# Patient Record
Sex: Male | Born: 1965 | Race: White | Hispanic: No | Marital: Married | Smoking: Former smoker
Health system: Southern US, Community
[De-identification: ages and names within clinical notes are randomized; demographics above are authoritative.]

## PROBLEM LIST (undated history)

## (undated) DIAGNOSIS — D638 Anemia in other chronic diseases classified elsewhere: Secondary | ICD-10-CM

## (undated) DIAGNOSIS — L8990 Pressure ulcer of unspecified site, unspecified stage: Secondary | ICD-10-CM

## (undated) DIAGNOSIS — F329 Major depressive disorder, single episode, unspecified: Secondary | ICD-10-CM

## (undated) DIAGNOSIS — J869 Pyothorax without fistula: Secondary | ICD-10-CM

## (undated) DIAGNOSIS — J852 Abscess of lung without pneumonia: Secondary | ICD-10-CM

## (undated) DIAGNOSIS — F32A Depression, unspecified: Secondary | ICD-10-CM

## (undated) DIAGNOSIS — I10 Essential (primary) hypertension: Secondary | ICD-10-CM

## (undated) DIAGNOSIS — S069X9A Unspecified intracranial injury with loss of consciousness of unspecified duration, initial encounter: Secondary | ICD-10-CM

## (undated) DIAGNOSIS — G8220 Paraplegia, unspecified: Secondary | ICD-10-CM

## (undated) HISTORY — PX: CHOLECYSTECTOMY: SHX55

## (undated) HISTORY — PX: COLOSTOMY: SHX63

---

## 2015-07-22 DIAGNOSIS — G8220 Paraplegia, unspecified: Secondary | ICD-10-CM

## 2015-07-22 DIAGNOSIS — S069XAA Unspecified intracranial injury with loss of consciousness status unknown, initial encounter: Secondary | ICD-10-CM

## 2015-07-22 DIAGNOSIS — S069X9A Unspecified intracranial injury with loss of consciousness of unspecified duration, initial encounter: Secondary | ICD-10-CM

## 2015-07-22 HISTORY — DX: Unspecified intracranial injury with loss of consciousness of unspecified duration, initial encounter: S06.9X9A

## 2015-07-22 HISTORY — DX: Unspecified intracranial injury with loss of consciousness status unknown, initial encounter: S06.9XAA

## 2015-07-22 HISTORY — DX: Paraplegia, unspecified: G82.20

## 2015-07-30 ENCOUNTER — Emergency Department (HOSPITAL_COMMUNITY): Payer: BLUE CROSS/BLUE SHIELD

## 2015-07-30 ENCOUNTER — Encounter (HOSPITAL_COMMUNITY): Payer: Self-pay | Admitting: Radiology

## 2015-07-30 ENCOUNTER — Inpatient Hospital Stay (HOSPITAL_COMMUNITY)
Admission: EM | Admit: 2015-07-30 | Discharge: 2015-09-20 | DRG: 003 | Disposition: A | Discharge status: Skilled Nursing Facility | Payer: BLUE CROSS/BLUE SHIELD | Attending: General Surgery | Admitting: General Surgery

## 2015-07-30 ENCOUNTER — Inpatient Hospital Stay (HOSPITAL_COMMUNITY): Payer: BLUE CROSS/BLUE SHIELD

## 2015-07-30 DIAGNOSIS — J9621 Acute and chronic respiratory failure with hypoxia: Secondary | ICD-10-CM | POA: Diagnosis present

## 2015-07-30 DIAGNOSIS — G9611 Dural tear: Secondary | ICD-10-CM | POA: Diagnosis present

## 2015-07-30 DIAGNOSIS — S32048A Other fracture of fourth lumbar vertebra, initial encounter for closed fracture: Secondary | ICD-10-CM | POA: Diagnosis present

## 2015-07-30 DIAGNOSIS — R339 Retention of urine, unspecified: Secondary | ICD-10-CM | POA: Diagnosis not present

## 2015-07-30 DIAGNOSIS — S0282XA Fracture of other specified skull and facial bones, left side, initial encounter for closed fracture: Secondary | ICD-10-CM | POA: Diagnosis present

## 2015-07-30 DIAGNOSIS — S32029A Unspecified fracture of second lumbar vertebra, initial encounter for closed fracture: Secondary | ICD-10-CM | POA: Diagnosis present

## 2015-07-30 DIAGNOSIS — S2243XA Multiple fractures of ribs, bilateral, initial encounter for closed fracture: Secondary | ICD-10-CM | POA: Diagnosis present

## 2015-07-30 DIAGNOSIS — F1010 Alcohol abuse, uncomplicated: Secondary | ICD-10-CM | POA: Diagnosis present

## 2015-07-30 DIAGNOSIS — E876 Hypokalemia: Secondary | ICD-10-CM | POA: Diagnosis not present

## 2015-07-30 DIAGNOSIS — I4891 Unspecified atrial fibrillation: Secondary | ICD-10-CM | POA: Diagnosis not present

## 2015-07-30 DIAGNOSIS — S37012A Minor contusion of left kidney, initial encounter: Secondary | ICD-10-CM | POA: Diagnosis present

## 2015-07-30 DIAGNOSIS — I10 Essential (primary) hypertension: Secondary | ICD-10-CM | POA: Diagnosis not present

## 2015-07-30 DIAGNOSIS — B958 Unspecified staphylococcus as the cause of diseases classified elsewhere: Secondary | ICD-10-CM | POA: Diagnosis not present

## 2015-07-30 DIAGNOSIS — G8221 Paraplegia, complete: Secondary | ICD-10-CM

## 2015-07-30 DIAGNOSIS — S27322A Contusion of lung, bilateral, initial encounter: Secondary | ICD-10-CM | POA: Diagnosis present

## 2015-07-30 DIAGNOSIS — R569 Unspecified convulsions: Secondary | ICD-10-CM | POA: Diagnosis not present

## 2015-07-30 DIAGNOSIS — S22068A Other fracture of T7-T8 thoracic vertebra, initial encounter for closed fracture: Secondary | ICD-10-CM | POA: Diagnosis present

## 2015-07-30 DIAGNOSIS — S22089A Unspecified fracture of T11-T12 vertebra, initial encounter for closed fracture: Secondary | ICD-10-CM | POA: Diagnosis present

## 2015-07-30 DIAGNOSIS — R509 Fever, unspecified: Secondary | ICD-10-CM

## 2015-07-30 DIAGNOSIS — G8220 Paraplegia, unspecified: Secondary | ICD-10-CM | POA: Diagnosis not present

## 2015-07-30 DIAGNOSIS — S82145A Nondisplaced bicondylar fracture of left tibia, initial encounter for closed fracture: Secondary | ICD-10-CM | POA: Diagnosis present

## 2015-07-30 DIAGNOSIS — Z23 Encounter for immunization: Secondary | ICD-10-CM

## 2015-07-30 DIAGNOSIS — L89153 Pressure ulcer of sacral region, stage 3: Secondary | ICD-10-CM | POA: Diagnosis not present

## 2015-07-30 DIAGNOSIS — R402112 Coma scale, eyes open, never, at arrival to emergency department: Secondary | ICD-10-CM | POA: Diagnosis present

## 2015-07-30 DIAGNOSIS — S065X9A Traumatic subdural hemorrhage with loss of consciousness of unspecified duration, initial encounter: Secondary | ICD-10-CM | POA: Diagnosis present

## 2015-07-30 DIAGNOSIS — I609 Nontraumatic subarachnoid hemorrhage, unspecified: Secondary | ICD-10-CM | POA: Diagnosis present

## 2015-07-30 DIAGNOSIS — S32058A Other fracture of fifth lumbar vertebra, initial encounter for closed fracture: Secondary | ICD-10-CM | POA: Diagnosis present

## 2015-07-30 DIAGNOSIS — R739 Hyperglycemia, unspecified: Secondary | ICD-10-CM | POA: Diagnosis not present

## 2015-07-30 DIAGNOSIS — S8990XA Unspecified injury of unspecified lower leg, initial encounter: Secondary | ICD-10-CM

## 2015-07-30 DIAGNOSIS — J810 Acute pulmonary edema: Secondary | ICD-10-CM | POA: Diagnosis not present

## 2015-07-30 DIAGNOSIS — S01311A Laceration without foreign body of right ear, initial encounter: Secondary | ICD-10-CM | POA: Diagnosis present

## 2015-07-30 DIAGNOSIS — N280 Ischemia and infarction of kidney: Secondary | ICD-10-CM | POA: Diagnosis present

## 2015-07-30 DIAGNOSIS — S32038A Other fracture of third lumbar vertebra, initial encounter for closed fracture: Secondary | ICD-10-CM | POA: Diagnosis present

## 2015-07-30 DIAGNOSIS — S066X9A Traumatic subarachnoid hemorrhage with loss of consciousness of unspecified duration, initial encounter: Secondary | ICD-10-CM | POA: Diagnosis present

## 2015-07-30 DIAGNOSIS — S299XXA Unspecified injury of thorax, initial encounter: Secondary | ICD-10-CM

## 2015-07-30 DIAGNOSIS — R4020 Unspecified coma: Secondary | ICD-10-CM | POA: Diagnosis present

## 2015-07-30 DIAGNOSIS — N179 Acute kidney failure, unspecified: Secondary | ICD-10-CM | POA: Diagnosis present

## 2015-07-30 DIAGNOSIS — G9340 Encephalopathy, unspecified: Secondary | ICD-10-CM | POA: Diagnosis not present

## 2015-07-30 DIAGNOSIS — J9601 Acute respiratory failure with hypoxia: Secondary | ICD-10-CM | POA: Diagnosis not present

## 2015-07-30 DIAGNOSIS — S32012A Unstable burst fracture of first lumbar vertebra, initial encounter for closed fracture: Secondary | ICD-10-CM | POA: Diagnosis present

## 2015-07-30 DIAGNOSIS — T1490XA Injury, unspecified, initial encounter: Secondary | ICD-10-CM

## 2015-07-30 DIAGNOSIS — R251 Tremor, unspecified: Secondary | ICD-10-CM | POA: Diagnosis not present

## 2015-07-30 DIAGNOSIS — G839 Paralytic syndrome, unspecified: Secondary | ICD-10-CM | POA: Diagnosis present

## 2015-07-30 DIAGNOSIS — S0240FA Zygomatic fracture, left side, initial encounter for closed fracture: Secondary | ICD-10-CM | POA: Diagnosis present

## 2015-07-30 DIAGNOSIS — Z9289 Personal history of other medical treatment: Secondary | ICD-10-CM

## 2015-07-30 DIAGNOSIS — S36039A Unspecified laceration of spleen, initial encounter: Secondary | ICD-10-CM | POA: Diagnosis present

## 2015-07-30 DIAGNOSIS — F10239 Alcohol dependence with withdrawal, unspecified: Secondary | ICD-10-CM | POA: Diagnosis present

## 2015-07-30 DIAGNOSIS — Z4659 Encounter for fitting and adjustment of other gastrointestinal appliance and device: Secondary | ICD-10-CM

## 2015-07-30 DIAGNOSIS — F17210 Nicotine dependence, cigarettes, uncomplicated: Secondary | ICD-10-CM | POA: Diagnosis present

## 2015-07-30 DIAGNOSIS — S34101A Unspecified injury to L1 level of lumbar spinal cord, initial encounter: Secondary | ICD-10-CM | POA: Diagnosis present

## 2015-07-30 DIAGNOSIS — S82143A Displaced bicondylar fracture of unspecified tibia, initial encounter for closed fracture: Secondary | ICD-10-CM

## 2015-07-30 DIAGNOSIS — S12500A Unspecified displaced fracture of sixth cervical vertebra, initial encounter for closed fracture: Secondary | ICD-10-CM | POA: Diagnosis present

## 2015-07-30 DIAGNOSIS — R402212 Coma scale, best verbal response, none, at arrival to emergency department: Secondary | ICD-10-CM | POA: Diagnosis present

## 2015-07-30 DIAGNOSIS — S22048A Other fracture of fourth thoracic vertebra, initial encounter for closed fracture: Secondary | ICD-10-CM | POA: Diagnosis present

## 2015-07-30 DIAGNOSIS — S0292XA Unspecified fracture of facial bones, initial encounter for closed fracture: Secondary | ICD-10-CM | POA: Diagnosis present

## 2015-07-30 DIAGNOSIS — E875 Hyperkalemia: Secondary | ICD-10-CM | POA: Diagnosis not present

## 2015-07-30 DIAGNOSIS — S34109A Unspecified injury to unspecified level of lumbar spinal cord, initial encounter: Secondary | ICD-10-CM

## 2015-07-30 DIAGNOSIS — S069XAA Unspecified intracranial injury with loss of consciousness status unknown, initial encounter: Secondary | ICD-10-CM | POA: Diagnosis present

## 2015-07-30 DIAGNOSIS — L8990 Pressure ulcer of unspecified site, unspecified stage: Secondary | ICD-10-CM | POA: Diagnosis not present

## 2015-07-30 DIAGNOSIS — N390 Urinary tract infection, site not specified: Secondary | ICD-10-CM | POA: Diagnosis not present

## 2015-07-30 DIAGNOSIS — Z930 Tracheostomy status: Secondary | ICD-10-CM

## 2015-07-30 DIAGNOSIS — S12501A Unspecified nondisplaced fracture of sixth cervical vertebra, initial encounter for closed fracture: Secondary | ICD-10-CM | POA: Diagnosis present

## 2015-07-30 DIAGNOSIS — R402312 Coma scale, best motor response, none, at arrival to emergency department: Secondary | ICD-10-CM | POA: Diagnosis present

## 2015-07-30 DIAGNOSIS — T794XXA Traumatic shock, initial encounter: Secondary | ICD-10-CM | POA: Diagnosis present

## 2015-07-30 DIAGNOSIS — R0989 Other specified symptoms and signs involving the circulatory and respiratory systems: Secondary | ICD-10-CM

## 2015-07-30 DIAGNOSIS — S069X6A Unspecified intracranial injury with loss of consciousness greater than 24 hours without return to pre-existing conscious level with patient surviving, initial encounter: Secondary | ICD-10-CM | POA: Diagnosis not present

## 2015-07-30 DIAGNOSIS — Z419 Encounter for procedure for purposes other than remedying health state, unspecified: Secondary | ICD-10-CM

## 2015-07-30 DIAGNOSIS — S32009A Unspecified fracture of unspecified lumbar vertebra, initial encounter for closed fracture: Secondary | ICD-10-CM | POA: Diagnosis present

## 2015-07-30 DIAGNOSIS — J9600 Acute respiratory failure, unspecified whether with hypoxia or hypercapnia: Secondary | ICD-10-CM | POA: Diagnosis present

## 2015-07-30 DIAGNOSIS — S069X9A Unspecified intracranial injury with loss of consciousness of unspecified duration, initial encounter: Secondary | ICD-10-CM

## 2015-07-30 DIAGNOSIS — S22008A Other fracture of unspecified thoracic vertebra, initial encounter for closed fracture: Secondary | ICD-10-CM | POA: Diagnosis present

## 2015-07-30 DIAGNOSIS — L89154 Pressure ulcer of sacral region, stage 4: Secondary | ICD-10-CM | POA: Diagnosis not present

## 2015-07-30 DIAGNOSIS — S32028A Other fracture of second lumbar vertebra, initial encounter for closed fracture: Secondary | ICD-10-CM | POA: Diagnosis present

## 2015-07-30 DIAGNOSIS — S0101XA Laceration without foreign body of scalp, initial encounter: Secondary | ICD-10-CM | POA: Diagnosis present

## 2015-07-30 DIAGNOSIS — S82142A Displaced bicondylar fracture of left tibia, initial encounter for closed fracture: Secondary | ICD-10-CM | POA: Diagnosis present

## 2015-07-30 DIAGNOSIS — Z09 Encounter for follow-up examination after completed treatment for conditions other than malignant neoplasm: Secondary | ICD-10-CM

## 2015-07-30 DIAGNOSIS — E870 Hyperosmolality and hypernatremia: Secondary | ICD-10-CM | POA: Diagnosis not present

## 2015-07-30 DIAGNOSIS — S36030A Superficial (capsular) laceration of spleen, initial encounter: Secondary | ICD-10-CM | POA: Diagnosis present

## 2015-07-30 DIAGNOSIS — J9690 Respiratory failure, unspecified, unspecified whether with hypoxia or hypercapnia: Secondary | ICD-10-CM

## 2015-07-30 DIAGNOSIS — D62 Acute posthemorrhagic anemia: Secondary | ICD-10-CM | POA: Diagnosis not present

## 2015-07-30 DIAGNOSIS — J690 Pneumonitis due to inhalation of food and vomit: Secondary | ICD-10-CM | POA: Diagnosis not present

## 2015-07-30 DIAGNOSIS — Z01818 Encounter for other preprocedural examination: Secondary | ICD-10-CM

## 2015-07-30 DIAGNOSIS — S270XXA Traumatic pneumothorax, initial encounter: Secondary | ICD-10-CM | POA: Diagnosis present

## 2015-07-30 DIAGNOSIS — S22078A Other fracture of T9-T10 vertebra, initial encounter for closed fracture: Secondary | ICD-10-CM | POA: Diagnosis present

## 2015-07-30 DIAGNOSIS — J189 Pneumonia, unspecified organism: Secondary | ICD-10-CM | POA: Diagnosis not present

## 2015-07-30 DIAGNOSIS — A498 Other bacterial infections of unspecified site: Secondary | ICD-10-CM | POA: Diagnosis not present

## 2015-07-30 LAB — I-STAT CHEM 8, ED
BUN: 10 mg/dL (ref 6–20)
CALCIUM ION: 0.95 mmol/L — AB (ref 1.12–1.23)
Chloride: 102 mmol/L (ref 101–111)
Creatinine, Ser: 1.2 mg/dL (ref 0.61–1.24)
GLUCOSE: 141 mg/dL — AB (ref 65–99)
HCT: 49 % (ref 39.0–52.0)
HEMOGLOBIN: 16.7 g/dL (ref 13.0–17.0)
POTASSIUM: 3.3 mmol/L — AB (ref 3.5–5.1)
Sodium: 136 mmol/L (ref 135–145)
TCO2: 18 mmol/L (ref 0–100)

## 2015-07-30 LAB — ABO/RH: ABO/RH(D): A POS

## 2015-07-30 LAB — I-STAT ARTERIAL BLOOD GAS, ED
ACID-BASE DEFICIT: 11 mmol/L — AB (ref 0.0–2.0)
Acid-base deficit: 13 mmol/L — ABNORMAL HIGH (ref 0.0–2.0)
BICARBONATE: 14.3 meq/L — AB (ref 20.0–24.0)
Bicarbonate: 17.6 meq/L — ABNORMAL LOW (ref 20.0–24.0)
O2 SAT: 100 %
O2 Saturation: 99 %
PCO2 ART: 46.6 mmHg — AB (ref 35.0–45.0)
PH ART: 7.175 — AB (ref 7.350–7.450)
PH ART: 7.221 — AB (ref 7.350–7.450)
Patient temperature: 95.2
TCO2: 19 mmol/L (ref 0–100)
TCO2: 15 mmol/L (ref 0–100)
pCO2 arterial: 34 mmHg — ABNORMAL LOW (ref 35.0–45.0)
pO2, Arterial: 257 mmHg — ABNORMAL HIGH (ref 80.0–100.0)
pO2, Arterial: 173 mmHg — ABNORMAL HIGH (ref 80.0–100.0)

## 2015-07-30 LAB — BLOOD PRODUCT ORDER (VERBAL) VERIFICATION

## 2015-07-30 LAB — I-STAT CG4 LACTIC ACID, ED: LACTIC ACID, VENOUS: 3.61 mmol/L — AB (ref 0.5–2.0)

## 2015-07-30 LAB — PREPARE RBC (CROSSMATCH)

## 2015-07-30 LAB — I-STAT TROPONIN, ED: Troponin i, poc: 0.01 ng/mL (ref 0.00–0.08)

## 2015-07-30 LAB — MRSA PCR SCREENING: MRSA BY PCR: NEGATIVE

## 2015-07-30 LAB — CDS SEROLOGY

## 2015-07-30 MED ORDER — FENTANYL CITRATE (PF) 100 MCG/2ML IJ SOLN
100.0000 ug | INTRAMUSCULAR | Status: DC | PRN
  Administered 2015-07-30: 100 ug via INTRAVENOUS
  Filled 2015-07-30: qty 2

## 2015-07-30 MED ORDER — FENTANYL CITRATE (PF) 100 MCG/2ML IJ SOLN
INTRAMUSCULAR | Status: AC
  Administered 2015-07-30: 25 ug
  Filled 2015-07-30: qty 2

## 2015-07-30 MED ORDER — SODIUM CHLORIDE 0.9 % IV SOLN: 10.0000 mL/h | Freq: Once | INTRAVENOUS | Status: DC

## 2015-07-30 MED ORDER — TETANUS-DIPHTH-ACELL PERTUSSIS 5-2.5-18.5 LF-MCG/0.5 IM SUSP
INTRAMUSCULAR | Status: AC
  Filled 2015-07-30: qty 0.5

## 2015-07-30 MED ORDER — ANTISEPTIC ORAL RINSE SOLUTION (CORINZ)
7.0000 mL | OROMUCOSAL | Status: DC
  Administered 2015-07-30 – 2015-09-20 (×478): 7 mL via OROMUCOSAL

## 2015-07-30 MED ORDER — SODIUM CHLORIDE 0.9 % IV SOLN
0.0000 ug/min | INTRAVENOUS | Status: DC
  Administered 2015-07-30: 125 ug/min via INTRAVENOUS
  Administered 2015-07-30: 100 ug/min via INTRAVENOUS
  Administered 2015-07-31 – 2015-08-01 (×2): 40 ug/min via INTRAVENOUS
  Administered 2015-08-02: 15 ug/min via INTRAVENOUS
  Filled 2015-07-30 (×5): qty 4

## 2015-07-30 MED ORDER — ONDANSETRON HCL 4 MG PO TABS: 4.0000 mg | ORAL_TABLET | Freq: Four times a day (QID) | ORAL | Status: DC | PRN

## 2015-07-30 MED ORDER — FENTANYL CITRATE (PF) 100 MCG/2ML IJ SOLN
INTRAMUSCULAR | Status: AC
  Filled 2015-07-30: qty 2

## 2015-07-30 MED ORDER — MIDAZOLAM HCL 2 MG/2ML IJ SOLN
INTRAMUSCULAR | Status: AC
  Filled 2015-07-30: qty 4

## 2015-07-30 MED ORDER — PROPOFOL 1000 MG/100ML IV EMUL
5.0000 ug/kg/min | Freq: Once | INTRAVENOUS | Status: AC
Start: 2015-07-30 — End: 2015-07-30
  Administered 2015-07-30: 5 ug/kg/min via INTRAVENOUS

## 2015-07-30 MED ORDER — TETANUS-DIPHTH-ACELL PERTUSSIS 5-2.5-18.5 LF-MCG/0.5 IM SUSP
0.5000 mL | Freq: Once | INTRAMUSCULAR | Status: AC
  Administered 2015-07-30: 0.5 mL via INTRAMUSCULAR

## 2015-07-30 MED ORDER — SODIUM CHLORIDE 0.9% FLUSH
10.0000 mL | Freq: Two times a day (BID) | INTRAVENOUS | Status: DC
  Administered 2015-07-30 (×2): 10 mL
  Administered 2015-07-31: 20 mL
  Administered 2015-08-01 – 2015-08-03 (×5): 10 mL
  Administered 2015-08-04: 20 mL
  Administered 2015-08-04: 10 mL
  Administered 2015-08-05: 40 mL
  Administered 2015-08-06: 10 mL
  Administered 2015-08-06: 40 mL
  Administered 2015-08-07 – 2015-08-12 (×12): 10 mL
  Administered 2015-08-13: 30 mL
  Administered 2015-08-14: 10 mL
  Administered 2015-08-14: 20 mL
  Administered 2015-08-15 – 2015-08-18 (×7): 10 mL
  Administered 2015-08-18: 30 mL
  Administered 2015-08-19: 40 mL
  Administered 2015-08-19 – 2015-08-20 (×2): 10 mL
  Administered 2015-08-20 – 2015-08-21 (×2): 30 mL
  Administered 2015-08-21 – 2015-08-25 (×8): 10 mL
  Administered 2015-08-26: 20 mL
  Administered 2015-08-26: 10 mL
  Administered 2015-08-27: 20 mL
  Administered 2015-08-27 – 2015-08-29 (×5): 10 mL
  Administered 2015-08-30: 30 mL
  Administered 2015-08-30: 10 mL
  Administered 2015-08-31: 30 mL
  Administered 2015-08-31 – 2015-09-18 (×19): 10 mL

## 2015-07-30 MED ORDER — CHLORHEXIDINE GLUCONATE 0.12% ORAL RINSE (MEDLINE KIT)
15.0000 mL | Freq: Two times a day (BID) | OROMUCOSAL | Status: DC
  Administered 2015-07-30 – 2015-09-01 (×66): 15 mL via OROMUCOSAL

## 2015-07-30 MED ORDER — PHENYLEPHRINE HCL 10 MG/ML IJ SOLN
INTRAMUSCULAR | Status: AC | PRN
  Administered 2015-07-30: 80 ug
  Administered 2015-07-30: 40 ug

## 2015-07-30 MED ORDER — SODIUM CHLORIDE 0.9 % IV BOLUS (SEPSIS)
1000.0000 mL | Freq: Once | INTRAVENOUS | Status: AC
  Administered 2015-07-30: 1000 mL via INTRAVENOUS

## 2015-07-30 MED ORDER — MIDAZOLAM HCL 2 MG/2ML IJ SOLN
2.0000 mg | INTRAMUSCULAR | Status: DC | PRN
  Administered 2015-08-01 – 2015-08-17 (×18): 2 mg via INTRAVENOUS
  Filled 2015-07-30 (×21): qty 2

## 2015-07-30 MED ORDER — PROPOFOL 1000 MG/100ML IV EMUL
INTRAVENOUS | Status: AC | PRN
  Administered 2015-07-30: 5 ug/kg/min via INTRAVENOUS

## 2015-07-30 MED ORDER — LIDOCAINE-EPINEPHRINE 2 %-1:200000 IJ SOLN
INTRAMUSCULAR | Status: AC
Start: 2015-07-30 — End: 2015-07-30
  Filled 2015-07-30: qty 20

## 2015-07-30 MED ORDER — MIDAZOLAM HCL 2 MG/2ML IJ SOLN
2.0000 mg | INTRAMUSCULAR | Status: AC | PRN
  Administered 2015-07-30 – 2015-08-02 (×3): 2 mg via INTRAVENOUS
  Filled 2015-07-30 (×3): qty 2

## 2015-07-30 MED ORDER — SODIUM CHLORIDE 0.9 % IV SOLN
INTRAVENOUS | Status: AC | PRN
  Administered 2015-07-30 (×3): 1000 mL via INTRAVENOUS

## 2015-07-30 MED ORDER — FENTANYL CITRATE (PF) 100 MCG/2ML IJ SOLN
100.0000 ug | INTRAMUSCULAR | Status: AC | PRN
  Administered 2015-07-30 – 2015-07-31 (×3): 100 ug via INTRAVENOUS
  Filled 2015-07-30 (×3): qty 2

## 2015-07-30 MED ORDER — ONDANSETRON HCL 4 MG/2ML IJ SOLN
4.0000 mg | Freq: Four times a day (QID) | INTRAMUSCULAR | Status: DC | PRN
  Administered 2015-08-23 – 2015-09-12 (×2): 4 mg via INTRAVENOUS
  Filled 2015-07-30 (×2): qty 2

## 2015-07-30 MED ORDER — FENTANYL CITRATE (PF) 100 MCG/2ML IJ SOLN: 100.0000 ug | INTRAMUSCULAR | Status: DC | PRN

## 2015-07-30 MED ORDER — SODIUM CHLORIDE 0.9 % IV SOLN
INTRAVENOUS | Status: DC
  Administered 2015-07-30 – 2015-08-02 (×6): via INTRAVENOUS
  Administered 2015-08-02: 1000 mL via INTRAVENOUS
  Administered 2015-08-03 (×2): via INTRAVENOUS
  Administered 2015-08-04: 1000 mL via INTRAVENOUS
  Administered 2015-08-04: 10:00:00 via INTRAVENOUS

## 2015-07-30 MED ORDER — DEXMEDETOMIDINE HCL IN NACL 200 MCG/50ML IV SOLN
0.2000 ug/kg/h | INTRAVENOUS | Status: AC
  Administered 2015-07-30 (×2): 0.7 ug/kg/h via INTRAVENOUS
  Administered 2015-07-30: 0.2 ug/kg/h via INTRAVENOUS
  Administered 2015-07-30 – 2015-07-31 (×6): 0.7 ug/kg/h via INTRAVENOUS
  Filled 2015-07-30 (×10): qty 50

## 2015-07-30 MED ORDER — ACETAMINOPHEN 650 MG RE SUPP
650.0000 mg | Freq: Four times a day (QID) | RECTAL | Status: DC | PRN
  Administered 2015-07-30: 650 mg via RECTAL
  Filled 2015-07-30: qty 1

## 2015-07-30 MED ORDER — SODIUM CHLORIDE 0.9% FLUSH
10.0000 mL | INTRAVENOUS | Status: DC | PRN
  Administered 2015-09-02 – 2015-09-06 (×4): 20 mL
  Administered 2015-09-11: 10 mL
  Administered 2015-09-14 – 2015-09-18 (×4): 20 mL
  Administered 2015-09-19: 30 mL
  Filled 2015-07-30 (×10): qty 40

## 2015-07-30 MED ORDER — FENTANYL CITRATE (PF) 100 MCG/2ML IJ SOLN
50.0000 ug | Freq: Once | INTRAMUSCULAR | Status: AC
  Administered 2015-07-30: 50 ug via INTRAVENOUS

## 2015-07-30 MED ORDER — PROPOFOL 1000 MG/100ML IV EMUL
INTRAVENOUS | Status: AC
  Administered 2015-07-30: 5 ug/kg/min via INTRAVENOUS
  Filled 2015-07-30: qty 100

## 2015-07-30 MED ORDER — SODIUM CHLORIDE 0.9 % IV SOLN
1.0000 g | Freq: Once | INTRAVENOUS | Status: AC
  Administered 2015-07-30: 1 g via INTRAVENOUS
  Filled 2015-07-30: qty 10

## 2015-07-30 MED ORDER — PANTOPRAZOLE SODIUM 40 MG PO TBEC
40.0000 mg | DELAYED_RELEASE_TABLET | Freq: Every day | ORAL | Status: DC
  Administered 2015-08-12 – 2015-08-20 (×4): 40 mg via ORAL
  Filled 2015-07-30 (×5): qty 1

## 2015-07-30 MED ORDER — ROCURONIUM BROMIDE 50 MG/5ML IV SOLN
INTRAVENOUS | Status: AC | PRN
  Administered 2015-07-30: 100 mg via INTRAVENOUS

## 2015-07-30 MED ORDER — NOREPINEPHRINE BITARTRATE 1 MG/ML IV SOLN
2.0000 ug/min | INTRAVENOUS | Status: DC
  Filled 2015-07-30: qty 4

## 2015-07-30 MED ORDER — IOPAMIDOL (ISOVUE-300) INJECTION 61%
INTRAVENOUS | Status: AC
  Filled 2015-07-30: qty 100

## 2015-07-30 MED ORDER — MIDAZOLAM HCL 5 MG/5ML IJ SOLN
INTRAMUSCULAR | Status: AC | PRN
  Administered 2015-07-30: 4 mg via INTRAVENOUS

## 2015-07-30 MED ORDER — MIDAZOLAM HCL 2 MG/2ML IJ SOLN
INTRAMUSCULAR | Status: AC
Start: 2015-07-30 — End: 2015-07-30
  Administered 2015-07-30: 2 mg
  Filled 2015-07-30: qty 2

## 2015-07-30 MED ORDER — PANTOPRAZOLE SODIUM 40 MG IV SOLR
40.0000 mg | Freq: Every day | INTRAVENOUS | Status: DC
  Administered 2015-07-30 – 2015-08-18 (×18): 40 mg via INTRAVENOUS
  Filled 2015-07-30 (×18): qty 40

## 2015-07-30 MED ORDER — SODIUM CHLORIDE 0.9 % IV SOLN
0.0000 ug/min | INTRAVENOUS | Status: DC
  Administered 2015-07-30: 180 ug/min via INTRAVENOUS
  Administered 2015-07-30: 20 ug/min via INTRAVENOUS
  Filled 2015-07-30 (×3): qty 1

## 2015-07-30 MED ORDER — FENTANYL CITRATE (PF) 100 MCG/2ML IJ SOLN
100.0000 ug | INTRAMUSCULAR | Status: DC | PRN
  Administered 2015-07-31 – 2015-08-14 (×62): 100 ug via INTRAVENOUS
  Administered 2015-08-15: 150 ug via INTRAVENOUS
  Administered 2015-08-15 (×3): 50 ug via INTRAVENOUS
  Administered 2015-08-15 – 2015-08-21 (×28): 100 ug via INTRAVENOUS
  Administered 2015-08-21: 50 ug via INTRAVENOUS
  Administered 2015-08-22 – 2015-08-31 (×36): 100 ug via INTRAVENOUS
  Filled 2015-07-30 (×131): qty 2

## 2015-07-30 NOTE — ED Notes (Signed)
See trauma narrator 
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
## 2015-07-30 NOTE — Progress Notes (Signed)
Patient ID: [name], unknown   DOB: October 23, 1965, 50 y.o.   MRN: [id number] I spoke with his wife at the bedside. I updated her on the plan of care and answered her questions. Violeta GelinasBurke Naveed Humphres, MD, MPH, FACS Trauma: [phone] General Surgery: [phone]
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
## 2015-07-30 NOTE — Progress Notes (Signed)
Arterial Catheter Insertion Procedure Note William NordmannDonald Wayne Logan [id number] April 18, 1966  Procedure: Insertion of Arterial Catheter  Indications: Blood pressure monitoring RT unable to thread a line in radial artery so femoral a line placed.  Procedure Details Consent: Unable to obtain consent because of emergent medical necessity. Time Out: Verified patient identification, verified procedure, site/side was marked, verified correct patient position, special equipment/implants available, medications/allergies/relevent history reviewed, required imaging and test results available.  Performed  Maximum sterile technique was used including antiseptics, cap, gloves, hand hygiene, mask and sheet. Skin prep: Chlorhexidine; 20 gauge catheter was inserted into left femoral artery using the Seldinger technique.  Evaluation Blood flow good; BP tracing good. Complications: No apparent complications.  William SellaEric M. William CampanileWilson, MD, FACS General, Bariatric, & Minimally Invasive Surgery [facility], [place]   [facility] HospitalWILSON,[name] 07/30/2015
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
## 2015-07-30 NOTE — Progress Notes (Signed)
Dr. Magnus IvanBlackman notified of spike and subsequent drop of patient's BP. Orders received for CBC and liter bolus. Will monitor.
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
## 2015-07-30 NOTE — Progress Notes (Signed)
Orthopedic Tech Progress Note Patient Details:  Julien NordmannDonald Wayne Dominy [birth year] [id number]  Ortho Devices Type of Ortho Device: Knee Immobilizer Ortho Device/Splint Interventions: Application   Saul FordyceJennifer C Marcelina Mclaurin 07/30/2015, 1:42 PM

## 2015-07-30 NOTE — Consult Note (Signed)
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 FLAGS AND/OR RANGES MODIFIED BY DEMOGRAPHIC UPDATE ON 04/09 AT 0311 QA FLAGS AND/OR RANGES MODIFIED BY DEMOGRAPHIC UPDATE ON 04/09 AT 0437 QA FLAGS AND/OR RANGES MODIFIED BY DEMOGRAPHIC UPDATE ON 04/09 AT 0510 QA FLAGS AND/OR RANGES MODIFIED BY DEMOGRAPHIC UPDATE ON 04/09 AT 0526    BUN 10 6 - 20 mg/dL    Comment: QA FLAGS AND/OR RANGES MODIFIED BY DEMOGRAPHIC UPDATE ON 04/09 AT 0245 QA FLAGS AND/OR RANGES MODIFIED BY DEMOGRAPHIC UPDATE ON 04/09 AT 0247 QA FLAGS AND/OR RANGES MODIFIED BY DEMOGRAPHIC UPDATE ON 04/09 AT 0311 QA FLAGS AND/OR RANGES MODIFIED BY DEMOGRAPHIC UPDATE ON 04/09 AT 0437 QA FLAGS AND/OR RANGES MODIFIED BY DEMOGRAPHIC UPDATE ON 04/09 AT 0510 QA FLAGS AND/OR RANGES MODIFIED BY DEMOGRAPHIC UPDATE ON 04/09 AT 0526    Creatinine, Ser 1.20 0.61 - 1.24 mg/dL    Comment: QA FLAGS AND/OR RANGES MODIFIED BY DEMOGRAPHIC UPDATE ON 04/09 AT 0245 QA FLAGS AND/OR RANGES MODIFIED BY  DEMOGRAPHIC UPDATE ON 04/09 AT 0247 QA FLAGS AND/OR RANGES MODIFIED BY DEMOGRAPHIC UPDATE ON 04/09 AT 0311 QA FLAGS AND/OR RANGES MODIFIED BY DEMOGRAPHIC UPDATE ON 04/09 AT 0437 QA FLAGS AND/OR RANGES MODIFIED BY DEMOGRAPHIC UPDATE ON 04/09 AT 0510 QA FLAGS AND/OR RANGES MODIFIED BY DEMOGRAPHIC UPDATE ON 04/09 AT 0526    Glucose, Bld 141 (H) 65 - 99 mg/dL    Comment: QA FLAGS AND/OR RANGES MODIFIED BY DEMOGRAPHIC UPDATE ON 04/09 AT 0245 QA FLAGS AND/OR RANGES MODIFIED BY DEMOGRAPHIC UPDATE ON 04/09 AT 0247 QA FLAGS AND/OR RANGES MODIFIED BY DEMOGRAPHIC UPDATE ON 04/09 AT 0311 QA FLAGS AND/OR RANGES MODIFIED BY DEMOGRAPHIC UPDATE ON 04/09 AT 0437 QA FLAGS AND/OR RANGES MODIFIED BY DEMOGRAPHIC UPDATE ON 04/09 AT 0510 QA FLAGS AND/OR RANGES MODIFIED BY DEMOGRAPHIC UPDATE ON 04/09 AT 0526    Calcium, Ion 0.95 (L) 1.12 - 1.23 mmol/L    Comment: QA FLAGS AND/OR RANGES MODIFIED BY DEMOGRAPHIC UPDATE ON 04/09 AT 4621 QA FLAGS AND/OR RANGES MODIFIED  BY DEMOGRAPHIC UPDATE ON 04/09 AT 0245 QA FLAGS AND/OR RANGES MODIFIED BY DEMOGRAPHIC UPDATE ON 04/09 AT 0247 QA FLAGS AND/OR RANGES MODIFIED BY DEMOGRAPHIC UPDATE ON 04/09 AT 0311 QA FLAGS AND/OR RANGES MODIFIED BY DEMOGRAPHIC UPDATE ON 04/09 AT 0437 QA FLAGS AND/OR RANGES MODIFIED BY DEMOGRAPHIC UPDATE ON 04/09 AT 0510 QA FLAGS AND/OR RANGES MODIFIED BY DEMOGRAPHIC UPDATE ON 04/09 AT 0526    TCO2 18 0 - 100 mmol/L    Comment: QA FLAGS AND/OR RANGES MODIFIED BY DEMOGRAPHIC UPDATE ON 04/09 AT 0245 QA FLAGS AND/OR RANGES MODIFIED BY DEMOGRAPHIC UPDATE ON 04/09 AT 0247 QA FLAGS AND/OR RANGES MODIFIED BY DEMOGRAPHIC UPDATE ON 04/09 AT 0311 QA FLAGS AND/OR RANGES MODIFIED BY DEMOGRAPHIC UPDATE ON 04/09 AT 0437 QA FLAGS AND/OR RANGES MODIFIED BY DEMOGRAPHIC UPDATE ON 04/09 AT 0510 QA FLAGS AND/OR RANGES MODIFIED BY DEMOGRAPHIC UPDATE ON 04/09 AT 0526    Hemoglobin 16.7 13.0 - 17.0 g/dL    Comment: QA FLAGS AND/OR RANGES MODIFIED BY DEMOGRAPHIC UPDATE ON  04/09 AT 0245 QA FLAGS AND/OR RANGES MODIFIED BY DEMOGRAPHIC UPDATE ON 04/09 AT 0247 QA FLAGS AND/OR RANGES MODIFIED BY DEMOGRAPHIC UPDATE ON 04/09 AT 0311 QA FLAGS AND/OR RANGES MODIFIED BY DEMOGRAPHIC UPDATE ON 04/09 AT 0437 QA FLAGS AND/OR RANGES MODIFIED BY DEMOGRAPHIC UPDATE ON 04/09 AT 0510 QA FLAGS AND/OR RANGES MODIFIED BY DEMOGRAPHIC UPDATE ON 04/09 AT 0526    HCT 49.0 39.0 - 52.0 %    Comment: QA FLAGS AND/OR RANGES MODIFIED BY DEMOGRAPHIC UPDATE ON 04/09 AT 0245 QA FLAGS AND/OR RANGES MODIFIED BY DEMOGRAPHIC UPDATE ON 04/09 AT 0247 QA FLAGS AND/OR RANGES MODIFIED BY DEMOGRAPHIC UPDATE ON 04/09 AT 0311 QA FLAGS AND/OR RANGES MODIFIED BY DEMOGRAPHIC UPDATE ON 04/09 AT 0437 QA FLAGS AND/OR RANGES MODIFIED BY DEMOGRAPHIC UPDATE ON 04/09 AT 0510 QA FLAGS AND/OR RANGES MODIFIED BY DEMOGRAPHIC UPDATE ON 04/09 AT 0526   I-Stat CG4 Lactic Acid, ED     Status: Abnormal   Collection Time: 07/30/15  1:57 AM  Result Value Ref Range   Lactic Acid, Venous 3.61 (HH) 0.5 - 2.0 mmol/L    Comment: QA FLAGS AND/OR RANGES MODIFIED BY DEMOGRAPHIC UPDATE ON 04/09 AT 0245 QA FLAGS AND/OR RANGES MODIFIED BY DEMOGRAPHIC UPDATE ON 04/09 AT 0247 QA FLAGS AND/OR RANGES MODIFIED BY DEMOGRAPHIC UPDATE ON 04/09 AT 0311 QA FLAGS AND/OR RANGES MODIFIED BY DEMOGRAPHIC UPDATE ON 04/09 AT 0437 QA FLAGS AND/OR RANGES MODIFIED BY DEMOGRAPHIC UPDATE ON 04/09 AT 0510 QA FLAGS AND/OR RANGES MODIFIED BY DEMOGRAPHIC UPDATE ON 04/09 AT 0526    Comment NOTIFIED PHYSICIAN   I-STAT, chem 8     Status: Abnormal   Collection Time: 07/30/15  1:57 AM  Result Value Ref Range   Sodium 136 135 - 145 mmol/L    Comment: QA FLAGS AND/OR RANGES MODIFIED BY DEMOGRAPHIC UPDATE ON 04/09 AT 0245 QA FLAGS AND/OR RANGES MODIFIED BY DEMOGRAPHIC UPDATE ON 04/09 AT 0247 QA FLAGS AND/OR RANGES MODIFIED BY DEMOGRAPHIC UPDATE ON 04/09 AT 0311 QA FLAGS AND/OR RANGES MODIFIED BY DEMOGRAPHIC UPDATE ON 04/09 AT 0437 QA FLAGS AND/OR RANGES  MODIFIED BY DEMOGRAPHIC UPDATE ON 04/09 AT 0510 QA FLAGS AND/OR RANGES MODIFIED BY DEMOGRAPHIC UPDATE ON 04/09 AT 0526 QA FLAGS AND/OR RANGES MODIFIED BY DEMOGRAPHIC UPDATE ON 04/09 AT 0559 QA FLAGS AND/OR RANGES MODIFIED BY DEMOGRAPHIC UPDATE ON 04/09 AT 6483    Potassium 3.3 (L) 3.5 - 5.1 mmol/L    Comment: QA FLAGS AND/OR RANGES MODIFIED BY DEMOGRAPHIC UPDATE ON 04/09 AT  0245 QA FLAGS AND/OR RANGES MODIFIED BY DEMOGRAPHIC UPDATE ON 04/09 AT 0247 QA FLAGS AND/OR RANGES MODIFIED BY DEMOGRAPHIC UPDATE ON 04/09 AT 0311 QA FLAGS AND/OR RANGES MODIFIED BY DEMOGRAPHIC UPDATE ON 04/09 AT 0437 QA FLAGS AND/OR RANGES MODIFIED BY DEMOGRAPHIC UPDATE ON 04/09 AT 0510 QA FLAGS AND/OR RANGES MODIFIED BY DEMOGRAPHIC UPDATE ON 04/09 AT 0526 QA FLAGS AND/OR RANGES MODIFIED BY DEMOGRAPHIC UPDATE ON 04/09 AT 0559 QA FLAGS AND/OR RANGES MODIFIED BY DEMOGRAPHIC UPDATE ON 04/09 AT 0613    Chloride 102 101 - 111 mmol/L    Comment: QA FLAGS AND/OR RANGES MODIFIED BY DEMOGRAPHIC UPDATE ON 04/09 AT 0245 QA FLAGS AND/OR RANGES MODIFIED BY DEMOGRAPHIC UPDATE ON 04/09 AT 0247 QA FLAGS AND/OR RANGES MODIFIED BY DEMOGRAPHIC UPDATE ON 04/09 AT 0311 QA FLAGS AND/OR RANGES MODIFIED BY DEMOGRAPHIC UPDATE ON 04/09 AT 0437 QA FLAGS AND/OR RANGES MODIFIED BY DEMOGRAPHIC UPDATE ON 04/09 AT 0510 QA FLAGS AND/OR RANGES MODIFIED BY DEMOGRAPHIC UPDATE ON 04/09 AT 0526 QA FLAGS AND/OR RANGES MODIFIED BY DEMOGRAPHIC UPDATE ON 04/09 AT 0559 QA FLAGS AND/OR RANGES MODIFIED BY DEMOGRAPHIC UPDATE ON 04/09 AT 0613    BUN 10 6 - 20 mg/dL    Comment: QA FLAGS AND/OR RANGES MODIFIED BY DEMOGRAPHIC UPDATE ON 04/09 AT 0245 QA FLAGS AND/OR RANGES MODIFIED BY DEMOGRAPHIC UPDATE ON 04/09 AT 0247 QA FLAGS AND/OR RANGES MODIFIED BY DEMOGRAPHIC UPDATE ON 04/09 AT 0311 QA FLAGS AND/OR RANGES MODIFIED BY DEMOGRAPHIC UPDATE ON 04/09 AT 0437 QA FLAGS AND/OR RANGES MODIFIED BY DEMOGRAPHIC UPDATE ON 04/09 AT 0510 QA FLAGS AND/OR RANGES MODIFIED BY  DEMOGRAPHIC UPDATE ON 04/09 AT 0526 QA FLAGS AND/OR RANGES MODIFIED BY DEMOGRAPHIC UPDATE ON 04/09 AT 0559 QA FLAGS AND/OR RANGES MODIFIED BY DEMOGRAPHIC UPDATE ON 04/09 AT 0613    Creatinine, Ser 1.20 0.61 - 1.24 mg/dL    Comment: QA FLAGS AND/OR RANGES MODIFIED BY DEMOGRAPHIC UPDATE ON 04/09 AT 0245 QA FLAGS AND/OR RANGES MODIFIED BY DEMOGRAPHIC UPDATE ON 04/09 AT 0247 QA FLAGS AND/OR RANGES MODIFIED BY DEMOGRAPHIC UPDATE ON 04/09 AT 0311 QA FLAGS AND/OR RANGES MODIFIED BY DEMOGRAPHIC UPDATE ON 04/09 AT 0437 QA FLAGS AND/OR RANGES MODIFIED BY DEMOGRAPHIC UPDATE ON 04/09 AT 0510 QA FLAGS AND/OR RANGES MODIFIED BY DEMOGRAPHIC UPDATE ON 04/09 AT 0526 QA FLAGS AND/OR RANGES MODIFIED BY DEMOGRAPHIC UPDATE ON 04/09 AT 0559 QA FLAGS AND/OR RANGES MODIFIED BY DEMOGRAPHIC UPDATE ON 04/09 AT 0613    Glucose, Bld 141 (H) 65 - 99 mg/dL    Comment: QA FLAGS AND/OR RANGES MODIFIED BY DEMOGRAPHIC UPDATE ON 04/09 AT 0245 QA FLAGS AND/OR RANGES MODIFIED BY DEMOGRAPHIC UPDATE ON 04/09 AT 0247 QA FLAGS AND/OR RANGES MODIFIED BY DEMOGRAPHIC UPDATE ON 04/09 AT 0311 QA FLAGS AND/OR RANGES MODIFIED BY DEMOGRAPHIC UPDATE ON 04/09 AT 0437 QA FLAGS AND/OR RANGES MODIFIED BY DEMOGRAPHIC UPDATE ON 04/09 AT 0510 QA FLAGS AND/OR RANGES MODIFIED BY DEMOGRAPHIC UPDATE ON 04/09 AT 0526 QA FLAGS AND/OR RANGES MODIFIED BY DEMOGRAPHIC UPDATE ON 04/09 AT 0559 QA FLAGS AND/OR RANGES MODIFIED BY DEMOGRAPHIC UPDATE ON 04/09 AT 0613    Calcium, Ion 0.95 (L) 1.12 - 1.23 mmol/L    Comment: QA FLAGS AND/OR RANGES MODIFIED BY DEMOGRAPHIC UPDATE ON 04/09 AT 0237 QA FLAGS AND/OR RANGES MODIFIED BY DEMOGRAPHIC UPDATE ON 04/09 AT 0245 QA FLAGS AND/OR RANGES MODIFIED BY DEMOGRAPHIC UPDATE ON 04/09 AT 0247 QA FLAGS AND/OR RANGES MODIFIED BY DEMOGRAPHIC UPDATE ON 04/09 AT 0311 QA FLAGS AND/OR RANGES MODIFIED BY DEMOGRAPHIC UPDATE ON 04/09 AT 7308  QA FLAGS AND/OR RANGES MODIFIED BY DEMOGRAPHIC UPDATE ON 04/09 AT 0510 QA FLAGS AND/OR  RANGES MODIFIED BY DEMOGRAPHIC UPDATE ON 04/09 AT 0526 QA FLAGS AND/OR RANGES MODIFIED BY DEMOGRAPHIC UPDATE ON 04/09 AT 0559 QA FLAGS AND/OR RANGES MODIFIED BY DEMOGRAPHIC UPDATE ON 04/09 AT 0613    TCO2 18 0 - 100 mmol/L    Comment: QA FLAGS AND/OR RANGES MODIFIED BY DEMOGRAPHIC UPDATE ON 04/09 AT 0245 QA FLAGS AND/OR RANGES MODIFIED BY DEMOGRAPHIC UPDATE ON 04/09 AT 0247 QA FLAGS AND/OR RANGES MODIFIED BY DEMOGRAPHIC UPDATE ON 04/09 AT 0311 QA FLAGS AND/OR RANGES MODIFIED BY DEMOGRAPHIC UPDATE ON 04/09 AT 0437 QA FLAGS AND/OR RANGES MODIFIED BY DEMOGRAPHIC UPDATE ON 04/09 AT 0510 QA FLAGS AND/OR RANGES MODIFIED BY DEMOGRAPHIC UPDATE ON 04/09 AT 0526 QA FLAGS AND/OR RANGES MODIFIED BY DEMOGRAPHIC UPDATE ON 04/09 AT 0559 QA FLAGS AND/OR RANGES MODIFIED BY DEMOGRAPHIC UPDATE ON 04/09 AT 0613    Hemoglobin 16.7 13.0 - 17.0 g/dL    Comment: QA FLAGS AND/OR RANGES MODIFIED BY DEMOGRAPHIC UPDATE ON 04/09 AT 0245 QA FLAGS AND/OR RANGES MODIFIED BY DEMOGRAPHIC UPDATE ON 04/09 AT 0247 QA FLAGS AND/OR RANGES MODIFIED BY DEMOGRAPHIC UPDATE ON 04/09 AT 0311 QA FLAGS AND/OR RANGES MODIFIED BY DEMOGRAPHIC UPDATE ON 04/09 AT 0437 QA FLAGS AND/OR RANGES MODIFIED BY DEMOGRAPHIC UPDATE ON 04/09 AT 0510 QA FLAGS AND/OR RANGES MODIFIED BY DEMOGRAPHIC UPDATE ON 04/09 AT 0526 QA FLAGS AND/OR RANGES MODIFIED BY DEMOGRAPHIC UPDATE ON 04/09 AT 0559 QA FLAGS AND/OR RANGES MODIFIED BY DEMOGRAPHIC UPDATE ON 04/09 AT 0613    HCT 49.0 39.0 - 52.0 %    Comment: QA FLAGS AND/OR RANGES MODIFIED BY DEMOGRAPHIC UPDATE ON 04/09 AT 0245 QA FLAGS AND/OR RANGES MODIFIED BY DEMOGRAPHIC UPDATE ON 04/09 AT 0247 QA FLAGS AND/OR RANGES MODIFIED BY DEMOGRAPHIC UPDATE ON 04/09 AT 0311 QA FLAGS AND/OR RANGES MODIFIED BY DEMOGRAPHIC UPDATE ON 04/09 AT 0437 QA FLAGS AND/OR RANGES MODIFIED BY DEMOGRAPHIC UPDATE ON 04/09 AT 0510 QA FLAGS AND/OR RANGES MODIFIED BY DEMOGRAPHIC UPDATE ON 04/09 AT 0526 QA FLAGS AND/OR RANGES MODIFIED BY  DEMOGRAPHIC UPDATE ON 04/09 AT 0559 QA FLAGS AND/OR RANGES MODIFIED BY DEMOGRAPHIC UPDATE ON 04/09 AT 2774   CG4 I-STAT (Lactic acid)     Status: Abnormal   Collection Time: 07/30/15  1:57 AM  Result Value Ref Range   Lactic Acid, Venous 3.61 (HH) 0.5 - 2.0 mmol/L    Comment: QA FLAGS AND/OR RANGES MODIFIED BY DEMOGRAPHIC UPDATE ON 04/09 AT 0245 QA FLAGS AND/OR RANGES MODIFIED BY DEMOGRAPHIC UPDATE ON 04/09 AT 0247 QA FLAGS AND/OR RANGES MODIFIED BY DEMOGRAPHIC UPDATE ON 04/09 AT 0311 QA FLAGS AND/OR RANGES MODIFIED BY DEMOGRAPHIC UPDATE ON 04/09 AT 0437 QA FLAGS AND/OR RANGES MODIFIED BY DEMOGRAPHIC UPDATE ON 04/09 AT 0510 QA FLAGS AND/OR RANGES MODIFIED BY DEMOGRAPHIC UPDATE ON 04/09 AT 0526 QA FLAGS AND/OR RANGES MODIFIED BY DEMOGRAPHIC UPDATE ON 04/09 AT 0559 QA FLAGS AND/OR RANGES MODIFIED BY DEMOGRAPHIC UPDATE ON 04/09 AT 1287    Comment NOTIFIED PHYSICIAN   I-Stat arterial blood gas, ED     Status: Abnormal   Collection Time: 07/30/15  3:09 AM  Result Value Ref Range   pH, Arterial 7.175 (LL) 7.350 - 7.450    Comment: QA FLAGS AND/OR RANGES MODIFIED BY DEMOGRAPHIC UPDATE ON 04/09 AT 0311 QA FLAGS AND/OR RANGES MODIFIED BY DEMOGRAPHIC UPDATE ON 04/09 AT 0437 QA FLAGS AND/OR RANGES MODIFIED BY DEMOGRAPHIC UPDATE ON 04/09 AT 0510 QA FLAGS AND/OR RANGES MODIFIED BY  DEMOGRAPHIC UPDATE ON 04/09 AT 0526    pCO2 arterial 46.6 (H) 35.0 - 45.0 mmHg    Comment: QA FLAGS AND/OR RANGES MODIFIED BY DEMOGRAPHIC UPDATE ON 04/09 AT 0311 QA FLAGS AND/OR RANGES MODIFIED BY DEMOGRAPHIC UPDATE ON 04/09 AT 0437 QA FLAGS AND/OR RANGES MODIFIED BY DEMOGRAPHIC UPDATE ON 04/09 AT 0510 QA FLAGS AND/OR RANGES MODIFIED BY DEMOGRAPHIC UPDATE ON 04/09 AT 0526    pO2, Arterial 257.0 (H) 80.0 - 100.0 mmHg    Comment: QA FLAGS AND/OR RANGES MODIFIED BY DEMOGRAPHIC UPDATE ON 04/09 AT 0311 QA FLAGS AND/OR RANGES MODIFIED BY DEMOGRAPHIC UPDATE ON 04/09 AT 0437 QA FLAGS AND/OR RANGES MODIFIED BY DEMOGRAPHIC  UPDATE ON 04/09 AT 0510 QA FLAGS AND/OR RANGES MODIFIED BY DEMOGRAPHIC UPDATE ON 04/09 AT 0526    Bicarbonate 17.6 (L) 20.0 - 24.0 mEq/L    Comment: QA FLAGS AND/OR RANGES MODIFIED BY DEMOGRAPHIC UPDATE ON 04/09 AT 0311 QA FLAGS AND/OR RANGES MODIFIED BY DEMOGRAPHIC UPDATE ON 04/09 AT 0437 QA FLAGS AND/OR RANGES MODIFIED BY DEMOGRAPHIC UPDATE ON 04/09 AT 0510 QA FLAGS AND/OR RANGES MODIFIED BY DEMOGRAPHIC UPDATE ON 04/09 AT 0526    TCO2 19 0 - 100 mmol/L    Comment: QA FLAGS AND/OR RANGES MODIFIED BY DEMOGRAPHIC UPDATE ON 04/09 AT 0311 QA FLAGS AND/OR RANGES MODIFIED BY DEMOGRAPHIC UPDATE ON 04/09 AT 0437 QA FLAGS AND/OR RANGES MODIFIED BY DEMOGRAPHIC UPDATE ON 04/09 AT 0510 QA FLAGS AND/OR RANGES MODIFIED BY DEMOGRAPHIC UPDATE ON 04/09 AT 0526    O2 Saturation 100.0 %   Acid-base deficit 11.0 (H) 0.0 - 2.0 mmol/L    Comment: QA FLAGS AND/OR RANGES MODIFIED BY DEMOGRAPHIC UPDATE ON 04/09 AT 0311 QA FLAGS AND/OR RANGES MODIFIED BY DEMOGRAPHIC UPDATE ON 04/09 AT 0437 QA FLAGS AND/OR RANGES MODIFIED BY DEMOGRAPHIC UPDATE ON 04/09 AT 0510 QA FLAGS AND/OR RANGES MODIFIED BY DEMOGRAPHIC UPDATE ON 04/09 AT 0526    Patient temperature 95.2 F    Collection site RADIAL, ALLEN'S TEST ACCEPTABLE    Drawn by RT    Sample type ARTERIAL    Comment NOTIFIED PHYSICIAN   I-STAT 3, arterial blood gas (G3+)     Status: Abnormal   Collection Time: 07/30/15  3:09 AM  Result Value Ref Range   pH, Arterial 7.175 (LL) 7.350 - 7.450    Comment: QA FLAGS AND/OR RANGES MODIFIED BY DEMOGRAPHIC UPDATE ON 04/09 AT 0311 QA FLAGS AND/OR RANGES MODIFIED BY DEMOGRAPHIC UPDATE ON 04/09 AT 0437 QA FLAGS AND/OR RANGES MODIFIED BY DEMOGRAPHIC UPDATE ON 04/09 AT 0510 QA FLAGS AND/OR RANGES MODIFIED BY DEMOGRAPHIC UPDATE ON 04/09 AT 0526 QA FLAGS AND/OR RANGES MODIFIED BY DEMOGRAPHIC UPDATE ON 04/09 AT 0559 QA FLAGS AND/OR RANGES MODIFIED BY DEMOGRAPHIC UPDATE ON 04/09 AT 0613    pCO2 arterial 46.6 (H) 35.0 - 45.0 mmHg     Comment: QA FLAGS AND/OR RANGES MODIFIED BY DEMOGRAPHIC UPDATE ON 04/09 AT 0311 QA FLAGS AND/OR RANGES MODIFIED BY DEMOGRAPHIC UPDATE ON 04/09 AT 0437 QA FLAGS AND/OR RANGES MODIFIED BY DEMOGRAPHIC UPDATE ON 04/09 AT 0510 QA FLAGS AND/OR RANGES MODIFIED BY DEMOGRAPHIC UPDATE ON 04/09 AT 0526 QA FLAGS AND/OR RANGES MODIFIED BY DEMOGRAPHIC UPDATE ON 04/09 AT 0559 QA FLAGS AND/OR RANGES MODIFIED BY DEMOGRAPHIC UPDATE ON 04/09 AT 0613    pO2, Arterial 257.0 (H) 80.0 - 100.0 mmHg    Comment: QA FLAGS AND/OR RANGES MODIFIED BY DEMOGRAPHIC UPDATE ON 04/09 AT 0311 QA FLAGS AND/OR RANGES MODIFIED BY DEMOGRAPHIC UPDATE ON 04/09 AT 0437 QA FLAGS  AND/OR RANGES MODIFIED BY DEMOGRAPHIC UPDATE ON 04/09 AT 0510 QA FLAGS AND/OR RANGES MODIFIED BY DEMOGRAPHIC UPDATE ON 04/09 AT 0526 QA FLAGS AND/OR RANGES MODIFIED BY DEMOGRAPHIC UPDATE ON 04/09 AT 0559 QA FLAGS AND/OR RANGES MODIFIED BY DEMOGRAPHIC UPDATE ON 04/09 AT 1610    Bicarbonate 17.6 (L) 20.0 - 24.0 mEq/L    Comment: QA FLAGS AND/OR RANGES MODIFIED BY DEMOGRAPHIC UPDATE ON 04/09 AT 0311 QA FLAGS AND/OR RANGES MODIFIED BY DEMOGRAPHIC UPDATE ON 04/09 AT 0437 QA FLAGS AND/OR RANGES MODIFIED BY DEMOGRAPHIC UPDATE ON 04/09 AT 0510 QA FLAGS AND/OR RANGES MODIFIED BY DEMOGRAPHIC UPDATE ON 04/09 AT 0526 QA FLAGS AND/OR RANGES MODIFIED BY DEMOGRAPHIC UPDATE ON 04/09 AT 0559 QA FLAGS AND/OR RANGES MODIFIED BY DEMOGRAPHIC UPDATE ON 04/09 AT 0613    TCO2 19 0 - 100 mmol/L    Comment: QA FLAGS AND/OR RANGES MODIFIED BY DEMOGRAPHIC UPDATE ON 04/09 AT 0311 QA FLAGS AND/OR RANGES MODIFIED BY DEMOGRAPHIC UPDATE ON 04/09 AT 0437 QA FLAGS AND/OR RANGES MODIFIED BY DEMOGRAPHIC UPDATE ON 04/09 AT 0510 QA FLAGS AND/OR RANGES MODIFIED BY DEMOGRAPHIC UPDATE ON 04/09 AT 0526 QA FLAGS AND/OR RANGES MODIFIED BY DEMOGRAPHIC UPDATE ON 04/09 AT 0559 QA FLAGS AND/OR RANGES MODIFIED BY DEMOGRAPHIC UPDATE ON 04/09 AT 0613    O2 Saturation 100.0 %   Acid-base deficit 11.0  (H) 0.0 - 2.0 mmol/L    Comment: QA FLAGS AND/OR RANGES MODIFIED BY DEMOGRAPHIC UPDATE ON 04/09 AT 0311 QA FLAGS AND/OR RANGES MODIFIED BY DEMOGRAPHIC UPDATE ON 04/09 AT 0437 QA FLAGS AND/OR RANGES MODIFIED BY DEMOGRAPHIC UPDATE ON 04/09 AT 0510 QA FLAGS AND/OR RANGES MODIFIED BY DEMOGRAPHIC UPDATE ON 04/09 AT 0526 QA FLAGS AND/OR RANGES MODIFIED BY DEMOGRAPHIC UPDATE ON 04/09 AT 0559 QA FLAGS AND/OR RANGES MODIFIED BY DEMOGRAPHIC UPDATE ON 04/09 AT 9604    Patient temperature 95.2 F    Collection site RADIAL, ALLEN'S TEST ACCEPTABLE    Drawn by RT    Sample type ARTERIAL    Comment NOTIFIED PHYSICIAN   Urinalysis, Routine w reflex microscopic (not at [facility])     Status: Abnormal   Collection Time: 07/30/15  3:12 AM  Result Value Ref Range   Color, Urine YELLOW YELLOW    Comment: QA FLAGS AND/OR RANGES MODIFIED BY DEMOGRAPHIC UPDATE ON 04/09 AT 0437 QA FLAGS AND/OR RANGES MODIFIED BY DEMOGRAPHIC UPDATE ON 04/09 AT 0510 QA FLAGS AND/OR RANGES MODIFIED BY DEMOGRAPHIC UPDATE ON 04/09 AT 0526 QA FLAGS AND/OR RANGES MODIFIED BY DEMOGRAPHIC UPDATE ON 04/09 AT 0559 QA FLAGS AND/OR RANGES MODIFIED BY DEMOGRAPHIC UPDATE ON 04/09 AT 0613    APPearance CLOUDY (A) CLEAR    Comment: QA FLAGS AND/OR RANGES MODIFIED BY DEMOGRAPHIC UPDATE ON 04/09 AT 0437 QA FLAGS AND/OR RANGES MODIFIED BY DEMOGRAPHIC UPDATE ON 04/09 AT 0510 QA FLAGS AND/OR RANGES MODIFIED BY DEMOGRAPHIC UPDATE ON 04/09 AT 0526 QA FLAGS AND/OR RANGES MODIFIED BY DEMOGRAPHIC UPDATE ON 04/09 AT 0559 QA FLAGS AND/OR RANGES MODIFIED BY DEMOGRAPHIC UPDATE ON 04/09 AT 5409    Specific Gravity, Urine 1.025 1.005 - 1.030    Comment: QA FLAGS AND/OR RANGES MODIFIED BY DEMOGRAPHIC UPDATE ON 04/09 AT 0437 QA FLAGS AND/OR RANGES MODIFIED BY DEMOGRAPHIC UPDATE ON 04/09 AT 0510 QA FLAGS AND/OR RANGES MODIFIED BY DEMOGRAPHIC UPDATE ON 04/09 AT 0526 QA FLAGS AND/OR RANGES MODIFIED BY DEMOGRAPHIC UPDATE ON 04/09 AT 0559 QA FLAGS AND/OR RANGES  MODIFIED BY DEMOGRAPHIC UPDATE ON 04/09 AT 0613    pH 7.0 5.0 - 8.0  Comment: QA FLAGS AND/OR RANGES MODIFIED BY DEMOGRAPHIC UPDATE ON 04/09 AT 0437 QA FLAGS AND/OR RANGES MODIFIED BY DEMOGRAPHIC UPDATE ON 04/09 AT 0510 QA FLAGS AND/OR RANGES MODIFIED BY DEMOGRAPHIC UPDATE ON 04/09 AT 0526 QA FLAGS AND/OR RANGES MODIFIED BY DEMOGRAPHIC UPDATE ON 04/09 AT 0559 QA FLAGS AND/OR RANGES MODIFIED BY DEMOGRAPHIC UPDATE ON 04/09 AT 0613    Glucose, UA 100 (A) NEGATIVE mg/dL    Comment: QA FLAGS AND/OR RANGES MODIFIED BY DEMOGRAPHIC UPDATE ON 04/09 AT 0437 QA FLAGS AND/OR RANGES MODIFIED BY DEMOGRAPHIC UPDATE ON 04/09 AT 0510 QA FLAGS AND/OR RANGES MODIFIED BY DEMOGRAPHIC UPDATE ON 04/09 AT 0526 QA FLAGS AND/OR RANGES MODIFIED BY DEMOGRAPHIC UPDATE ON 04/09 AT 0559 QA FLAGS AND/OR RANGES MODIFIED BY DEMOGRAPHIC UPDATE ON 04/09 AT 0613    Hgb urine dipstick LARGE (A) NEGATIVE    Comment: QA FLAGS AND/OR RANGES MODIFIED BY DEMOGRAPHIC UPDATE ON 04/09 AT 0437 QA FLAGS AND/OR RANGES MODIFIED BY DEMOGRAPHIC UPDATE ON 04/09 AT 0510 QA FLAGS AND/OR RANGES MODIFIED BY DEMOGRAPHIC UPDATE ON 04/09 AT 0526 QA FLAGS AND/OR RANGES MODIFIED BY DEMOGRAPHIC UPDATE ON 04/09 AT 0559 QA FLAGS AND/OR RANGES MODIFIED BY DEMOGRAPHIC UPDATE ON 04/09 AT 0613    Bilirubin Urine NEGATIVE NEGATIVE    Comment: QA FLAGS AND/OR RANGES MODIFIED BY DEMOGRAPHIC UPDATE ON 04/09 AT 0437 QA FLAGS AND/OR RANGES MODIFIED BY DEMOGRAPHIC UPDATE ON 04/09 AT 0510 QA FLAGS AND/OR RANGES MODIFIED BY DEMOGRAPHIC UPDATE ON 04/09 AT 0526 QA FLAGS AND/OR RANGES MODIFIED BY DEMOGRAPHIC UPDATE ON 04/09 AT 0559 QA FLAGS AND/OR RANGES MODIFIED BY DEMOGRAPHIC UPDATE ON 04/09 AT 0613    Ketones, ur NEGATIVE NEGATIVE mg/dL    Comment: QA FLAGS AND/OR RANGES MODIFIED BY DEMOGRAPHIC UPDATE ON 04/09 AT 0437 QA FLAGS AND/OR RANGES MODIFIED BY DEMOGRAPHIC UPDATE ON 04/09 AT 0510 QA FLAGS AND/OR RANGES MODIFIED BY DEMOGRAPHIC UPDATE ON 04/09 AT  0526 QA FLAGS AND/OR RANGES MODIFIED BY DEMOGRAPHIC UPDATE ON 04/09 AT 0559 QA FLAGS AND/OR RANGES MODIFIED BY DEMOGRAPHIC UPDATE ON 04/09 AT 0613    Protein, ur >300 (A) NEGATIVE mg/dL    Comment: QA FLAGS AND/OR RANGES MODIFIED BY DEMOGRAPHIC UPDATE ON 04/09 AT 0437 QA FLAGS AND/OR RANGES MODIFIED BY DEMOGRAPHIC UPDATE ON 04/09 AT 0510 QA FLAGS AND/OR RANGES MODIFIED BY DEMOGRAPHIC UPDATE ON 04/09 AT 0526 QA FLAGS AND/OR RANGES MODIFIED BY DEMOGRAPHIC UPDATE ON 04/09 AT 0559 QA FLAGS AND/OR RANGES MODIFIED BY DEMOGRAPHIC UPDATE ON 04/09 AT 0613    Nitrite NEGATIVE NEGATIVE    Comment: QA FLAGS AND/OR RANGES MODIFIED BY DEMOGRAPHIC UPDATE ON 04/09 AT 0437 QA FLAGS AND/OR RANGES MODIFIED BY DEMOGRAPHIC UPDATE ON 04/09 AT 0510 QA FLAGS AND/OR RANGES MODIFIED BY DEMOGRAPHIC UPDATE ON 04/09 AT 0526 QA FLAGS AND/OR RANGES MODIFIED BY DEMOGRAPHIC UPDATE ON 04/09 AT 0559 QA FLAGS AND/OR RANGES MODIFIED BY DEMOGRAPHIC UPDATE ON 04/09 AT 0613    Leukocytes, UA NEGATIVE NEGATIVE    Comment: QA FLAGS AND/OR RANGES MODIFIED BY DEMOGRAPHIC UPDATE ON 04/09 AT 0437 QA FLAGS AND/OR RANGES MODIFIED BY DEMOGRAPHIC UPDATE ON 04/09 AT 0510 QA FLAGS AND/OR RANGES MODIFIED BY DEMOGRAPHIC UPDATE ON 04/09 AT 0526 QA FLAGS AND/OR RANGES MODIFIED BY DEMOGRAPHIC UPDATE ON 04/09 AT 0559 QA FLAGS AND/OR RANGES MODIFIED BY DEMOGRAPHIC UPDATE ON 04/09 AT 4268   Urine rapid drug screen (hosp performed)     Status: None   Collection Time: 07/30/15  3:12 AM  Result Value Ref Range   Opiates NONE DETECTED NONE DETECTED  Comment: QA FLAGS AND/OR RANGES MODIFIED BY DEMOGRAPHIC UPDATE ON 04/09 AT 0437 QA FLAGS AND/OR RANGES MODIFIED BY DEMOGRAPHIC UPDATE ON 04/09 AT 0510 QA FLAGS AND/OR RANGES MODIFIED BY DEMOGRAPHIC UPDATE ON 04/09 AT 0526 QA FLAGS AND/OR RANGES MODIFIED BY DEMOGRAPHIC UPDATE ON 04/09 AT 0559 QA FLAGS AND/OR RANGES MODIFIED BY DEMOGRAPHIC UPDATE ON 04/09 AT 9424    Cocaine NONE DETECTED NONE  DETECTED    Comment: QA FLAGS AND/OR RANGES MODIFIED BY DEMOGRAPHIC UPDATE ON 04/09 AT 0437 QA FLAGS AND/OR RANGES MODIFIED BY DEMOGRAPHIC UPDATE ON 04/09 AT 0510 QA FLAGS AND/OR RANGES MODIFIED BY DEMOGRAPHIC UPDATE ON 04/09 AT 0526 QA FLAGS AND/OR RANGES MODIFIED BY DEMOGRAPHIC UPDATE ON 04/09 AT 0559 QA FLAGS AND/OR RANGES MODIFIED BY DEMOGRAPHIC UPDATE ON 04/09 AT 9984    Benzodiazepines NONE DETECTED NONE DETECTED    Comment: QA FLAGS AND/OR RANGES MODIFIED BY DEMOGRAPHIC UPDATE ON 04/09 AT 0437 QA FLAGS AND/OR RANGES MODIFIED BY DEMOGRAPHIC UPDATE ON 04/09 AT 0510 QA FLAGS AND/OR RANGES MODIFIED BY DEMOGRAPHIC UPDATE ON 04/09 AT 0526 QA FLAGS AND/OR RANGES MODIFIED BY DEMOGRAPHIC UPDATE ON 04/09 AT 0559 QA FLAGS AND/OR RANGES MODIFIED BY DEMOGRAPHIC UPDATE ON 04/09 AT 8924    Amphetamines NONE DETECTED NONE DETECTED    Comment: QA FLAGS AND/OR RANGES MODIFIED BY DEMOGRAPHIC UPDATE ON 04/09 AT 0437 QA FLAGS AND/OR RANGES MODIFIED BY DEMOGRAPHIC UPDATE ON 04/09 AT 0510 QA FLAGS AND/OR RANGES MODIFIED BY DEMOGRAPHIC UPDATE ON 04/09 AT 0526 QA FLAGS AND/OR RANGES MODIFIED BY DEMOGRAPHIC UPDATE ON 04/09 AT 0559 QA FLAGS AND/OR RANGES MODIFIED BY DEMOGRAPHIC UPDATE ON 04/09 AT 0613    Tetrahydrocannabinol NONE DETECTED NONE DETECTED    Comment: QA FLAGS AND/OR RANGES MODIFIED BY DEMOGRAPHIC UPDATE ON 04/09 AT 0437 QA FLAGS AND/OR RANGES MODIFIED BY DEMOGRAPHIC UPDATE ON 04/09 AT 0510 QA FLAGS AND/OR RANGES MODIFIED BY DEMOGRAPHIC UPDATE ON 04/09 AT 0526 QA FLAGS AND/OR RANGES MODIFIED BY DEMOGRAPHIC UPDATE ON 04/09 AT 0559 QA FLAGS AND/OR RANGES MODIFIED BY DEMOGRAPHIC UPDATE ON 04/09 AT 4974    Barbiturates NONE DETECTED NONE DETECTED    Comment:        DRUG SCREEN FOR MEDICAL PURPOSES ONLY.  IF CONFIRMATION IS NEEDED FOR ANY PURPOSE, NOTIFY LAB WITHIN 5 DAYS.        LOWEST DETECTABLE LIMITS FOR URINE DRUG SCREEN Drug Class       Cutoff (ng/mL) Amphetamine       1000 Barbiturate      200 Benzodiazepine   200 Tricyclics       300 Opiates          300 Cocaine          300 [facility]              50 QA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

## 2015-07-30 NOTE — Progress Notes (Signed)
RT's made 3 attempts to place arterial line but was unsuccessful. MD and RN aware.
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
## 2015-07-30 NOTE — ED Notes (Signed)
ffp initiated, due to drop in blood pressure, central line being placed by dr Andrey Campanilewilson.

## 2015-07-30 NOTE — ED Notes (Signed)
Blood given emergent release.
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
## 2015-07-31 ENCOUNTER — Inpatient Hospital Stay (HOSPITAL_COMMUNITY): Payer: BLUE CROSS/BLUE SHIELD | Admitting: Anesthesiology

## 2015-07-31 ENCOUNTER — Inpatient Hospital Stay (HOSPITAL_COMMUNITY): Payer: BLUE CROSS/BLUE SHIELD

## 2015-07-31 ENCOUNTER — Encounter (HOSPITAL_COMMUNITY): Admission: EM | Disposition: A | Discharge status: Skilled Nursing Facility | Payer: Self-pay | Source: Home / Self Care

## 2015-07-31 ENCOUNTER — Encounter (HOSPITAL_COMMUNITY): Payer: Self-pay | Admitting: Radiology

## 2015-07-31 DIAGNOSIS — S34109A Unspecified injury to unspecified level of lumbar spinal cord, initial encounter: Secondary | ICD-10-CM

## 2015-07-31 DIAGNOSIS — S32009A Unspecified fracture of unspecified lumbar vertebra, initial encounter for closed fracture: Secondary | ICD-10-CM | POA: Diagnosis present

## 2015-07-31 HISTORY — PX: POSTERIOR LUMBAR FUSION 4 LEVEL: SHX6037

## 2015-07-31 LAB — POCT I-STAT 7, (LYTES, BLD GAS, ICA,H+H)
ACID-BASE DEFICIT: 5 mmol/L — AB (ref 0.0–2.0)
BICARBONATE: 21.4 meq/L (ref 20.0–24.0)
CALCIUM ION: 1.19 mmol/L (ref 1.12–1.23)
HEMATOCRIT: 26 % — AB (ref 39.0–52.0)
HEMOGLOBIN: 8.8 g/dL — AB (ref 13.0–17.0)
O2 Saturation: 96 %
PH ART: 7.297 — AB (ref 7.350–7.450)
POTASSIUM: 4.1 mmol/L (ref 3.5–5.1)
Patient temperature: 36.6
SODIUM: 141 mmol/L (ref 135–145)
TCO2: 23 mmol/L (ref 0–100)
pCO2 arterial: 43.6 mmHg (ref 35.0–45.0)
pO2, Arterial: 89 mmHg (ref 80.0–100.0)

## 2015-07-31 LAB — PROTIME-INR
INR: 1.09 (ref 0.00–1.49)
INR: 1.31 (ref 0.00–1.49)
INR: 1.28 (ref 0.00–1.49)
PROTHROMBIN TIME: 14.3 s (ref 11.6–15.2)
PROTHROMBIN TIME: 16.4 s — AB (ref 11.6–15.2)
PROTHROMBIN TIME: 16.1 s — AB (ref 11.6–15.2)

## 2015-07-31 LAB — COMPREHENSIVE METABOLIC PANEL
ALK PHOS: 40 U/L (ref 38–126)
ALT: 168 U/L — ABNORMAL HIGH (ref 17–63)
ALT: 111 U/L — AB (ref 17–63)
ANION GAP: 15 (ref 5–15)
AST: 235 U/L — AB (ref 15–41)
AST: 213 U/L — ABNORMAL HIGH (ref 15–41)
Albumin: 3.5 g/dL (ref 3.5–5.0)
Albumin: 2.7 g/dL — ABNORMAL LOW (ref 3.5–5.0)
Alkaline Phosphatase: 51 U/L (ref 38–126)
Anion gap: 15 (ref 5–15)
BILIRUBIN TOTAL: 1.1 mg/dL (ref 0.3–1.2)
BUN: 10 mg/dL (ref 6–20)
BUN: 10 mg/dL (ref 6–20)
CALCIUM: 8.2 mg/dL — AB (ref 8.9–10.3)
CALCIUM: 6.9 mg/dL — AB (ref 8.9–10.3)
CO2: 16 mmol/L — ABNORMAL LOW (ref 22–32)
CO2: 14 mmol/L — ABNORMAL LOW (ref 22–32)
CREATININE: 1.21 mg/dL (ref 0.61–1.24)
Chloride: 103 mmol/L (ref 101–111)
Chloride: 107 mmol/L (ref 101–111)
Creatinine, Ser: 0.98 mg/dL (ref 0.61–1.24)
Glucose, Bld: 144 mg/dL — ABNORMAL HIGH (ref 65–99)
Glucose, Bld: 173 mg/dL — ABNORMAL HIGH (ref 65–99)
POTASSIUM: 3.4 mmol/L — AB (ref 3.5–5.1)
Potassium: 3.9 mmol/L (ref 3.5–5.1)
SODIUM: 134 mmol/L — AB (ref 135–145)
SODIUM: 136 mmol/L (ref 135–145)
TOTAL PROTEIN: 6.7 g/dL (ref 6.5–8.1)
Total Bilirubin: 1.6 mg/dL — ABNORMAL HIGH (ref 0.3–1.2)
Total Protein: 4.9 g/dL — ABNORMAL LOW (ref 6.5–8.1)

## 2015-07-31 LAB — POCT I-STAT 3, ART BLOOD GAS (G3+)
Acid-base deficit: 11 mmol/L — ABNORMAL HIGH (ref 0.0–2.0)
Acid-base deficit: 13 mmol/L — ABNORMAL HIGH (ref 0.0–2.0)
BICARBONATE: 14.3 meq/L — AB (ref 20.0–24.0)
Bicarbonate: 17.6 meq/L — ABNORMAL LOW (ref 20.0–24.0)
O2 Saturation: 100 %
O2 Saturation: 99 %
PCO2 ART: 46.6 mmHg — AB (ref 35.0–45.0)
PCO2 ART: 34 mmHg — AB (ref 35.0–45.0)
PH ART: 7.221 — AB (ref 7.350–7.450)
PO2 ART: 257 mmHg — AB (ref 80.0–100.0)
Patient temperature: 95.2
Patient temperature: 95.7
TCO2: 19 mmol/L (ref 0–100)
TCO2: 15 mmol/L (ref 0–100)
pH, Arterial: 7.175 — CL (ref 7.350–7.450)
pO2, Arterial: 173 mmHg — ABNORMAL HIGH (ref 80.0–100.0)

## 2015-07-31 LAB — CG4 I-STAT (LACTIC ACID): Lactic Acid, Venous: 3.61 mmol/L (ref 0.5–2.0)

## 2015-07-31 LAB — PREPARE FRESH FROZEN PLASMA
UNIT DIVISION: 0
UNIT DIVISION: 0
UNIT DIVISION: 0
Unit division: 0
Unit division: 0
Unit division: 0

## 2015-07-31 LAB — CBC
HCT: 32.1 % — ABNORMAL LOW (ref 39.0–52.0)
HCT: 31.2 % — ABNORMAL LOW (ref 39.0–52.0)
HCT: 27.7 % — ABNORMAL LOW (ref 39.0–52.0)
HCT: 23.8 % — ABNORMAL LOW (ref 39.0–52.0)
HEMATOCRIT: 44.3 % (ref 39.0–52.0)
HEMATOCRIT: 25.8 % — AB (ref 39.0–52.0)
HEMOGLOBIN: 9.6 g/dL — AB (ref 13.0–17.0)
Hemoglobin: 15.4 g/dL (ref 13.0–17.0)
Hemoglobin: 10.8 g/dL — ABNORMAL LOW (ref 13.0–17.0)
Hemoglobin: 10.6 g/dL — ABNORMAL LOW (ref 13.0–17.0)
Hemoglobin: 8.8 g/dL — ABNORMAL LOW (ref 13.0–17.0)
Hemoglobin: 8.5 g/dL — ABNORMAL LOW (ref 13.0–17.0)
MCH: 34.5 pg — ABNORMAL HIGH (ref 26.0–34.0)
MCH: 32.4 pg (ref 26.0–34.0)
MCH: 32.4 pg (ref 26.0–34.0)
MCH: 32.2 pg (ref 26.0–34.0)
MCH: 31.8 pg (ref 26.0–34.0)
MCH: 33.6 pg (ref 26.0–34.0)
MCHC: 34.8 g/dL (ref 30.0–36.0)
MCHC: 33.6 g/dL (ref 30.0–36.0)
MCHC: 34 g/dL (ref 30.0–36.0)
MCHC: 34.7 g/dL (ref 30.0–36.0)
MCHC: 34.1 g/dL (ref 30.0–36.0)
MCHC: 35.7 g/dL (ref 30.0–36.0)
MCV: 99.1 fL (ref 78.0–100.0)
MCV: 96.4 fL (ref 78.0–100.0)
MCV: 95.4 fL (ref 78.0–100.0)
MCV: 93 fL (ref 78.0–100.0)
MCV: 93.1 fL (ref 78.0–100.0)
MCV: 94.1 fL (ref 78.0–100.0)
PLATELETS: 131 10*3/uL — AB (ref 150–400)
PLATELETS: 149 10*3/uL — AB (ref 150–400)
PLATELETS: 102 10*3/uL — AB (ref 150–400)
PLATELETS: 96 10*3/uL — AB (ref 150–400)
PLATELETS: 91 10*3/uL — AB (ref 150–400)
Platelets: 186 10*3/uL (ref 150–400)
RBC: 4.47 MIL/uL (ref 4.22–5.81)
RBC: 3.33 MIL/uL — AB (ref 4.22–5.81)
RBC: 3.27 MIL/uL — AB (ref 4.22–5.81)
RBC: 2.98 MIL/uL — AB (ref 4.22–5.81)
RBC: 2.77 MIL/uL — ABNORMAL LOW (ref 4.22–5.81)
RBC: 2.53 MIL/uL — ABNORMAL LOW (ref 4.22–5.81)
RDW: 12.6 % (ref 11.5–15.5)
RDW: 14.2 % (ref 11.5–15.5)
RDW: 14.7 % (ref 11.5–15.5)
RDW: 14.6 % (ref 11.5–15.5)
RDW: 14.4 % (ref 11.5–15.5)
RDW: 14.6 % (ref 11.5–15.5)
WBC: 15.9 10*3/uL — AB (ref 4.0–10.5)
WBC: 12.3 10*3/uL — ABNORMAL HIGH (ref 4.0–10.5)
WBC: 14.8 10*3/uL — ABNORMAL HIGH (ref 4.0–10.5)
WBC: 9 10*3/uL (ref 4.0–10.5)
WBC: 7.9 10*3/uL (ref 4.0–10.5)
WBC: 9.7 10*3/uL (ref 4.0–10.5)

## 2015-07-31 LAB — BASIC METABOLIC PANEL
Anion gap: 9 (ref 5–15)
BUN: 16 mg/dL (ref 6–20)
CALCIUM: 7.7 mg/dL — AB (ref 8.9–10.3)
CO2: 20 mmol/L — AB (ref 22–32)
CREATININE: 0.96 mg/dL (ref 0.61–1.24)
Chloride: 110 mmol/L (ref 101–111)
GLUCOSE: 149 mg/dL — AB (ref 65–99)
Potassium: 4.1 mmol/L (ref 3.5–5.1)
SODIUM: 139 mmol/L (ref 135–145)

## 2015-07-31 LAB — URINALYSIS, ROUTINE W REFLEX MICROSCOPIC
BILIRUBIN URINE: NEGATIVE
Glucose, UA: 100 mg/dL — AB
Ketones, ur: NEGATIVE mg/dL
Leukocytes, UA: NEGATIVE
Nitrite: NEGATIVE
Specific Gravity, Urine: 1.025 (ref 1.005–1.030)
pH: 7 (ref 5.0–8.0)

## 2015-07-31 LAB — BLOOD GAS, ARTERIAL
Acid-base deficit: 3.4 mmol/L — ABNORMAL HIGH (ref 0.0–2.0)
Bicarbonate: 19.4 mEq/L — ABNORMAL LOW (ref 20.0–24.0)
Drawn by: 290171
FIO2: 0.4
LHR: 25 {breaths}/min
MECHVT: 620 mL
O2 SAT: 98.8 %
PATIENT TEMPERATURE: 100.9
PCO2 ART: 27.2 mmHg — AB (ref 35.0–45.0)
PEEP: 5 cmH2O
PH ART: 7.472 — AB (ref 7.350–7.450)
PO2 ART: 122 mmHg — AB (ref 80.0–100.0)
TCO2: 20.2 mmol/L (ref 0–100)

## 2015-07-31 LAB — POCT I-STAT, CHEM 8
BUN: 10 mg/dL (ref 6–20)
CALCIUM ION: 0.95 mmol/L — AB (ref 1.12–1.23)
CREATININE: 1.2 mg/dL (ref 0.61–1.24)
Chloride: 102 mmol/L (ref 101–111)
GLUCOSE: 141 mg/dL — AB (ref 65–99)
HCT: 49 % (ref 39.0–52.0)
Hemoglobin: 16.7 g/dL (ref 13.0–17.0)
Potassium: 3.3 mmol/L — ABNORMAL LOW (ref 3.5–5.1)
SODIUM: 136 mmol/L (ref 135–145)
TCO2: 18 mmol/L (ref 0–100)

## 2015-07-31 LAB — URINE MICROSCOPIC-ADD ON

## 2015-07-31 LAB — RAPID URINE DRUG SCREEN, HOSP PERFORMED
AMPHETAMINES: NOT DETECTED
Barbiturates: NOT DETECTED
Benzodiazepines: NOT DETECTED
Cocaine: NOT DETECTED
Opiates: NOT DETECTED
Tetrahydrocannabinol: NOT DETECTED

## 2015-07-31 LAB — POCT I-STAT TROPONIN I: Troponin i, poc: 0.01 ng/mL (ref 0.00–0.08)

## 2015-07-31 LAB — ETHANOL: ALCOHOL ETHYL (B): 244 mg/dL — AB (ref <5)

## 2015-07-31 LAB — APTT
aPTT: 27 seconds (ref 24–37)
aPTT: 26 seconds (ref 24–37)

## 2015-07-31 LAB — PREPARE RBC (CROSSMATCH)

## 2015-07-31 SURGERY — POSTERIOR LUMBAR FUSION 4 LEVEL
Anesthesia: General | Site: Back

## 2015-07-31 MED ORDER — VANCOMYCIN HCL 1000 MG IV SOLR
INTRAVENOUS | Status: AC
  Filled 2015-07-31: qty 1000

## 2015-07-31 MED ORDER — ONDANSETRON HCL 4 MG/2ML IJ SOLN
INTRAMUSCULAR | Status: AC
  Filled 2015-07-31: qty 2

## 2015-07-31 MED ORDER — 0.9 % SODIUM CHLORIDE (POUR BTL) OPTIME
TOPICAL | Status: DC | PRN
  Administered 2015-07-31: 1000 mL

## 2015-07-31 MED ORDER — SODIUM CHLORIDE 0.9 % IV SOLN
Freq: Once | INTRAVENOUS | Status: AC
  Administered 2015-07-31: 13:00:00 via INTRAVENOUS

## 2015-07-31 MED ORDER — PHENYLEPHRINE 40 MCG/ML (10ML) SYRINGE FOR IV PUSH (FOR BLOOD PRESSURE SUPPORT)
PREFILLED_SYRINGE | INTRAVENOUS | Status: AC
  Filled 2015-07-31: qty 10

## 2015-07-31 MED ORDER — PHENOL 1.4 % MT LIQD
1.0000 | OROMUCOSAL | Status: DC | PRN
Start: 2015-07-31 — End: 2015-09-01

## 2015-07-31 MED ORDER — ROCURONIUM BROMIDE 50 MG/5ML IV SOLN
INTRAVENOUS | Status: AC
  Filled 2015-07-31: qty 1

## 2015-07-31 MED ORDER — SODIUM CHLORIDE 0.9 % IV SOLN: Freq: Once | INTRAVENOUS | Status: DC

## 2015-07-31 MED ORDER — PHENYLEPHRINE HCL 10 MG/ML IJ SOLN
INTRAMUSCULAR | Status: DC | PRN
  Administered 2015-07-31: 40 ug via INTRAVENOUS

## 2015-07-31 MED ORDER — ACETAMINOPHEN 325 MG PO TABS
650.0000 mg | ORAL_TABLET | ORAL | Status: DC | PRN
  Administered 2015-08-01 – 2015-08-03 (×6): 650 mg via ORAL
  Filled 2015-07-31 (×7): qty 2

## 2015-07-31 MED ORDER — CEFAZOLIN SODIUM-DEXTROSE 2-4 GM/100ML-% IV SOLN
2.0000 g | Freq: Three times a day (TID) | INTRAVENOUS | Status: DC
  Administered 2015-07-31 – 2015-08-04 (×11): 2 g via INTRAVENOUS
  Filled 2015-07-31 (×13): qty 100

## 2015-07-31 MED ORDER — SODIUM CHLORIDE 0.9% FLUSH: 3.0000 mL | INTRAVENOUS | Status: DC | PRN

## 2015-07-31 MED ORDER — FENTANYL CITRATE (PF) 250 MCG/5ML IJ SOLN
INTRAMUSCULAR | Status: AC
  Filled 2015-07-31: qty 5

## 2015-07-31 MED ORDER — ONDANSETRON HCL 4 MG/2ML IJ SOLN
INTRAMUSCULAR | Status: DC | PRN
  Administered 2015-07-31: 4 mg via INTRAVENOUS

## 2015-07-31 MED ORDER — ALBUMIN HUMAN 5 % IV SOLN
INTRAVENOUS | Status: DC | PRN
  Administered 2015-07-31 (×2): via INTRAVENOUS

## 2015-07-31 MED ORDER — IPRATROPIUM-ALBUTEROL 0.5-2.5 (3) MG/3ML IN SOLN
3.0000 mL | Freq: Four times a day (QID) | RESPIRATORY_TRACT | Status: DC
  Administered 2015-07-31 – 2015-08-20 (×79): 3 mL via RESPIRATORY_TRACT
  Filled 2015-07-31 (×79): qty 3

## 2015-07-31 MED ORDER — ROCURONIUM BROMIDE 100 MG/10ML IV SOLN
INTRAVENOUS | Status: DC | PRN
  Administered 2015-07-31 (×5): 20 mg via INTRAVENOUS
  Administered 2015-07-31: 30 mg via INTRAVENOUS
  Administered 2015-07-31: 50 mg via INTRAVENOUS

## 2015-07-31 MED ORDER — CEFAZOLIN SODIUM-DEXTROSE 2-4 GM/100ML-% IV SOLN
INTRAVENOUS | Status: AC
  Filled 2015-07-31: qty 100

## 2015-07-31 MED ORDER — MIDAZOLAM HCL 2 MG/2ML IJ SOLN
INTRAMUSCULAR | Status: AC
  Filled 2015-07-31: qty 2

## 2015-07-31 MED ORDER — SODIUM CHLORIDE 0.9 % IR SOLN
Status: DC | PRN
  Administered 2015-07-31: 13:00:00

## 2015-07-31 MED ORDER — MIDAZOLAM HCL 5 MG/5ML IJ SOLN
INTRAMUSCULAR | Status: DC | PRN
  Administered 2015-07-31: 2 mg via INTRAVENOUS

## 2015-07-31 MED ORDER — DEXMEDETOMIDINE HCL IN NACL 200 MCG/50ML IV SOLN
0.4000 ug/kg/h | INTRAVENOUS | Status: DC
  Administered 2015-07-31: 0.7 ug/kg/h via INTRAVENOUS
  Administered 2015-07-31: 0.8 ug/kg/h via INTRAVENOUS
  Administered 2015-07-31: 0.7 ug/kg/h via INTRAVENOUS
  Filled 2015-07-31 (×4): qty 50

## 2015-07-31 MED ORDER — SODIUM CHLORIDE 0.9 % IV SOLN: 250.0000 mL | INTRAVENOUS | Status: DC

## 2015-07-31 MED ORDER — VANCOMYCIN HCL 500 MG IV SOLR
INTRAVENOUS | Status: DC | PRN
  Administered 2015-07-31: 1000 mg

## 2015-07-31 MED ORDER — CEFAZOLIN SODIUM 1 G IJ SOLR
INTRAMUSCULAR | Status: DC | PRN
  Administered 2015-07-31: 3 g via INTRAMUSCULAR
  Administered 2015-07-31: 2 g via INTRAMUSCULAR

## 2015-07-31 MED ORDER — ONDANSETRON HCL 4 MG/2ML IJ SOLN: 4.0000 mg | INTRAMUSCULAR | Status: DC | PRN

## 2015-07-31 MED ORDER — MENTHOL 3 MG MT LOZG: 1.0000 | LOZENGE | OROMUCOSAL | Status: DC | PRN

## 2015-07-31 MED ORDER — LACTATED RINGERS IV SOLN
INTRAVENOUS | Status: DC | PRN
  Administered 2015-07-31 (×2): via INTRAVENOUS

## 2015-07-31 MED ORDER — THROMBIN 20000 UNITS EX SOLR
CUTANEOUS | Status: DC | PRN
  Administered 2015-07-31 (×2): via TOPICAL

## 2015-07-31 MED ORDER — PROPOFOL 10 MG/ML IV BOLUS
INTRAVENOUS | Status: AC
  Filled 2015-07-31: qty 20

## 2015-07-31 MED ORDER — FENTANYL CITRATE (PF) 100 MCG/2ML IJ SOLN
INTRAMUSCULAR | Status: DC | PRN
  Administered 2015-07-31 (×6): 50 ug via INTRAVENOUS

## 2015-07-31 MED ORDER — ROCURONIUM BROMIDE 50 MG/5ML IV SOLN
INTRAVENOUS | Status: AC
  Filled 2015-07-31: qty 2

## 2015-07-31 MED ORDER — LIDOCAINE-EPINEPHRINE 1 %-1:100000 IJ SOLN
INTRAMUSCULAR | Status: DC | PRN
  Administered 2015-07-31 (×2): 10 mL

## 2015-07-31 MED ORDER — SODIUM CHLORIDE 0.9% FLUSH
3.0000 mL | Freq: Two times a day (BID) | INTRAVENOUS | Status: DC
Start: 2015-07-31 — End: 2015-08-02
  Administered 2015-07-31 – 2015-08-02 (×3): 3 mL via INTRAVENOUS

## 2015-07-31 MED ORDER — ACETAMINOPHEN 650 MG RE SUPP: 650.0000 mg | RECTAL | Status: DC | PRN

## 2015-07-31 SURGICAL SUPPLY — 81 items
BAG DECANTER FOR FLEXI CONT (MISCELLANEOUS) ×3 IMPLANT
BENZOIN TINCTURE PRP APPL 2/3 (GAUZE/BANDAGES/DRESSINGS) ×3 IMPLANT
BLADE CLIPPER SURG (BLADE) IMPLANT
BLADE SURG 11 STRL SS (BLADE) ×3 IMPLANT
BRUSH SCRUB EZ PLAIN DRY (MISCELLANEOUS) ×6 IMPLANT
BUR MATCHSTICK NEURO 3.0 LAGG (BURR) ×3 IMPLANT
BUR PRECISION FLUTE 6.0 (BURR) ×3 IMPLANT
CANISTER SUCT 3000ML PPV (MISCELLANEOUS) ×3 IMPLANT
CAP LOCKING THREADED (Cap) ×30 IMPLANT
CLOSURE WOUND 1/2 X4 (GAUZE/BANDAGES/DRESSINGS) ×1
CONT SPEC 4OZ CLIKSEAL STRL BL (MISCELLANEOUS) ×3 IMPLANT
COVER BACK TABLE 60X90IN (DRAPES) ×3 IMPLANT
CROSSLINK SPINAL FUSION (Cage) ×3 IMPLANT
DECANTER SPIKE VIAL GLASS SM (MISCELLANEOUS) ×3 IMPLANT
DRAPE C-ARM 42X72 X-RAY (DRAPES) ×3 IMPLANT
DRAPE C-ARMOR (DRAPES) ×3 IMPLANT
DRAPE LAPAROTOMY 100X72X124 (DRAPES) ×3 IMPLANT
DRAPE POUCH INSTRU U-SHP 10X18 (DRAPES) ×3 IMPLANT
DRAPE PROXIMA HALF (DRAPES) IMPLANT
DRAPE SURG 17X23 STRL (DRAPES) ×3 IMPLANT
DRSG OPSITE 4X5.5 SM (GAUZE/BANDAGES/DRESSINGS) ×3 IMPLANT
DRSG OPSITE POSTOP 4X10 (GAUZE/BANDAGES/DRESSINGS) ×3 IMPLANT
DURAPREP 26ML APPLICATOR (WOUND CARE) ×3 IMPLANT
ELECT BLADE 4.0 EZ CLEAN MEGAD (MISCELLANEOUS) ×3
ELECT REM PT RETURN 9FT ADLT (ELECTROSURGICAL) ×3
ELECTRODE BLDE 4.0 EZ CLN MEGD (MISCELLANEOUS) ×1 IMPLANT
ELECTRODE REM PT RTRN 9FT ADLT (ELECTROSURGICAL) ×1 IMPLANT
EVACUATOR 3/16  PVC DRAIN (DRAIN) ×2
EVACUATOR 3/16 PVC DRAIN (DRAIN) ×1 IMPLANT
GAUZE SPONGE 4X4 12PLY STRL (GAUZE/BANDAGES/DRESSINGS) ×3 IMPLANT
GAUZE SPONGE 4X4 16PLY XRAY LF (GAUZE/BANDAGES/DRESSINGS) ×6 IMPLANT
GLOVE BIO SURGEON STRL SZ 6.5 (GLOVE) ×6 IMPLANT
GLOVE BIO SURGEON STRL SZ8 (GLOVE) ×6 IMPLANT
GLOVE BIO SURGEONS STRL SZ 6.5 (GLOVE) ×3
GLOVE BIOGEL PI IND STRL 6.5 (GLOVE) ×1 IMPLANT
GLOVE BIOGEL PI INDICATOR 6.5 (GLOVE) ×2
GLOVE EXAM NITRILE LRG STRL (GLOVE) IMPLANT
GLOVE EXAM NITRILE MD LF STRL (GLOVE) IMPLANT
GLOVE EXAM NITRILE XL STR (GLOVE) IMPLANT
GLOVE EXAM NITRILE XS STR PU (GLOVE) IMPLANT
GLOVE INDICATOR 7.0 STRL GRN (GLOVE) ×3 IMPLANT
GLOVE INDICATOR 7.5 STRL GRN (GLOVE) ×3 IMPLANT
GLOVE INDICATOR 8.5 STRL (GLOVE) ×6 IMPLANT
GLOVE SS N UNI LF 6.5 STRL (GLOVE) ×3 IMPLANT
GOWN STRL REUS W/ TWL LRG LVL3 (GOWN DISPOSABLE) IMPLANT
GOWN STRL REUS W/ TWL XL LVL3 (GOWN DISPOSABLE) ×2 IMPLANT
GOWN STRL REUS W/TWL 2XL LVL3 (GOWN DISPOSABLE) ×3 IMPLANT
GOWN STRL REUS W/TWL LRG LVL3 (GOWN DISPOSABLE)
GOWN STRL REUS W/TWL XL LVL3 (GOWN DISPOSABLE) ×4
GRAFT DURAGEN MATRIX 2WX2L ×3 IMPLANT
KIT BASIN OR (CUSTOM PROCEDURE TRAY) ×3 IMPLANT
KIT INFUSE LRG II (Orthopedic Implant) ×3 IMPLANT
KIT ROOM TURNOVER OR (KITS) ×3 IMPLANT
LIQUID BAND (GAUZE/BANDAGES/DRESSINGS) ×3 IMPLANT
MILL MEDIUM DISP (BLADE) ×3 IMPLANT
NEEDLE HYPO 21X1.5 SAFETY (NEEDLE) IMPLANT
NEEDLE HYPO 25X1 1.5 SAFETY (NEEDLE) ×3 IMPLANT
NS IRRIG 1000ML POUR BTL (IV SOLUTION) ×3 IMPLANT
PACK LAMINECTOMY NEURO (CUSTOM PROCEDURE TRAY) ×3 IMPLANT
PAD ARMBOARD 7.5X6 YLW CONV (MISCELLANEOUS) ×9 IMPLANT
PATTIES SURGICAL 1X1 (DISPOSABLE) ×3 IMPLANT
ROD (Rod) ×6 IMPLANT
SCREW CREO 6.5X40 (Screw) ×24 IMPLANT
SCREW PA THRD CREO TULIP 5.5X4 (Head) ×24 IMPLANT
SCREW POLY THRD CREO 6.5X40 (Screw) ×6 IMPLANT
SPONGE LAP 4X18 X RAY DECT (DISPOSABLE) IMPLANT
SPONGE SURGIFOAM ABS GEL 100 (HEMOSTASIS) ×3 IMPLANT
STRIP BIOACTIVE 20CC 25X100X8 (Miscellaneous) ×6 IMPLANT
STRIP CLOSURE SKIN 1/2X4 (GAUZE/BANDAGES/DRESSINGS) ×2 IMPLANT
STRIP VITOSS 25X100X4MM (Neuro Prosthesis/Implant) ×3 IMPLANT
STRIP VITOSS 25X50X4MM (Neuro Prosthesis/Implant) ×6 IMPLANT
SUT PROLENE 6 0 BV (SUTURE) ×3 IMPLANT
SUT VIC AB 0 CT1 18XCR BRD8 (SUTURE) ×2 IMPLANT
SUT VIC AB 0 CT1 8-18 (SUTURE) ×4
SUT VIC AB 2-0 CT1 18 (SUTURE) ×9 IMPLANT
SUT VIC AB 4-0 PS2 27 (SUTURE) ×3 IMPLANT
SYR 20CC LL (SYRINGE) IMPLANT
TOWEL OR 17X24 6PK STRL BLUE (TOWEL DISPOSABLE) ×3 IMPLANT
TOWEL OR 17X26 10 PK STRL BLUE (TOWEL DISPOSABLE) ×3 IMPLANT
TRAY FOLEY W/METER SILVER 14FR (SET/KITS/TRAYS/PACK) IMPLANT
WATER STERILE IRR 1000ML POUR (IV SOLUTION) ×3 IMPLANT

## 2015-07-31 NOTE — Progress Notes (Signed)
Patient back from OR and placed on ventilator.
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
## 2015-07-31 NOTE — Clinical Documentation Improvement (Signed)
Trauma  Based on the clinical findings below, please document any associated diagnoses/conditions the patient has or may have.   Cerebral Edema  Other  Clinically Undetermined  Supporting Information: (As per Head CT) "Small amount of low-density vasogenic edema surrounding the hematomas "  Please update your documentation within the medical record to reflect your response to this query. Thank you.  Please exercise your independent, professional judgment when responding. A specific answer is not anticipated or expected.  Thank You, Nevin BloodgoodJoan B Aqib Lough, RN, BSN, CCDS,Clinical Documentation Specialist:  [phone]  [phone]=[facility] [place] [facility]- Health Information Management

## 2015-07-31 NOTE — Transfer of Care (Signed)
Immediate Anesthesia Transfer of Care Note  Patient: William NordmannDonald Wayne Shelton  Procedure(s) Performed: Procedure(s): SPINAL FUSION THORACIC TEN TO LUMBAR THREE WITH SCREWS ,RODS AND DECOMPRESSION OF LUMBAR ONE (N/A)  Patient Location: ICU  Anesthesia Type:General  Level of Consciousness: Patient remains intubated per anesthesia plan  Airway & Oxygen Therapy: Patient remains intubated per anesthesia plan and Patient placed on Ventilator (see vital sign flow sheet for setting)  Post-op Assessment: Report given to RN  Post vital signs: stable  Last Vitals:  Filed Vitals:   07/31/15 1145 07/31/15 1153  BP: 100/59   Pulse: 87 87  Temp: 37.3 C 37.3 C  Resp: 18 18    Complications: No apparent anesthesia complications

## 2015-07-31 NOTE — Progress Notes (Signed)
Notified Dr. Wynetta Emeryram on difference in neuro status. Order received for CT scan repeat this morning. Will monitor.
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
## 2015-07-31 NOTE — Progress Notes (Signed)
Patient ID: [name], unknown   DOB: November 02, 1965, 50 y.o.   MRN: [id number] I spoke with his wife at the bedside. Violeta GelinasBurke Lanore Renderos, MD, MPH, FACS Trauma: [phone] General Surgery: [phone]
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
## 2015-07-31 NOTE — Progress Notes (Signed)
ANTIBIOTIC CONSULT NOTE - INITIAL  Pharmacy Consult for cefazolin Indication: surgical prophylaxis  No Known Allergies  Patient Measurements: Height: 5\' 11"  (180.3 cm) Weight: 213 lb 13.5 oz (97 kg) IBW/kg (Calculated) : 70.8 Adjusted Body Weight: n/a  Vital Signs: Temp: 99.1 F (37.3 C) (04/10 1153) Temp Source: Core (Comment) (04/10 1153) BP: 115/85 mmHg (04/10 1855) Pulse Rate: 102 (04/10 1855) Intake/Output from previous day: 04/09 0701 - 04/10 0700 In: 5273.4 [I.V.:4273.4; IV Piggyback:1000] Out: 1600 [Urine:1600] Intake/Output from this shift:    Labs:  Recent Labs  07/30/15 0157  07/30/15 0735 07/30/15 1820 07/30/15 2101 07/31/15 0555 07/31/15 1414  WBC  --   < > 14.8* 9.0 7.9 9.7  --   HGB 16.7  16.7  < > 10.6* 9.6* 8.8* 8.5* 8.8*  PLT  --   < > 149* 102* 96* 91*  --   CREATININE 1.20  1.20  --  1.21  --   --  0.96  --   < > = values in this interval not displayed. CrCl cannot be calculated (Only Male [1] and Male [2] are supported.). No results for input(s): VANCOTROUGH, VANCOPEAK, VANCORANDOM, GENTTROUGH, GENTPEAK, GENTRANDOM, TOBRATROUGH, TOBRAPEAK, TOBRARND, AMIKACINPEAK, AMIKACINTROU, AMIKACIN in the last 72 hours.   Microbiology: Recent Results (from the past 720 hour(s))  MRSA PCR Screening     Status: None   Collection Time: 07/30/15  1:48 PM  Result Value Ref Range Status   MRSA by PCR NEGATIVE NEGATIVE Final    Comment:        The GeneXpert MRSA Assay (FDA approved for NASAL specimens only), is one component of a comprehensive MRSA colonization surveillance program. It is not intended to diagnose MRSA infection nor to guide or monitor treatment for MRSA infections.     Medical History: Past Medical History  Diagnosis Date  . Medical history non-contributory     Assessment: 50 yo male s/p spinal surgery, pharmacy asked to dose cefazolin post-op for surgical prophylaxis.  Received dose in OR ~ 1330 PM.  Goal of Therapy:   appropriate dosing  Plan:  1. Start cefazolin 2g IV q 8 hrs. 2. Will f/u for planned duration of therapy.  Tad MooreJessica Yanixan Mellinger, Pharm D, BCPS  Clinical Pharmacist Pager [phone]  07/31/2015 7:23 PM

## 2015-07-31 NOTE — Anesthesia Postprocedure Evaluation (Signed)
Anesthesia Post Note  Patient: Julien NordmannDonald Wayne Estala  Procedure(s) Performed: Procedure(s) (LRB): SPINAL FUSION THORACIC TEN TO LUMBAR THREE WITH SCREWS ,RODS AND DECOMPRESSION OF LUMBAR ONE (N/A)  Patient location during evaluation: SICU Anesthesia Type: General Level of consciousness: sedated and patient remains intubated per anesthesia plan Pain management: pain level controlled Vital Signs Assessment: post-procedure vital signs reviewed and stable Respiratory status: patient remains intubated per anesthesia plan and patient on ventilator - see flowsheet for VS Cardiovascular status: stable Anesthetic complications: no    Last Vitals:  Filed Vitals:   07/31/15 1915 07/31/15 2000  BP: 101/69   Pulse: 94   Temp:  36.6 C  Resp: 0     Last Pain:  Filed Vitals:   07/31/15 2010  PainSc: 0-No pain                 Cecile HearingStephen Edward Turk

## 2015-07-31 NOTE — Anesthesia Procedure Notes (Signed)
Date/Time: 07/31/2015 1:09 PM Performed by: Rogelia BogaMUELLER, Azyiah Bo P Oxygen Delivery Method: Circle system utilized Intubation Type: Inhalational induction Placement Confirmation: positive ETCO2 and breath sounds checked- equal and bilateral Comments: 7.5 ETT IN SITU, VSS

## 2015-07-31 NOTE — Progress Notes (Signed)
Subjective: Intubated, on vent  Objective: Vital signs in last 24 hours: Temp:  [99.1 F (37.3 C)-102.6 F (39.2 C)] 99.1 F (37.3 C) (04/10 1153) Pulse Rate:  [68-141] 87 (04/10 1153) Resp:  [0-33] 18 (04/10 1153) BP: (62-202)/(38-148) 100/59 mmHg (04/10 1145) SpO2:  [93 %-100 %] 96 % (04/10 1153) Arterial Line BP: (55-219)/(27-122) 96/48 mmHg (04/10 1153) FiO2 (%):  [40 %-50 %] 40 % (04/10 1120)  Physical Exam  Head: Normocephalic. Head is with laceration. Head is without raccoon's eyes, without Battle's sign Nose: No significant deformity. Mucosa normal. Ears: Right laceration repair is intact. The left ear is normal. Mouth/Throat: ET tube in place. Eyes: Conjunctivae are normal. Neck: No tracheal deviation present. No thyromegaly present.  C-collar in place  Skin: Skin is warm and dry.  Nursing note and vitals reviewed.   Recent Labs  07/30/15 2101 07/31/15 0555  WBC 7.9 9.7  HGB 8.8* 8.5*  HCT 25.8* 23.8*  PLT 96* 91*    Recent Labs  07/30/15 0735 07/31/15 0555  NA 136 139  K 3.9 4.1  CL 107 110  CO2 14* 20*  GLUCOSE 173* 149*  BUN 10 16  CREATININE 1.21 0.96  CALCIUM 6.9* 7.7*    Medications:  I have reviewed the patient's current medications. Scheduled: . sodium chloride   Intravenous Once  . antiseptic oral rinse  7 mL Mouth Rinse 10 times per day  . chlorhexidine gluconate (SAGE KIT)  15 mL Mouth Rinse BID  . ipratropium-albuterol  3 mL Nebulization Q6H  . pantoprazole  40 mg Oral Daily   Or  . pantoprazole (PROTONIX) IV  40 mg Intravenous Daily  . sodium chloride flush  10-40 mL Intracatheter Q12H  . vancomycin      . vancomycin       HUD:JSHFWYOVZCHYI, fentaNYL (SUBLIMAZE) injection, midazolam, midazolam, ondansetron **OR** ondansetron (ZOFRAN) IV, sodium chloride flush  Assessment/Plan: Complex right auricular and ear canal lacerations, s/p repair in ER. Minimally displaced fractures of the left medial orbit, zygomatic arch,  zygomaticomaxillary complex and the nasal bones.  The minimally displaced facial fractures likely will not need surgical intervention. Will remove sutures in approximately 1 week.    LOS: 1 day   [name],SUI W 07/31/2015, 12:42 PM
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
## 2015-08-01 ENCOUNTER — Inpatient Hospital Stay (HOSPITAL_COMMUNITY): Payer: BLUE CROSS/BLUE SHIELD

## 2015-08-01 ENCOUNTER — Encounter (HOSPITAL_COMMUNITY): Payer: Self-pay | Admitting: Neurosurgery

## 2015-08-01 LAB — BASIC METABOLIC PANEL
ANION GAP: 9 (ref 5–15)
BUN: 15 mg/dL (ref 6–20)
CALCIUM: 7.5 mg/dL — AB (ref 8.9–10.3)
CO2: 20 mmol/L — ABNORMAL LOW (ref 22–32)
Chloride: 112 mmol/L — ABNORMAL HIGH (ref 101–111)
Creatinine, Ser: 0.89 mg/dL (ref 0.61–1.24)
GLUCOSE: 140 mg/dL — AB (ref 65–99)
POTASSIUM: 3.9 mmol/L (ref 3.5–5.1)
SODIUM: 141 mmol/L (ref 135–145)

## 2015-08-01 LAB — TYPE AND SCREEN
ABO/RH(D): A POS
ANTIBODY SCREEN: NEGATIVE
UNIT DIVISION: 0
UNIT DIVISION: 0
UNIT DIVISION: 0
Unit division: 0
Unit division: 0
Unit division: 0
Unit division: 0

## 2015-08-01 LAB — POCT I-STAT 7, (LYTES, BLD GAS, ICA,H+H)
ACID-BASE DEFICIT: 4 mmol/L — AB (ref 0.0–2.0)
Acid-base deficit: 5 mmol/L — ABNORMAL HIGH (ref 0.0–2.0)
Bicarbonate: 20.1 meq/L (ref 20.0–24.0)
Bicarbonate: 21.8 meq/L (ref 20.0–24.0)
CALCIUM ION: 1.13 mmol/L (ref 1.12–1.23)
CALCIUM ION: 1.09 mmol/L — AB (ref 1.12–1.23)
HEMATOCRIT: 22 % — AB (ref 39.0–52.0)
HEMATOCRIT: 29 % — AB (ref 39.0–52.0)
HEMOGLOBIN: 7.5 g/dL — AB (ref 13.0–17.0)
HEMOGLOBIN: 9.9 g/dL — AB (ref 13.0–17.0)
O2 SAT: 99 %
O2 Saturation: 98 %
PCO2 ART: 37.6 mmHg (ref 35.0–45.0)
PH ART: 7.293 — AB (ref 7.350–7.450)
POTASSIUM: 4.4 mmol/L (ref 3.5–5.1)
POTASSIUM: 4.8 mmol/L (ref 3.5–5.1)
Patient temperature: 37.5
SODIUM: 140 mmol/L (ref 135–145)
Sodium: 141 mmol/L (ref 135–145)
TCO2: 21 mmol/L (ref 0–100)
TCO2: 23 mmol/L (ref 0–100)
pCO2 arterial: 45.4 mmHg — ABNORMAL HIGH (ref 35.0–45.0)
pH, Arterial: 7.339 — ABNORMAL LOW (ref 7.350–7.450)
pO2, Arterial: 116 mmHg — ABNORMAL HIGH (ref 80.0–100.0)
pO2, Arterial: 163 mmHg — ABNORMAL HIGH (ref 80.0–100.0)

## 2015-08-01 LAB — CBC
HEMATOCRIT: 26.5 % — AB (ref 39.0–52.0)
Hemoglobin: 9 g/dL — ABNORMAL LOW (ref 13.0–17.0)
MCH: 30.9 pg (ref 26.0–34.0)
MCHC: 34 g/dL (ref 30.0–36.0)
MCV: 91.1 fL (ref 78.0–100.0)
PLATELETS: 73 10*3/uL — AB (ref 150–400)
RBC: 2.91 MIL/uL — AB (ref 4.22–5.81)
RDW: 16.6 % — AB (ref 11.5–15.5)
WBC: 9.3 10*3/uL (ref 4.0–10.5)

## 2015-08-01 LAB — GLUCOSE, CAPILLARY
GLUCOSE-CAPILLARY: 129 mg/dL — AB (ref 65–99)
Glucose-Capillary: 120 mg/dL — ABNORMAL HIGH (ref 65–99)
Glucose-Capillary: 124 mg/dL — ABNORMAL HIGH (ref 65–99)

## 2015-08-01 MED ORDER — DEXMEDETOMIDINE HCL IN NACL 400 MCG/100ML IV SOLN
0.4000 ug/kg/h | INTRAVENOUS | Status: DC
  Administered 2015-08-01: 1.2 ug/kg/h via INTRAVENOUS
  Administered 2015-08-01: 0.802 ug/kg/h via INTRAVENOUS
  Administered 2015-08-01: 0.8 ug/kg/h via INTRAVENOUS
  Administered 2015-08-01: 1.2 ug/kg/h via INTRAVENOUS
  Administered 2015-08-01: 0.802 ug/kg/h via INTRAVENOUS
  Administered 2015-08-02: 1.2 ug/kg/h via INTRAVENOUS
  Filled 2015-08-01 (×7): qty 100

## 2015-08-01 MED ORDER — PNEUMOCOCCAL VAC POLYVALENT 25 MCG/0.5ML IJ INJ
0.5000 mL | INJECTION | INTRAMUSCULAR | Status: AC
  Administered 2015-08-02: 0.5 mL via INTRAMUSCULAR
  Filled 2015-08-01: qty 0.5

## 2015-08-01 MED ORDER — PIVOT 1.5 CAL PO LIQD: 1000.0000 mL | ORAL | Status: DC

## 2015-08-01 MED ORDER — PIVOT 1.5 CAL PO LIQD
1000.0000 mL | ORAL | Status: DC
  Administered 2015-08-01 – 2015-08-06 (×7): 1000 mL
  Filled 2015-08-01 (×2): qty 1000
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
## 2015-08-02 ENCOUNTER — Inpatient Hospital Stay (HOSPITAL_COMMUNITY): Payer: BLUE CROSS/BLUE SHIELD

## 2015-08-02 DIAGNOSIS — D62 Acute posthemorrhagic anemia: Secondary | ICD-10-CM | POA: Diagnosis not present

## 2015-08-02 DIAGNOSIS — S069X9A Unspecified intracranial injury with loss of consciousness of unspecified duration, initial encounter: Secondary | ICD-10-CM | POA: Diagnosis present

## 2015-08-02 DIAGNOSIS — S12500A Unspecified displaced fracture of sixth cervical vertebra, initial encounter for closed fracture: Secondary | ICD-10-CM | POA: Diagnosis present

## 2015-08-02 DIAGNOSIS — S22008A Other fracture of unspecified thoracic vertebra, initial encounter for closed fracture: Secondary | ICD-10-CM | POA: Diagnosis present

## 2015-08-02 DIAGNOSIS — S0292XA Unspecified fracture of facial bones, initial encounter for closed fracture: Secondary | ICD-10-CM | POA: Diagnosis present

## 2015-08-02 DIAGNOSIS — S32009A Unspecified fracture of unspecified lumbar vertebra, initial encounter for closed fracture: Secondary | ICD-10-CM | POA: Diagnosis present

## 2015-08-02 DIAGNOSIS — F1010 Alcohol abuse, uncomplicated: Secondary | ICD-10-CM | POA: Diagnosis present

## 2015-08-02 DIAGNOSIS — J9600 Acute respiratory failure, unspecified whether with hypoxia or hypercapnia: Secondary | ICD-10-CM | POA: Diagnosis present

## 2015-08-02 DIAGNOSIS — S2243XA Multiple fractures of ribs, bilateral, initial encounter for closed fracture: Secondary | ICD-10-CM | POA: Diagnosis present

## 2015-08-02 DIAGNOSIS — S270XXA Traumatic pneumothorax, initial encounter: Secondary | ICD-10-CM | POA: Diagnosis present

## 2015-08-02 DIAGNOSIS — S37012A Minor contusion of left kidney, initial encounter: Secondary | ICD-10-CM | POA: Diagnosis present

## 2015-08-02 DIAGNOSIS — S069XAA Unspecified intracranial injury with loss of consciousness status unknown, initial encounter: Secondary | ICD-10-CM | POA: Diagnosis present

## 2015-08-02 DIAGNOSIS — S01311A Laceration without foreign body of right ear, initial encounter: Secondary | ICD-10-CM | POA: Diagnosis present

## 2015-08-02 DIAGNOSIS — S36039A Unspecified laceration of spleen, initial encounter: Secondary | ICD-10-CM | POA: Diagnosis present

## 2015-08-02 LAB — URINALYSIS, ROUTINE W REFLEX MICROSCOPIC
Bilirubin Urine: NEGATIVE
Glucose, UA: NEGATIVE mg/dL
Ketones, ur: NEGATIVE mg/dL
LEUKOCYTES UA: NEGATIVE
NITRITE: NEGATIVE
PH: 5.5 (ref 5.0–8.0)
Protein, ur: 100 mg/dL — AB
SPECIFIC GRAVITY, URINE: 1.023 (ref 1.005–1.030)

## 2015-08-02 LAB — CBC
HCT: 22.8 % — ABNORMAL LOW (ref 39.0–52.0)
Hemoglobin: 7.6 g/dL — ABNORMAL LOW (ref 13.0–17.0)
MCH: 30.9 pg (ref 26.0–34.0)
MCHC: 33.3 g/dL (ref 30.0–36.0)
MCV: 92.7 fL (ref 78.0–100.0)
PLATELETS: 72 10*3/uL — AB (ref 150–400)
RBC: 2.46 MIL/uL — ABNORMAL LOW (ref 4.22–5.81)
RDW: 15.9 % — AB (ref 11.5–15.5)
WBC: 7.6 10*3/uL (ref 4.0–10.5)

## 2015-08-02 LAB — BASIC METABOLIC PANEL
Anion gap: 6 (ref 5–15)
BUN: 12 mg/dL (ref 6–20)
CALCIUM: 7.5 mg/dL — AB (ref 8.9–10.3)
CHLORIDE: 115 mmol/L — AB (ref 101–111)
CO2: 22 mmol/L (ref 22–32)
CREATININE: 0.8 mg/dL (ref 0.61–1.24)
GFR calc Af Amer: >60 mL/min (ref >60)
GFR calc non Af Amer: >60 mL/min (ref >60)
Glucose, Bld: 145 mg/dL — ABNORMAL HIGH (ref 65–99)
Potassium: 3.4 mmol/L — ABNORMAL LOW (ref 3.5–5.1)
SODIUM: 143 mmol/L (ref 135–145)

## 2015-08-02 LAB — GLUCOSE, CAPILLARY
GLUCOSE-CAPILLARY: 124 mg/dL — AB (ref 65–99)
GLUCOSE-CAPILLARY: 126 mg/dL — AB (ref 65–99)
GLUCOSE-CAPILLARY: 141 mg/dL — AB (ref 65–99)
Glucose-Capillary: 139 mg/dL — ABNORMAL HIGH (ref 65–99)
Glucose-Capillary: 125 mg/dL — ABNORMAL HIGH (ref 65–99)

## 2015-08-02 LAB — URINE MICROSCOPIC-ADD ON

## 2015-08-02 MED ORDER — METOPROLOL TARTRATE 25 MG/10 ML ORAL SUSPENSION
50.0000 mg | Freq: Two times a day (BID) | ORAL | Status: DC
  Administered 2015-08-02 – 2015-08-19 (×34): 50 mg
  Filled 2015-08-02 (×36): qty 20

## 2015-08-02 MED ORDER — FUROSEMIDE 10 MG/ML PO SOLN
20.0000 mg | Freq: Two times a day (BID) | ORAL | Status: DC
  Administered 2015-08-02 – 2015-08-04 (×3): 20 mg
  Filled 2015-08-02 (×6): qty 2

## 2015-08-02 MED ORDER — DEXMEDETOMIDINE HCL IN NACL 400 MCG/100ML IV SOLN
0.4000 ug/kg/h | INTRAVENOUS | Status: DC
  Administered 2015-08-02: 1.2 ug/kg/h via INTRAVENOUS
  Administered 2015-08-02: 0.6 ug/kg/h via INTRAVENOUS
  Administered 2015-08-02: 0.4 ug/kg/h via INTRAVENOUS
  Administered 2015-08-02: 1.1 ug/kg/h via INTRAVENOUS
  Administered 2015-08-02: 1.2 ug/kg/h via INTRAVENOUS
  Administered 2015-08-02: 0.8 ug/kg/h via INTRAVENOUS
  Administered 2015-08-03: 1.1 ug/kg/h via INTRAVENOUS
  Administered 2015-08-03 (×3): 1.2 ug/kg/h via INTRAVENOUS
  Administered 2015-08-03: 1.199 ug/kg/h via INTRAVENOUS
  Administered 2015-08-04: 1.2 ug/kg/h via INTRAVENOUS
  Administered 2015-08-04: 1 ug/kg/h via INTRAVENOUS
  Administered 2015-08-04 (×3): 1.2 ug/kg/h via INTRAVENOUS
  Administered 2015-08-05 (×2): 1 ug/kg/h via INTRAVENOUS
  Administered 2015-08-05: 0.9 ug/kg/h via INTRAVENOUS
  Administered 2015-08-05: 1 ug/kg/h via INTRAVENOUS
  Administered 2015-08-05: 0.9 ug/kg/h via INTRAVENOUS
  Administered 2015-08-06 (×2): 0.903 ug/kg/h via INTRAVENOUS
  Administered 2015-08-06: 0.9 ug/kg/h via INTRAVENOUS
  Administered 2015-08-06: 0.6 ug/kg/h via INTRAVENOUS
  Administered 2015-08-06 (×2): 0.9 ug/kg/h via INTRAVENOUS
  Administered 2015-08-07: 0.5 ug/kg/h via INTRAVENOUS
  Administered 2015-08-07: 0.7 ug/kg/h via INTRAVENOUS
  Administered 2015-08-07: 0.5 ug/kg/h via INTRAVENOUS
  Administered 2015-08-08: 0.9 ug/kg/h via INTRAVENOUS
  Administered 2015-08-08: 0.7 ug/kg/h via INTRAVENOUS
  Administered 2015-08-08 (×2): 1 ug/kg/h via INTRAVENOUS
  Administered 2015-08-08: 0.7 ug/kg/h via INTRAVENOUS
  Administered 2015-08-08 – 2015-08-09 (×2): 0.9 ug/kg/h via INTRAVENOUS
  Administered 2015-08-09: 1.2 ug/kg/h via INTRAVENOUS
  Administered 2015-08-09 (×2): 0.9 ug/kg/h via INTRAVENOUS
  Administered 2015-08-09: 1.2 ug/kg/h via INTRAVENOUS
  Administered 2015-08-10 – 2015-08-11 (×8): 1 ug/kg/h via INTRAVENOUS
  Administered 2015-08-12: 1.1 ug/kg/h via INTRAVENOUS
  Administered 2015-08-12 (×2): 1 ug/kg/h via INTRAVENOUS
  Administered 2015-08-13: 0.7 ug/kg/h via INTRAVENOUS
  Administered 2015-08-13 (×2): 1 ug/kg/h via INTRAVENOUS
  Administered 2015-08-13 – 2015-08-14 (×2): 0.7 ug/kg/h via INTRAVENOUS
  Administered 2015-08-15: 0.5 ug/kg/h via INTRAVENOUS
  Administered 2015-08-15: 0.8 ug/kg/h via INTRAVENOUS
  Administered 2015-08-15 – 2015-08-16 (×4): 0.9 ug/kg/h via INTRAVENOUS
  Administered 2015-08-16: 1 ug/kg/h via INTRAVENOUS
  Administered 2015-08-16: 0.9 ug/kg/h via INTRAVENOUS
  Administered 2015-08-16: 1 ug/kg/h via INTRAVENOUS
  Administered 2015-08-16: 0.9 ug/kg/h via INTRAVENOUS
  Administered 2015-08-17: 1 ug/kg/h via INTRAVENOUS
  Administered 2015-08-17: 1.1 ug/kg/h via INTRAVENOUS
  Administered 2015-08-17 (×4): 1 ug/kg/h via INTRAVENOUS
  Administered 2015-08-18 – 2015-08-19 (×7): 1.1 ug/kg/h via INTRAVENOUS
  Administered 2015-08-19: 1 ug/kg/h via INTRAVENOUS
  Administered 2015-08-19 (×2): 1.1 ug/kg/h via INTRAVENOUS
  Administered 2015-08-20 (×4): 1 ug/kg/h via INTRAVENOUS
  Administered 2015-08-21: 0.9 ug/kg/h via INTRAVENOUS
  Administered 2015-08-21: 1.1 ug/kg/h via INTRAVENOUS
  Administered 2015-08-21 (×2): 1 ug/kg/h via INTRAVENOUS
  Administered 2015-08-21 – 2015-08-22 (×2): 0.7 ug/kg/h via INTRAVENOUS
  Administered 2015-08-22: 0.6 ug/kg/h via INTRAVENOUS
  Administered 2015-08-22 (×3): 0.7 ug/kg/h via INTRAVENOUS
  Administered 2015-08-23: 0.6 ug/kg/h via INTRAVENOUS
  Administered 2015-08-23: 0.4 ug/kg/h via INTRAVENOUS
  Filled 2015-08-02 (×6): qty 100
  Filled 2015-08-02: qty 50
  Filled 2015-08-02 (×26): qty 100
  Filled 2015-08-02: qty 50
  Filled 2015-08-02 (×18): qty 100
  Filled 2015-08-02: qty 50
  Filled 2015-08-02 (×21): qty 100
  Filled 2015-08-02: qty 50
  Filled 2015-08-02 (×21): qty 100
  Filled 2015-08-02: qty 50
  Filled 2015-08-02 (×17): qty 100

## 2015-08-02 MED ORDER — AMANTADINE HCL 50 MG/5ML PO SYRP
100.0000 mg | ORAL_SOLUTION | Freq: Two times a day (BID) | ORAL | Status: DC
  Administered 2015-08-02 – 2015-08-15 (×27): 100 mg
  Filled 2015-08-02 (×34): qty 10

## 2015-08-02 MED ORDER — FUROSEMIDE 8 MG/ML PO SOLN
20.0000 mg | Freq: Two times a day (BID) | ORAL | Status: DC
  Filled 2015-08-02: qty 5

## 2015-08-02 MED ORDER — HALOPERIDOL LACTATE 5 MG/ML IJ SOLN: 5.0000 mg | Freq: Once | INTRAMUSCULAR | Status: DC

## 2015-08-02 MED FILL — Sodium Chloride IV Soln 0.9%: INTRAVENOUS | Qty: 1000 | Status: AC

## 2015-08-02 MED FILL — Heparin Sodium (Porcine) Inj 1000 Unit/ML: INTRAMUSCULAR | Qty: 30 | Status: AC
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
## 2015-08-02 NOTE — Progress Notes (Signed)
Pt vigorously chewing on ET tube.  Hard plastic oral airway inserted into right side of mouth to prevent further damage to ET tube.  RN aware.  RT will continue to monitor.

## 2015-08-03 ENCOUNTER — Inpatient Hospital Stay (HOSPITAL_COMMUNITY): Payer: BLUE CROSS/BLUE SHIELD

## 2015-08-03 ENCOUNTER — Encounter (HOSPITAL_COMMUNITY): Admission: EM | Disposition: A | Discharge status: Skilled Nursing Facility | Payer: Self-pay | Source: Home / Self Care

## 2015-08-03 LAB — CBC
HCT: 21 % — ABNORMAL LOW (ref 39.0–52.0)
Hemoglobin: 7.1 g/dL — ABNORMAL LOW (ref 13.0–17.0)
MCH: 31.8 pg (ref 26.0–34.0)
MCHC: 33.8 g/dL (ref 30.0–36.0)
MCV: 94.2 fL (ref 78.0–100.0)
PLATELETS: 100 10*3/uL — AB (ref 150–400)
RBC: 2.23 MIL/uL — AB (ref 4.22–5.81)
RDW: 15.9 % — ABNORMAL HIGH (ref 11.5–15.5)
WBC: 8.6 10*3/uL (ref 4.0–10.5)

## 2015-08-03 LAB — BASIC METABOLIC PANEL
Anion gap: 10 (ref 5–15)
BUN: 17 mg/dL (ref 6–20)
CO2: 22 mmol/L (ref 22–32)
CREATININE: 0.6 mg/dL — AB (ref 0.61–1.24)
Calcium: 7.6 mg/dL — ABNORMAL LOW (ref 8.9–10.3)
Chloride: 114 mmol/L — ABNORMAL HIGH (ref 101–111)
Glucose, Bld: 145 mg/dL — ABNORMAL HIGH (ref 65–99)
POTASSIUM: 3.6 mmol/L (ref 3.5–5.1)
SODIUM: 146 mmol/L — AB (ref 135–145)

## 2015-08-03 LAB — GLUCOSE, CAPILLARY
GLUCOSE-CAPILLARY: 126 mg/dL — AB (ref 65–99)
Glucose-Capillary: 120 mg/dL — ABNORMAL HIGH (ref 65–99)
Glucose-Capillary: 141 mg/dL — ABNORMAL HIGH (ref 65–99)
Glucose-Capillary: 145 mg/dL — ABNORMAL HIGH (ref 65–99)
Glucose-Capillary: 98 mg/dL (ref 65–99)

## 2015-08-03 LAB — URINE CULTURE: Culture: NO GROWTH

## 2015-08-03 SURGERY — CREATION, TRACHEOSTOMY
Anesthesia: General

## 2015-08-03 MED ORDER — ACETAMINOPHEN 160 MG/5ML PO SOLN: 650.0000 mg | ORAL | Status: DC | PRN

## 2015-08-03 MED ORDER — ACETAMINOPHEN 325 MG PO TABS: 650.0000 mg | ORAL_TABLET | ORAL | Status: DC | PRN

## 2015-08-03 MED ORDER — ACETAMINOPHEN 650 MG RE SUPP: 650.0000 mg | RECTAL | Status: DC | PRN

## 2015-08-03 MED ORDER — FREE WATER
200.0000 mL | Freq: Three times a day (TID) | Status: DC
  Administered 2015-08-03 – 2015-08-05 (×7): 200 mL

## 2015-08-03 MED ORDER — PRO-STAT SUGAR FREE PO LIQD
30.0000 mL | Freq: Every day | ORAL | Status: DC
  Administered 2015-08-03 – 2015-09-12 (×39): 30 mL via ORAL
  Filled 2015-08-03 (×39): qty 30

## 2015-08-03 MED ORDER — LORAZEPAM 2 MG/ML IJ SOLN
2.0000 mg | INTRAMUSCULAR | Status: DC | PRN
  Administered 2015-08-03 – 2015-08-18 (×19): 2 mg via INTRAVENOUS
  Filled 2015-08-03 (×18): qty 1

## 2015-08-03 MED ORDER — LORAZEPAM 2 MG/ML IJ SOLN
INTRAMUSCULAR | Status: AC
  Filled 2015-08-03: qty 1

## 2015-08-03 MED ORDER — FENTANYL CITRATE (PF) 2500 MCG/50ML IJ SOLN
25.0000 ug/h | INTRAMUSCULAR | Status: DC
  Administered 2015-08-03: 25 ug/h via INTRAVENOUS
  Administered 2015-08-04 – 2015-08-05 (×2): 200 ug/h via INTRAVENOUS
  Administered 2015-08-06: 25 ug/h via INTRAVENOUS
  Filled 2015-08-03 (×4): qty 50
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
## 2015-08-03 NOTE — Progress Notes (Signed)
Patient ID: Julien NordmannDonald Wayne Burger, male   DOB: Aug 27, 1965, 50 y.o.   MRN: [id number] Patient remains sedated and intubated  Minimal withdrawal to stimulation on sedation  Ventilator per trauma mobilize when able.

## 2015-08-04 ENCOUNTER — Inpatient Hospital Stay (HOSPITAL_COMMUNITY): Payer: BLUE CROSS/BLUE SHIELD

## 2015-08-04 DIAGNOSIS — J9601 Acute respiratory failure with hypoxia: Secondary | ICD-10-CM

## 2015-08-04 LAB — CBC
HCT: 20.3 % — ABNORMAL LOW (ref 39.0–52.0)
Hemoglobin: 6.7 g/dL — CL (ref 13.0–17.0)
MCH: 31.8 pg (ref 26.0–34.0)
MCHC: 33 g/dL (ref 30.0–36.0)
MCV: 96.2 fL (ref 78.0–100.0)
PLATELETS: 139 10*3/uL — AB (ref 150–400)
RBC: 2.11 MIL/uL — AB (ref 4.22–5.81)
RDW: 15.9 % — ABNORMAL HIGH (ref 11.5–15.5)
WBC: 8.1 10*3/uL (ref 4.0–10.5)

## 2015-08-04 LAB — BASIC METABOLIC PANEL
ANION GAP: 8 (ref 5–15)
BUN: 17 mg/dL (ref 6–20)
CO2: 24 mmol/L (ref 22–32)
Calcium: 7.5 mg/dL — ABNORMAL LOW (ref 8.9–10.3)
Chloride: 116 mmol/L — ABNORMAL HIGH (ref 101–111)
Creatinine, Ser: 0.66 mg/dL (ref 0.61–1.24)
Glucose, Bld: 141 mg/dL — ABNORMAL HIGH (ref 65–99)
POTASSIUM: 3.4 mmol/L — AB (ref 3.5–5.1)
SODIUM: 148 mmol/L — AB (ref 135–145)

## 2015-08-04 LAB — GLUCOSE, CAPILLARY
GLUCOSE-CAPILLARY: 113 mg/dL — AB (ref 65–99)
GLUCOSE-CAPILLARY: 136 mg/dL — AB (ref 65–99)
GLUCOSE-CAPILLARY: 139 mg/dL — AB (ref 65–99)
GLUCOSE-CAPILLARY: 142 mg/dL — AB (ref 65–99)
GLUCOSE-CAPILLARY: 151 mg/dL — AB (ref 65–99)
Glucose-Capillary: 148 mg/dL — ABNORMAL HIGH (ref 65–99)
Glucose-Capillary: 135 mg/dL — ABNORMAL HIGH (ref 65–99)

## 2015-08-04 LAB — CULTURE, RESPIRATORY W GRAM STAIN

## 2015-08-04 LAB — PREPARE RBC (CROSSMATCH)

## 2015-08-04 LAB — LACTIC ACID, PLASMA: LACTIC ACID, VENOUS: 1.8 mmol/L (ref 0.5–2.0)

## 2015-08-04 LAB — CULTURE, RESPIRATORY

## 2015-08-04 MED ORDER — DEXTROSE 5 % IV SOLN
2.0000 g | Freq: Three times a day (TID) | INTRAVENOUS | Status: AC
  Administered 2015-08-04 – 2015-08-13 (×27): 2 g via INTRAVENOUS
  Filled 2015-08-04 (×27): qty 2

## 2015-08-04 MED ORDER — FUROSEMIDE 10 MG/ML PO SOLN
40.0000 mg | Freq: Two times a day (BID) | ORAL | Status: DC
  Administered 2015-08-04 – 2015-08-13 (×18): 40 mg
  Filled 2015-08-04 (×25): qty 4

## 2015-08-04 MED ORDER — ACETAMINOPHEN 160 MG/5ML PO SOLN
650.0000 mg | Freq: Four times a day (QID) | ORAL | Status: DC | PRN
  Administered 2015-08-04 – 2015-09-20 (×37): 650 mg
  Filled 2015-08-04 (×37): qty 20.3

## 2015-08-04 MED ORDER — VANCOMYCIN HCL IN DEXTROSE 1-5 GM/200ML-% IV SOLN
1000.0000 mg | Freq: Three times a day (TID) | INTRAVENOUS | Status: DC
  Administered 2015-08-04 – 2015-08-06 (×5): 1000 mg via INTRAVENOUS
  Filled 2015-08-04 (×7): qty 200

## 2015-08-04 MED ORDER — SODIUM CHLORIDE 0.9 % IV SOLN
2000.0000 mg | Freq: Once | INTRAVENOUS | Status: AC
  Administered 2015-08-04: 2000 mg via INTRAVENOUS
  Filled 2015-08-04: qty 2000

## 2015-08-04 MED ORDER — POTASSIUM CHLORIDE 10 MEQ/50ML IV SOLN
10.0000 meq | INTRAVENOUS | Status: AC
  Administered 2015-08-04 (×3): 10 meq via INTRAVENOUS
  Filled 2015-08-04 (×3): qty 50

## 2015-08-04 NOTE — Progress Notes (Signed)
Pharmacy Antibiotic Note  William NordmannDonald Wayne Logan is a 50 y.o. male admitted on 07/30/2015 with pneumonia.  Pharmacy has been consulted for vancomycin and cefepime dosing.  Plan: Vancomycin 2g IV x1 as loading dose, then 1g IV every 8 hours.  Goal trough 15-20 mcg/mL.  Cefepime 2g IV q8h Follow progression, c/s, renal function, trough at SS  Height: 5\' 11"  (180.3 cm) Weight: 243 lb 13.3 oz (110.6 kg) IBW/kg (Calculated) : 75.3  Temp (24hrs), Avg:99.5 F (37.5 C), Min:98.4 F (36.9 C), Max:101.7 F (38.7 C)   Recent Labs Lab 07/30/15 0157  07/31/15 0555 08/01/15 0423 08/02/15 08/03/15 0353 08/04/15 0510  WBC  --   < > 9.7 9.3 7.6 8.6 8.1  CREATININE 1.20  1.20  < > 0.96 0.89 0.80 0.60* 0.66  LATICACIDVEN 3.61*  3.61*  --   --   --   --   --   --   < > = values in this interval not displayed.  Estimated Creatinine Clearance: 141.2 mL/min (by C-G formula based on Cr of 0.66).    No Known Allergies  Antimicrobials this admission: Ancef 4/10>>4/14 Vanc 4/14>> Cefepime 4/14>>  Dose adjustments this admission: N/a  Microbiology results: 4/12 BCx: ngtd 4/12 urine: neg 4/12 TA: pending  Thank you for allowing pharmacy to be a part of this patient's care.  Shakya Sebring D. Shiana Rappleye, PharmD, BCPS Clinical Pharmacist Pager: [phone] 08/04/2015 9:08 AM

## 2015-08-04 NOTE — Progress Notes (Signed)
Pt w/ incr resp rate to 30-31 (vent rate 18), incr peak pressure to 45, incr WOB with use of abdominal muscles, good breath sounds throughout but tight w/ some wheezes.   CPOT incr to 5 w/  Grimacing.  PRN Ativan given & fentanyl bolused per order w/o improvement/.  Dr. Derrell Lollingamirez notified, order for additional analgesic received.
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
## 2015-08-04 NOTE — Progress Notes (Signed)
SLP Cancellation Note  Patient Details Name: William NordmannDonald Wayne Schlosser MRN: [id number] DOB: 06-Oct-1965   Cancelled treatment:       Reason Eval/Treat Not Completed: Other (comment). Will plan to address with TBI team on 4/17.    Abel Hageman, Riley NearingBonnie Caroline 08/04/2015, 1:35 PM

## 2015-08-04 NOTE — Progress Notes (Signed)
Patient ID: William NordmannDonald Wayne Logan, male   DOB: 1965/07/26, 50 y.o.   MRN: [id number] Sedated and intubated. No change. Following.

## 2015-08-04 NOTE — Progress Notes (Signed)
CRITICAL VALUE ALERT  Critical value received:  6.7 Hgb  Date of notification:  08/04/2015  Time of notification: 0526  Critical value read back:Yes.    Nurse who received alert:  Alphia Mohevon Imanii Gosdin  MD notified (1st page):  Violeta GelinasBurke Thompson  Time of first page:  0527  MD notified (2nd page):  Time of second page:  Responding MD:  Violeta GelinasBurke Thompson   Time MD responded:  [id number]
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
## 2015-08-05 ENCOUNTER — Inpatient Hospital Stay (HOSPITAL_COMMUNITY): Payer: BLUE CROSS/BLUE SHIELD

## 2015-08-05 LAB — GLUCOSE, CAPILLARY
GLUCOSE-CAPILLARY: 108 mg/dL — AB (ref 65–99)
GLUCOSE-CAPILLARY: 114 mg/dL — AB (ref 65–99)
GLUCOSE-CAPILLARY: 118 mg/dL — AB (ref 65–99)
GLUCOSE-CAPILLARY: 124 mg/dL — AB (ref 65–99)
GLUCOSE-CAPILLARY: 126 mg/dL — AB (ref 65–99)
GLUCOSE-CAPILLARY: 134 mg/dL — AB (ref 65–99)

## 2015-08-05 LAB — BASIC METABOLIC PANEL
ANION GAP: 9 (ref 5–15)
BUN: 21 mg/dL — ABNORMAL HIGH (ref 6–20)
CALCIUM: 7.9 mg/dL — AB (ref 8.9–10.3)
CO2: 27 mmol/L (ref 22–32)
CREATININE: 0.7 mg/dL (ref 0.61–1.24)
Chloride: 114 mmol/L — ABNORMAL HIGH (ref 101–111)
GLUCOSE: 132 mg/dL — AB (ref 65–99)
Potassium: 3.7 mmol/L (ref 3.5–5.1)
Sodium: 150 mmol/L — ABNORMAL HIGH (ref 135–145)

## 2015-08-05 LAB — CBC
HCT: 24.3 % — ABNORMAL LOW (ref 39.0–52.0)
Hemoglobin: 7.8 g/dL — ABNORMAL LOW (ref 13.0–17.0)
MCH: 30.7 pg (ref 26.0–34.0)
MCHC: 32.1 g/dL (ref 30.0–36.0)
MCV: 95.7 fL (ref 78.0–100.0)
PLATELETS: 169 10*3/uL (ref 150–400)
RBC: 2.54 MIL/uL — ABNORMAL LOW (ref 4.22–5.81)
RDW: 18.2 % — AB (ref 11.5–15.5)
WBC: 8.8 10*3/uL (ref 4.0–10.5)

## 2015-08-05 LAB — TYPE AND SCREEN
ABO/RH(D): A POS
ANTIBODY SCREEN: NEGATIVE
UNIT DIVISION: 0

## 2015-08-05 MED ORDER — FREE WATER
200.0000 mL | Freq: Four times a day (QID) | Status: DC
Start: 2015-08-05 — End: 2015-08-10
  Administered 2015-08-05 – 2015-08-10 (×20): 200 mL

## 2015-08-05 MED ORDER — DOCUSATE SODIUM 50 MG/5ML PO LIQD
100.0000 mg | Freq: Two times a day (BID) | ORAL | Status: DC
  Administered 2015-08-05 – 2015-08-22 (×12): 100 mg via ORAL
  Filled 2015-08-05 (×14): qty 10

## 2015-08-05 MED ORDER — BISACODYL 10 MG RE SUPP
10.0000 mg | Freq: Every day | RECTAL | Status: DC | PRN
  Administered 2015-08-05: 10 mg via RECTAL
  Filled 2015-08-05 (×2): qty 1

## 2015-08-05 MED ORDER — SODIUM CHLORIDE 0.45 % IV SOLN
INTRAVENOUS | Status: DC
  Administered 2015-08-05 – 2015-08-17 (×5): via INTRAVENOUS
  Filled 2015-08-05 (×10): qty 1000
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
## 2015-08-06 LAB — BASIC METABOLIC PANEL
Anion gap: 7 (ref 5–15)
BUN: 20 mg/dL (ref 6–20)
CHLORIDE: 110 mmol/L (ref 101–111)
CO2: 28 mmol/L (ref 22–32)
CREATININE: 0.65 mg/dL (ref 0.61–1.24)
Calcium: 7.8 mg/dL — ABNORMAL LOW (ref 8.9–10.3)
GFR calc non Af Amer: >60 mL/min (ref >60)
GLUCOSE: 136 mg/dL — AB (ref 65–99)
Potassium: 3.2 mmol/L — ABNORMAL LOW (ref 3.5–5.1)
Sodium: 145 mmol/L (ref 135–145)

## 2015-08-06 LAB — GLUCOSE, CAPILLARY
GLUCOSE-CAPILLARY: 117 mg/dL — AB (ref 65–99)
GLUCOSE-CAPILLARY: 102 mg/dL — AB (ref 65–99)
GLUCOSE-CAPILLARY: 126 mg/dL — AB (ref 65–99)
GLUCOSE-CAPILLARY: 113 mg/dL — AB (ref 65–99)
Glucose-Capillary: 124 mg/dL — ABNORMAL HIGH (ref 65–99)

## 2015-08-06 LAB — CBC
HEMATOCRIT: 25.1 % — AB (ref 39.0–52.0)
HEMOGLOBIN: 7.8 g/dL — AB (ref 13.0–17.0)
MCH: 30 pg (ref 26.0–34.0)
MCHC: 31.1 g/dL (ref 30.0–36.0)
MCV: 96.5 fL (ref 78.0–100.0)
Platelets: 216 10*3/uL (ref 150–400)
RBC: 2.6 MIL/uL — ABNORMAL LOW (ref 4.22–5.81)
RDW: 17.4 % — AB (ref 11.5–15.5)
WBC: 8.7 10*3/uL (ref 4.0–10.5)

## 2015-08-06 MED ORDER — ENOXAPARIN SODIUM 30 MG/0.3ML ~~LOC~~ SOLN
30.0000 mg | Freq: Two times a day (BID) | SUBCUTANEOUS | Status: DC
  Administered 2015-08-06 – 2015-09-20 (×89): 30 mg via SUBCUTANEOUS
  Filled 2015-08-06 (×90): qty 0.3

## 2015-08-06 MED ORDER — CLONAZEPAM 1 MG PO TABS: 1.0000 mg | ORAL_TABLET | Freq: Three times a day (TID) | ORAL | Status: DC

## 2015-08-06 MED ORDER — CLONAZEPAM 0.1 MG/ML ORAL SUSPENSION
1.0000 mg | Freq: Three times a day (TID) | ORAL | Status: DC
  Filled 2015-08-06: qty 10

## 2015-08-06 MED ORDER — DIAZEPAM 1 MG/ML PO SOLN
5.0000 mg | Freq: Three times a day (TID) | ORAL | Status: DC
  Administered 2015-08-06 – 2015-08-09 (×9): 5 mg
  Filled 2015-08-06 (×9): qty 5

## 2015-08-06 MED ORDER — QUETIAPINE FUMARATE 100 MG PO TABS
100.0000 mg | ORAL_TABLET | Freq: Three times a day (TID) | ORAL | Status: DC
  Administered 2015-08-06 – 2015-08-11 (×17): 100 mg
  Filled 2015-08-06 (×17): qty 1

## 2015-08-06 MED ORDER — ALBUMIN HUMAN 5 % IV SOLN
25.0000 g | Freq: Once | INTRAVENOUS | Status: AC
  Administered 2015-08-06: 25 g via INTRAVENOUS
  Filled 2015-08-06: qty 250

## 2015-08-06 MED ORDER — POTASSIUM CHLORIDE 10 MEQ/50ML IV SOLN
10.0000 meq | INTRAVENOUS | Status: AC
  Administered 2015-08-06 (×3): 10 meq via INTRAVENOUS
  Filled 2015-08-06 (×3): qty 50

## 2015-08-06 NOTE — Progress Notes (Signed)
Remained sedated on ventilator. No significant change in status. No new recommendations.

## 2015-08-06 NOTE — Progress Notes (Signed)
Notified MD regarding patients temperature being 102.64F.  No orders received at this time.  Will continue to monitor patient.
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
## 2015-08-07 ENCOUNTER — Inpatient Hospital Stay (HOSPITAL_COMMUNITY): Payer: BLUE CROSS/BLUE SHIELD

## 2015-08-07 LAB — CBC
HCT: 23.2 % — ABNORMAL LOW (ref 39.0–52.0)
Hemoglobin: 7.5 g/dL — ABNORMAL LOW (ref 13.0–17.0)
MCH: 31.5 pg (ref 26.0–34.0)
MCHC: 32.3 g/dL (ref 30.0–36.0)
MCV: 97.5 fL (ref 78.0–100.0)
PLATELETS: 243 10*3/uL (ref 150–400)
RBC: 2.38 MIL/uL — AB (ref 4.22–5.81)
RDW: 17 % — ABNORMAL HIGH (ref 11.5–15.5)
WBC: 8.5 10*3/uL (ref 4.0–10.5)

## 2015-08-07 LAB — BLOOD GAS, ARTERIAL
Acid-Base Excess: 0.8 mmol/L (ref 0.0–2.0)
Acid-Base Excess: 6.6 mmol/L — ABNORMAL HIGH (ref 0.0–2.0)
Bicarbonate: 25.2 mEq/L — ABNORMAL HIGH (ref 20.0–24.0)
Bicarbonate: 30.2 mEq/L — ABNORMAL HIGH (ref 20.0–24.0)
DRAWN BY: 406621
Drawn by: 41977
FIO2: 0.5
FIO2: 0.4
MECHVT: 620 mL
O2 Saturation: 98.8 %
O2 Saturation: 97.9 %
PEEP/CPAP: 5 cmH2O
PEEP: 5 cmH2O
PH ART: 7.494 — AB (ref 7.350–7.450)
Patient temperature: 98.6
Patient temperature: 98.6
RATE: 18 resp/min
RATE: 18 resp/min
TCO2: 26.5 mmol/L (ref 0–100)
TCO2: 31.4 mmol/L (ref 0–100)
VT: 620 mL
pCO2 arterial: 42.7 mmHg (ref 35.0–45.0)
pCO2 arterial: 39.6 mmHg (ref 35.0–45.0)
pH, Arterial: 7.389 (ref 7.350–7.450)
pO2, Arterial: 123 mmHg — ABNORMAL HIGH (ref 80.0–100.0)
pO2, Arterial: 92.3 mmHg (ref 80.0–100.0)

## 2015-08-07 LAB — BASIC METABOLIC PANEL
ANION GAP: 10 (ref 5–15)
BUN: 24 mg/dL — AB (ref 6–20)
CALCIUM: 8 mg/dL — AB (ref 8.9–10.3)
CO2: 29 mmol/L (ref 22–32)
Chloride: 107 mmol/L (ref 101–111)
Creatinine, Ser: 0.77 mg/dL (ref 0.61–1.24)
GFR calc Af Amer: >60 mL/min (ref >60)
Glucose, Bld: 122 mg/dL — ABNORMAL HIGH (ref 65–99)
POTASSIUM: 3.4 mmol/L — AB (ref 3.5–5.1)
Sodium: 146 mmol/L — ABNORMAL HIGH (ref 135–145)

## 2015-08-07 LAB — HEPATIC FUNCTION PANEL
ALBUMIN: 1.7 g/dL — AB (ref 3.5–5.0)
ALK PHOS: 45 U/L (ref 38–126)
ALT: 61 U/L (ref 17–63)
AST: 52 U/L — AB (ref 15–41)
Bilirubin, Direct: 0.6 mg/dL — ABNORMAL HIGH (ref 0.1–0.5)
Indirect Bilirubin: 1.4 mg/dL — ABNORMAL HIGH (ref 0.3–0.9)
TOTAL PROTEIN: 4.7 g/dL — AB (ref 6.5–8.1)
Total Bilirubin: 2 mg/dL — ABNORMAL HIGH (ref 0.3–1.2)

## 2015-08-07 LAB — CULTURE, BLOOD (ROUTINE X 2)
CULTURE: NO GROWTH
Culture: NO GROWTH

## 2015-08-07 LAB — GLUCOSE, CAPILLARY
GLUCOSE-CAPILLARY: 116 mg/dL — AB (ref 65–99)
GLUCOSE-CAPILLARY: 116 mg/dL — AB (ref 65–99)
GLUCOSE-CAPILLARY: 142 mg/dL — AB (ref 65–99)
Glucose-Capillary: 136 mg/dL — ABNORMAL HIGH (ref 65–99)
Glucose-Capillary: 128 mg/dL — ABNORMAL HIGH (ref 65–99)
Glucose-Capillary: 125 mg/dL — ABNORMAL HIGH (ref 65–99)
Glucose-Capillary: 134 mg/dL — ABNORMAL HIGH (ref 65–99)

## 2015-08-07 MED ORDER — PIVOT 1.5 CAL PO LIQD
1000.0000 mL | ORAL | Status: DC
  Administered 2015-08-07 – 2015-08-28 (×25): 1000 mL
  Filled 2015-08-07 (×9): qty 1000
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
## 2015-08-07 NOTE — Progress Notes (Signed)
Arterial pressure line setup changed @ first connection out from L Femoral insertion site, RN aware @ bedside, no complications, flushes/drawls/Zeroed, currently site @ 8 days.

## 2015-08-07 NOTE — Progress Notes (Signed)
Pharmacy Antibiotic Note  Julien NordmannDonald Wayne Dovel is a 50 y.o. male admitted on 07/30/2015 with pneumonia.  Pharmacy has been consulted for cefepime dosing. Tmax is 102.7 and WBC is WNL. Renal function is stable and dose remains appropriate.   Plan: - Continue cefepime 2gm IV Q8H - F/u renal fxn, C&S, clinical status and trough at SS  Height: 5\' 11"  (180.3 cm) Weight: 245 lb 2.4 oz (111.2 kg) IBW/kg (Calculated) : 75.3  Temp (24hrs), Avg:101.5 F (38.6 C), Min:98.8 F (37.1 C), Max:102.7 F (39.3 C)   Recent Labs Lab 08/03/15 0353 08/04/15 0510 08/04/15 1150 08/05/15 0400 08/06/15 0400 08/07/15 0415  WBC 8.6 8.1  --  8.8 8.7 8.5  CREATININE 0.60* 0.66  --  0.70 0.65 0.77  LATICACIDVEN  --   --  1.8  --   --   --     Estimated Creatinine Clearance: 141.7 mL/min (by C-G formula based on Cr of 0.77).    No Known Allergies  Antimicrobials this admission: Ancef 4/10>>4/14 Vanc 4/14>>4/16 Cefepime 4/14>>  Dose adjustments this admission: N/a  Microbiology results: 4/12 BCx: ngtd 4/12 urine: neg 4/12 TA: h. Flu beta-lactamase (NOT esbl) 4/9 MRSA - NEG  Thank you for allowing pharmacy to be a part of this patient's care.  Lysle Pearlachel Jaylie Neaves, PharmD, BCPS Pager # [phone] 08/07/2015 7:56 AM
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
## 2015-08-07 NOTE — Progress Notes (Signed)
Wasted 200mL of fentanyl with Berniece Paponnie Dupont, RN

## 2015-08-07 NOTE — Progress Notes (Signed)
Follow up - Trauma and Critical Care  Patient Details:    [name] is an 50 y.o. male.  Lines/tubes : Airway 7.5 mm (Active)  Secured at (cm) 26 cm 08/07/2015  3:40 AM  Measured From Lips 08/07/2015  3:40 AM  Secured Location Center 08/07/2015  3:40 AM  Secured By [employer] 08/07/2015  3:40 AM  Tube Holder Repositioned Yes 08/07/2015  3:40 AM  Cuff Pressure (cm H2O) 26 cm H2O 08/07/2015 12:15 AM  Site Condition Dry 08/07/2015  3:40 AM     PICC Triple Lumen 07/30/15 PICC Right Basilic 40 cm 0 cm (Active)  Indication for Insertion or Continuance of Line Vasoactive infusions 08/07/2015  7:50 AM  Exposed Catheter (cm) 0 cm 08/05/2015  8:00 AM  Site Assessment Clean;Dry;Intact 08/06/2015  8:00 PM  Lumen #1 Status Infusing 08/06/2015  8:00 PM  Lumen #2 Status Infusing 08/06/2015  8:00 PM  Lumen #3 Status Other (Comment) 08/06/2015  8:00 PM  Dressing Type Transparent;Occlusive 08/06/2015  8:00 PM  Dressing Status Clean;Dry;Intact;Antimicrobial disc in place 08/06/2015  8:00 PM  Line Care Cap(s) changed;Connections checked and tightened;Tubing changed 08/06/2015  7:00 PM  Line Adjustment (NICU/IV Team Only) No 07/30/2015 11:25 AM  Dressing Intervention Dressing changed;Antimicrobial disc changed 08/06/2015  7:00 PM  Dressing Change Due 08/13/15 08/06/2015  7:00 PM     Arterial Line 07/30/15 Left Femoral (Active)  Site Assessment Clean;Dry;Intact 08/06/2015  8:00 PM  Line Status Pulsatile blood flow 08/06/2015  8:00 PM  Art Line Waveform Appropriate;Square wave test performed 08/06/2015  8:00 PM  Art Line Interventions Leveled;Tubing changed;Connections checked and tightened 08/06/2015  8:00 PM  Color/Movement/Sensation Capillary refill less than 3 sec 08/06/2015  8:00 PM  Dressing Type Transparent;Occlusive 08/06/2015  8:00 PM  Dressing Status Old drainage;Antimicrobial disc in place;Dry 08/06/2015  8:00 PM  Interventions Dressing changed;Antimicrobial disc changed;Dressing reinforced  08/05/2015  6:00 PM  Dressing Change Due 08/12/15 08/06/2015  8:00 AM     NG/OG Tube Orogastric 16 Fr. Center mouth (Active)  Placement Verification Auscultation 08/06/2015  8:00 PM  Site Assessment Clean;Dry;Intact 08/06/2015  8:00 PM  Status Infusing tube feed 08/06/2015  8:00 PM  Amount of suction 115 mmHg 08/06/2015  8:00 PM  Drainage Appearance Brown 08/06/2015  8:00 PM  Intake (mL) 240 mL 08/05/2015  3:00 PM     Urethral Catheter Brown rn  Temperature probe 16 Fr. (Active)  Indication for Insertion or Continuance of Catheter Bladder outlet obstruction / other urologic reason 08/07/2015  7:49 AM  Site Assessment Clean;Intact 08/06/2015  8:00 PM  Catheter Maintenance No dependent loops;Bag below level of bladder;Seal intact;Catheter secured;Bag emptied prior to transport;Drainage bag/tubing not touching floor;Insertion date on drainage bag 08/07/2015  7:49 AM  Collection Container Standard drainage bag 08/06/2015  8:00 PM  Securement Method Securing device (Describe) 08/06/2015  8:00 PM  Urinary Catheter Interventions Unclamped 08/06/2015  8:00 PM  Output (mL) 100 mL 08/07/2015  6:00 AM    Microbiology/Sepsis markers: Results for orders placed or performed during the hospital encounter of 07/30/15  MRSA PCR Screening     Status: None   Collection Time: 07/30/15  1:48 PM  Result Value Ref Range Status   MRSA by PCR NEGATIVE NEGATIVE Final    Comment:        The GeneXpert MRSA Assay (FDA approved for NASAL specimens only), is one component of a comprehensive MRSA colonization surveillance program. It is not intended to diagnose MRSA infection nor to guide or monitor  treatment for MRSA infections.   Culture, Urine     Status: None   Collection Time: 08/02/15 11:00 AM  Result Value Ref Range Status   Specimen Description URINE, CATHETERIZED  Final   Special Requests NONE  Final   Culture NO GROWTH 1 DAY  Final   Report Status 08/03/2015 FINAL  Final  Culture, respiratory  (NON-Expectorated)     Status: None   Collection Time: 08/02/15 11:47 AM  Result Value Ref Range Status   Specimen Description TRACHEAL ASPIRATE  Final   Special Requests NONE  Final   Gram Stain   Final    ABUNDANT WBC PRESENT, PREDOMINANTLY PMN NO SQUAMOUS EPITHELIAL CELLS SEEN ABUNDANT GRAM NEGATIVE COCCOBACILLI Performed at [facility]    Culture   Final    ABUNDANT HAEMOPHILUS INFLUENZAE Note: BETA LACTAMASE POSITIVE Performed at [facility]    Report Status 08/04/2015 FINAL  Final  Culture, blood (Routine X 2) w Reflex to ID Panel     Status: None (Preliminary result)   Collection Time: 08/02/15 12:20 PM  Result Value Ref Range Status   Specimen Description BLOOD LEFT ANTECUBITAL  Final   Special Requests   Final    BOTTLES DRAWN AEROBIC AND ANAEROBIC 7CC AER 3CC ANA   Culture NO GROWTH 4 DAYS  Final   Report Status PENDING  Incomplete  Culture, blood (Routine X 2) w Reflex to ID Panel     Status: None (Preliminary result)   Collection Time: 08/02/15 12:35 PM  Result Value Ref Range Status   Specimen Description BLOOD BLOOD LEFT WRIST  Final   Special Requests BOTTLES DRAWN AEROBIC ONLY 5CC  Final   Culture NO GROWTH 4 DAYS  Final   Report Status PENDING  Incomplete    Anti-infectives:  Anti-infectives    Start     Dose/Rate Route Frequency Ordered Stop   08/04/15 1800  vancomycin (VANCOCIN) IVPB 1000 mg/200 mL premix  Status:  Discontinued     1,000 mg 200 mL/hr over 60 Minutes Intravenous Every 8 hours 08/04/15 0906 08/06/15 0817   08/04/15 1000  ceFEPIme (MAXIPIME) 2 g in dextrose 5 % 50 mL IVPB     2 g 100 mL/hr over 30 Minutes Intravenous Every 8 hours 08/04/15 0906     08/04/15 1000  vancomycin (VANCOCIN) 2,000 mg in sodium chloride 0.9 % 500 mL IVPB     2,000 mg 250 mL/hr over 120 Minutes Intravenous  Once 08/04/15 0906 08/04/15 1446   07/31/15 1930  ceFAZolin (ANCEF) IVPB 2g/100 mL premix  Status:  Discontinued     2 g 200 mL/hr over 30  Minutes Intravenous 3 times per day 07/31/15 1923 08/04/15 0858   07/31/15 1715  ceFAZolin (ANCEF) 2-4 GM/100ML-% IVPB  Status:  Discontinued    Comments:  Key, Jennifer   : cabinet override      07/31/15 1715 07/31/15 1923   07/31/15 1436  vancomycin (VANCOCIN) powder  Status:  Discontinued       As needed 07/31/15 1436 07/31/15 1844   07/31/15 1300  bacitracin 50,000 Units in sodium chloride irrigation 0.9 % 500 mL irrigation  Status:  Discontinued       As needed 07/31/15 1353 07/31/15 1844   07/31/15 1240  vancomycin (VANCOCIN) 1000 MG powder  Status:  Discontinued    Comments:  Ratcliff, Esther   : cabinet override      07/31/15 1240 07/31/15 1248   07/31/15 1231  vancomycin (VANCOCIN) 1000 MG powder  Comments:  Dorinda HillKey, Jennifer   : cabinet override      07/31/15 1231 08/01/15 0044      Best Practice/Protocols:  VTE Prophylaxis: Lovenox (prophylaxtic dose) and Mechanical GI Prophylaxis: Proton Pump Inhibitor Currently off IV sedation.  Consults: Treatment Team:  Newman PiesSu Teoh, MD Donalee CitrinGary Cram, MD Durene RomansMatthew Olin, MD    Events:  Subjective:    Overnight Issues: No particular issues overnight.  Objective:  Vital signs for last 24 hours: Temp:  [98.8 F (37.1 C)-102.7 F (39.3 C)] 100.8 F (38.2 C) (04/17 0700) Pulse Rate:  [94-115] 104 (04/17 0700) Resp:  [22-31] 26 (04/17 0700) BP: (93-136)/(48-119) 113/63 mmHg (04/17 0700) SpO2:  [95 %-100 %] 95 % (04/17 0700) Arterial Line BP: (87-163)/(38-74) 122/57 mmHg (04/17 0600) FiO2 (%):  [40 %] 40 % (04/17 0340) Weight:  [111.2 kg (245 lb 2.4 oz)] 111.2 kg (245 lb 2.4 oz) (04/17 0416)  Hemodynamic parameters for last 24 hours: CVP:  [5 mmHg-16 mmHg] 12 mmHg  Intake/Output from previous day: 04/16 0701 - 04/17 0700 In: 3817.2 [I.V.:1787.2; NG/GT:1180; IV Piggyback:850] Out: 3995 [Urine:3995]  Intake/Output this shift:    Vent settings for last 24 hours: Vent Mode:  [-] PRVC FiO2 (%):  [40 %] 40 % Set Rate:  [18 bmp] 18  bmp Vt Set:  [620 mL] 620 mL PEEP:  [5 cmH20] 5 cmH20 Plateau Pressure:  [19 cmH20-22 cmH20] 19 cmH20  Physical Exam:  General: no respiratory distress Neuro: nonfocal exam, RASS -1, RASS -2, weakness right lower extremity, weakness left lower extremity and paraplegic HEENT/Neck: ETT WNL  and ETT CVS: Sinus tachycardia, possible related to fever. GI: soft, nontender, BS WNL, no r/g and tolerating tube feedings. Extremities: edema 3+ and pulses doppler,   Results for orders placed or performed during the hospital encounter of 07/30/15 (from the past 24 hour(s))  Glucose, capillary     Status: Abnormal   Collection Time: 08/06/15 11:33 AM  Result Value Ref Range   Glucose-Capillary 124 (H) 65 - 99 mg/dL  Glucose, capillary     Status: Abnormal   Collection Time: 08/06/15  3:33 PM  Result Value Ref Range   Glucose-Capillary 126 (H) 65 - 99 mg/dL  Glucose, capillary     Status: Abnormal   Collection Time: 08/06/15  8:03 PM  Result Value Ref Range   Glucose-Capillary 113 (H) 65 - 99 mg/dL  Glucose, capillary     Status: Abnormal   Collection Time: 08/06/15 11:49 PM  Result Value Ref Range   Glucose-Capillary 116 (H) 65 - 99 mg/dL  Glucose, capillary     Status: Abnormal   Collection Time: 08/07/15  3:49 AM  Result Value Ref Range   Glucose-Capillary 136 (H) 65 - 99 mg/dL  CBC     Status: Abnormal   Collection Time: 08/07/15  4:15 AM  Result Value Ref Range   WBC 8.5 4.0 - 10.5 K/uL   RBC 2.38 (L) 4.22 - 5.81 MIL/uL   Hemoglobin 7.5 (L) 13.0 - 17.0 g/dL   HCT 78.223.2 (L) 95.639.0 - 21.352.0 %   MCV 97.5 78.0 - 100.0 fL   MCH 31.5 26.0 - 34.0 pg   MCHC 32.3 30.0 - 36.0 g/dL   RDW 08.617.0 (H) 57.811.5 - 46.915.5 %   Platelets 243 150 - 400 K/uL  Basic metabolic panel     Status: Abnormal   Collection Time: 08/07/15  4:15 AM  Result Value Ref Range   Sodium 146 (H) 135 - 145 mmol/L  Potassium 3.4 (L) 3.5 - 5.1 mmol/L   Chloride 107 101 - 111 mmol/L   CO2 29 22 - 32 mmol/L   Glucose, Bld 122  (H) 65 - 99 mg/dL   BUN 24 (H) 6 - 20 mg/dL   Creatinine, Ser 1.61 0.61 - 1.24 mg/dL   Calcium 8.0 (L) 8.9 - 10.3 mg/dL   GFR calc non Af Amer >60 >60 mL/min   GFR calc Af Amer >60 >60 mL/min   Anion gap 10 5 - 15     Assessment/Plan:   NEURO  Altered Mental Status:  sedation   Plan: Getting valium and seroquel, very sleepy now.  PULM  Atelectasis/collapse (diffuse ) Pneumonia: aspiration pneumonitis and hospital acquired (not ventilator-associated) Haemophilus Acute Respiratory Failure (due to atelectasis and due to pulmonary infiltrates) and Failure to Wean (due to limited pulmonary reserve)   Plan: Will attempt to start weaning, but probably will need tracheostomy in the near future.  CARDIO  Sinus Tachycardia   Plan: No specific treatment.  RENAL  Urine output is good.   Plan: CPM  GI  No issues   Plan: Tolerating tube feedings.  ID  Pneumonia (hospital acquired (not ventilator-associated) Haemophilus)   Plan: Getting proper treatment.  HEME  Anemia acute blood loss anemia and anemia of critical illness)   Plan: No blood for now.  ENDO No specific issues.   Plan: CPM  Global Issues  Patient sedated and not weaning.  If we cannot get him to the point where we can consider extubation, we should probably just plan on tracheostomy.  Will Check ABG.  He is on Lasix.  Looks more jaundiced.    LOS: 8 days   Additional comments:I reviewed the patient's new clinical lab test results. cbc/bmet and I reviewed the patients new imaging test results. cxr  Critical Care Total Time*: 30 Minutes  Luellen Howson 08/07/2015  *Care during the described time interval was provided by me and/or other providers on the critical care team.  I have reviewed this patient's available data, including medical history, events of note, physical examination and test results as part of my evaluation.

## 2015-08-07 NOTE — Progress Notes (Signed)
PT/OT  Are recommending IP Rehab based on session from 08/04/15.  Pt. Remains on vent per medical record.  Please consider ordering IP Rehab consult when medically appropriate.    Weldon PickingSusan Quasim Doyon PT Inpatient Rehab Admissions Coordinator Cell [phone] Office [phone]

## 2015-08-08 ENCOUNTER — Inpatient Hospital Stay (HOSPITAL_COMMUNITY): Payer: BLUE CROSS/BLUE SHIELD

## 2015-08-08 LAB — GLUCOSE, CAPILLARY
GLUCOSE-CAPILLARY: 124 mg/dL — AB (ref 65–99)
Glucose-Capillary: 134 mg/dL — ABNORMAL HIGH (ref 65–99)
Glucose-Capillary: 129 mg/dL — ABNORMAL HIGH (ref 65–99)
Glucose-Capillary: 111 mg/dL — ABNORMAL HIGH (ref 65–99)
Glucose-Capillary: 118 mg/dL — ABNORMAL HIGH (ref 65–99)

## 2015-08-08 LAB — CBC WITH DIFFERENTIAL/PLATELET
BASOS ABS: 0.1 10*3/uL (ref 0.0–0.1)
Basophils Relative: 1 %
EOS ABS: 0.3 10*3/uL (ref 0.0–0.7)
Eosinophils Relative: 3 %
HCT: 23.9 % — ABNORMAL LOW (ref 39.0–52.0)
Hemoglobin: 7.3 g/dL — ABNORMAL LOW (ref 13.0–17.0)
LYMPHS ABS: 1.5 10*3/uL (ref 0.7–4.0)
Lymphocytes Relative: 15 %
MCH: 30.2 pg (ref 26.0–34.0)
MCHC: 30.5 g/dL (ref 30.0–36.0)
MCV: 98.8 fL (ref 78.0–100.0)
MONO ABS: 0.9 10*3/uL (ref 0.1–1.0)
MONOS PCT: 9 %
Neutro Abs: 7 10*3/uL (ref 1.7–7.7)
Neutrophils Relative %: 72 %
PLATELETS: 300 10*3/uL (ref 150–400)
RBC: 2.42 MIL/uL — AB (ref 4.22–5.81)
RDW: 16.5 % — AB (ref 11.5–15.5)
WBC: 9.8 10*3/uL (ref 4.0–10.5)

## 2015-08-08 LAB — BASIC METABOLIC PANEL
Anion gap: 10 (ref 5–15)
BUN: 25 mg/dL — ABNORMAL HIGH (ref 6–20)
CHLORIDE: 108 mmol/L (ref 101–111)
CO2: 29 mmol/L (ref 22–32)
Calcium: 8 mg/dL — ABNORMAL LOW (ref 8.9–10.3)
Creatinine, Ser: 0.75 mg/dL (ref 0.61–1.24)
Glucose, Bld: 136 mg/dL — ABNORMAL HIGH (ref 65–99)
Potassium: 3.9 mmol/L (ref 3.5–5.1)
SODIUM: 147 mmol/L — AB (ref 135–145)

## 2015-08-08 NOTE — Progress Notes (Signed)
Follow up - Trauma and Critical Care  Patient Details:    [name] is an 50 y.o. male.  Lines/tubes : Airway 7.5 mm (Active)  Secured at (cm) 24 cm 08/08/2015  7:14 AM  Measured From Lips 08/08/2015  7:14 AM  Secured Location Right 08/08/2015  7:14 AM  Secured By [employer] 08/08/2015  7:14 AM  Tube Holder Repositioned Yes 08/08/2015  7:14 AM  Cuff Pressure (cm H2O) 26 cm H2O 08/07/2015  8:00 PM  Site Condition Dry 08/08/2015  7:14 AM     PICC Triple Lumen 07/30/15 PICC Right Basilic 40 cm 0 cm (Active)  Indication for Insertion or Continuance of Line Prolonged intravenous therapies 08/07/2015  8:00 PM  Exposed Catheter (cm) 0 cm 08/05/2015  8:00 AM  Site Assessment Clean;Dry;Intact 08/07/2015  8:00 PM  Lumen #1 Status Infusing 08/07/2015  8:00 PM  Lumen #2 Status Infusing 08/07/2015  8:00 PM  Lumen #3 Status Saline locked 08/07/2015  8:00 PM  Dressing Type Transparent;Occlusive 08/07/2015  8:00 PM  Dressing Status Clean;Dry;Intact;Antimicrobial disc in place 08/07/2015  8:00 PM  Line Care Cap(s) changed;Connections checked and tightened;Tubing changed 08/06/2015  7:00 PM  Line Adjustment (NICU/IV Team Only) No 07/30/2015 11:25 AM  Dressing Intervention Dressing changed;Antimicrobial disc changed 08/06/2015  7:00 PM  Dressing Change Due 08/13/15 08/06/2015  7:00 PM     Arterial Line 07/30/15 Left Femoral (Active)  Site Assessment Clean;Dry;Intact 08/07/2015  8:00 PM  Line Status Pulsatile blood flow 08/07/2015  8:00 PM  Art Line Waveform Appropriate;Square wave test performed 08/07/2015  8:00 PM  Art Line Interventions Zeroed and calibrated 08/07/2015  8:00 PM  Color/Movement/Sensation Capillary refill less than 3 sec 08/07/2015  8:00 PM  Dressing Type Transparent;Occlusive 08/07/2015  8:00 PM  Dressing Status Old drainage;Antimicrobial disc in place;Dry 08/07/2015  8:00 PM  Interventions Dressing changed;Antimicrobial disc changed;Dressing reinforced 08/05/2015  6:00 PM   Dressing Change Due 08/12/15 08/06/2015  8:00 AM     NG/OG Tube Orogastric 16 Fr. Center mouth (Active)  Placement Verification Auscultation 08/07/2015  8:00 PM  Site Assessment Clean;Dry;Intact 08/07/2015  8:00 PM  Status Infusing tube feed 08/07/2015  8:00 PM  Amount of suction 115 mmHg 08/06/2015  8:00 PM  Drainage Appearance Brown 08/06/2015  8:00 PM  Intake (mL) 240 mL 08/05/2015  3:00 PM     Urethral Catheter Brown rn  Temperature probe 16 Fr. (Active)  Indication for Insertion or Continuance of Catheter Bladder outlet obstruction / other urologic reason 08/07/2015  8:00 PM  Site Assessment Clean;Intact;Dry 08/07/2015  8:00 PM  Catheter Maintenance Bag below level of bladder;Catheter secured;Drainage bag/tubing not touching floor 08/07/2015  8:00 PM  Collection Container Standard drainage bag 08/07/2015  8:00 PM  Securement Method Securing device (Describe) 08/07/2015  8:00 PM  Urinary Catheter Interventions Unclamped 08/07/2015  8:00 AM  Output (mL) 650 mL 08/08/2015  6:00 AM    Microbiology/Sepsis markers: Results for orders placed or performed during the hospital encounter of 07/30/15  MRSA PCR Screening     Status: None   Collection Time: 07/30/15  1:48 PM  Result Value Ref Range Status   MRSA by PCR NEGATIVE NEGATIVE Final    Comment:        The GeneXpert MRSA Assay (FDA approved for NASAL specimens only), is one component of a comprehensive MRSA colonization surveillance program. It is not intended to diagnose MRSA infection nor to guide or monitor treatment for MRSA infections.   Culture, Urine  Status: None   Collection Time: 08/02/15 11:00 AM  Result Value Ref Range Status   Specimen Description URINE, CATHETERIZED  Final   Special Requests NONE  Final   Culture NO GROWTH 1 DAY  Final   Report Status 08/03/2015 FINAL  Final  Culture, respiratory (NON-Expectorated)     Status: None   Collection Time: 08/02/15 11:47 AM  Result Value Ref Range Status   Specimen  Description TRACHEAL ASPIRATE  Final   Special Requests NONE  Final   Gram Stain   Final    ABUNDANT WBC PRESENT, PREDOMINANTLY PMN NO SQUAMOUS EPITHELIAL CELLS SEEN ABUNDANT GRAM NEGATIVE COCCOBACILLI Performed at [facility]    Culture   Final    ABUNDANT HAEMOPHILUS INFLUENZAE Note: BETA LACTAMASE POSITIVE Performed at [facility]    Report Status 08/04/2015 FINAL  Final  Culture, blood (Routine X 2) w Reflex to ID Panel     Status: None   Collection Time: 08/02/15 12:20 PM  Result Value Ref Range Status   Specimen Description BLOOD LEFT ANTECUBITAL  Final   Special Requests   Final    BOTTLES DRAWN AEROBIC AND ANAEROBIC 7CC AER 3CC ANA   Culture NO GROWTH 5 DAYS  Final   Report Status 08/07/2015 FINAL  Final  Culture, blood (Routine X 2) w Reflex to ID Panel     Status: None   Collection Time: 08/02/15 12:35 PM  Result Value Ref Range Status   Specimen Description BLOOD BLOOD LEFT WRIST  Final   Special Requests BOTTLES DRAWN AEROBIC ONLY 5CC  Final   Culture NO GROWTH 5 DAYS  Final   Report Status 08/07/2015 FINAL  Final    Anti-infectives:  Anti-infectives    Start     Dose/Rate Route Frequency Ordered Stop   08/04/15 1800  vancomycin (VANCOCIN) IVPB 1000 mg/200 mL premix  Status:  Discontinued     1,000 mg 200 mL/hr over 60 Minutes Intravenous Every 8 hours 08/04/15 0906 08/06/15 0817   08/04/15 1000  ceFEPIme (MAXIPIME) 2 g in dextrose 5 % 50 mL IVPB     2 g 100 mL/hr over 30 Minutes Intravenous Every 8 hours 08/04/15 0906     08/04/15 1000  vancomycin (VANCOCIN) 2,000 mg in sodium chloride 0.9 % 500 mL IVPB     2,000 mg 250 mL/hr over 120 Minutes Intravenous  Once 08/04/15 0906 08/04/15 1446   07/31/15 1930  ceFAZolin (ANCEF) IVPB 2g/100 mL premix  Status:  Discontinued     2 g 200 mL/hr over 30 Minutes Intravenous 3 times per day 07/31/15 1923 08/04/15 0858   07/31/15 1715  ceFAZolin (ANCEF) 2-4 GM/100ML-% IVPB  Status:  Discontinued     Comments:  Key, Jennifer   : cabinet override      07/31/15 1715 07/31/15 1923   07/31/15 1436  vancomycin (VANCOCIN) powder  Status:  Discontinued       As needed 07/31/15 1436 07/31/15 1844   07/31/15 1300  bacitracin 50,000 Units in sodium chloride irrigation 0.9 % 500 mL irrigation  Status:  Discontinued       As needed 07/31/15 1353 07/31/15 1844   07/31/15 1240  vancomycin (VANCOCIN) 1000 MG powder  Status:  Discontinued    Comments:  Ratcliff, Esther   : cabinet override      07/31/15 1240 07/31/15 1248   07/31/15 1231  vancomycin (VANCOCIN) 1000 MG powder    Comments:  Dorinda Hill   : cabinet override  07/31/15 1231 08/01/15 0044      Best Practice/Protocols:  VTE Prophylaxis: Lovenox (prophylaxtic dose) and Mechanical GI Prophylaxis: Proton Pump Inhibitor Intermittent Sedation Continous Sedation  Consults: Treatment Team:  Newman Pies, MD Donalee Citrin, MD Durene Romans, MD    Events:  Subjective:    Overnight Issues: Patient currently off sedation.  Will f9ollow some commands.  On wean mode on the ventilator.  RR about 30-34.  Sats are okay.  Objective:  Vital signs for last 24 hours: Temp:  [100.8 F (38.2 C)-102.6 F (39.2 C)] 101.1 F (38.4 C) (04/18 0800) Pulse Rate:  [90-133] 115 (04/18 0800) Resp:  [18-34] 34 (04/18 0800) BP: (90-156)/(49-99) 130/77 mmHg (04/18 0800) SpO2:  [93 %-99 %] 96 % (04/18 0800) Arterial Line BP: (90-165)/(40-76) 139/60 mmHg (04/18 0800) FiO2 (%):  [40 %] 40 % (04/18 0714) Weight:  [111.8 kg (246 lb 7.6 oz)] 111.8 kg (246 lb 7.6 oz) (04/18 0350)  Hemodynamic parameters for last 24 hours: CVP:  [10 mmHg-13 mmHg] 11 mmHg  Intake/Output from previous day: 04/17 0701 - 04/18 0700 In: 3284.4 [I.V.:1479.4; VO/ZD:6644; IV Piggyback:150] Out: 4351 [Urine:4351]  Intake/Output this shift:    Vent settings for last 24 hours: Vent Mode:  [-] PRVC FiO2 (%):  [40 %] 40 % Set Rate:  [18 bmp] 18 bmp Vt Set:  [034 mL] 620 mL PEEP:   [5 cmH20] 5 cmH20 Plateau Pressure:  [20 cmH20-31 cmH20] 21 cmH20  Physical Exam:  General: no respiratory distress Neuro: RASS 0, weakness right lower extremity and weakness left lower extremity Resp: rhonchi LUL CVS: regular rate and rhythm, S1, S2 normal, no murmur, click, rub or gallop GI: soft, nontender, BS WNL, no r/g and and tolerating tube feedings well Extremities: no edema, no erythema, pulses WNL and edema 2+  Results for orders placed or performed during the hospital encounter of 07/30/15 (from the past 24 hour(s))  Blood gas, arterial     Status: Abnormal   Collection Time: 08/07/15  8:40 AM  Result Value Ref Range   FIO2 0.40    Delivery systems VENTILATOR    VT 620 mL   LHR 18 resp/min   Peep/cpap 5.0 cm H20   pH, Arterial 7.494 (H) 7.350 - 7.450   pCO2 arterial 39.6 35.0 - 45.0 mmHg   pO2, Arterial 92.3 80.0 - 100.0 mmHg   Bicarbonate 30.2 (H) 20.0 - 24.0 mEq/L   TCO2 31.4 0 - 100 mmol/L   Acid-Base Excess 6.6 (H) 0.0 - 2.0 mmol/L   O2 Saturation 97.9 %   Patient temperature 98.6    Collection site A-LINE    Drawn by [id number]    Sample type ARTERIAL DRAW   Glucose, capillary     Status: Abnormal   Collection Time: 08/07/15 12:13 PM  Result Value Ref Range   Glucose-Capillary 125 (H) 65 - 99 mg/dL   Comment 1 Notify RN    Comment 2 Document in Chart   Glucose, capillary     Status: Abnormal   Collection Time: 08/07/15  3:49 PM  Result Value Ref Range   Glucose-Capillary 116 (H) 65 - 99 mg/dL   Comment 1 Notify RN    Comment 2 Document in Chart   Glucose, capillary     Status: Abnormal   Collection Time: 08/07/15  7:55 PM  Result Value Ref Range   Glucose-Capillary 142 (H) 65 - 99 mg/dL  Glucose, capillary     Status: Abnormal   Collection Time: 08/07/15 11:25 PM  Result Value Ref Range   Glucose-Capillary 134 (H) 65 - 99 mg/dL  Glucose, capillary     Status: Abnormal   Collection Time: 08/08/15  3:32 AM  Result Value Ref Range   Glucose-Capillary  124 (H) 65 - 99 mg/dL  CBC with Differential/Platelet     Status: Abnormal   Collection Time: 08/08/15  5:30 AM  Result Value Ref Range   WBC 9.8 4.0 - 10.5 K/uL   RBC 2.42 (L) 4.22 - 5.81 MIL/uL   Hemoglobin 7.3 (L) 13.0 - 17.0 g/dL   HCT 40.923.9 (L) 81.139.0 - 91.452.0 %   MCV 98.8 78.0 - 100.0 fL   MCH 30.2 26.0 - 34.0 pg   MCHC 30.5 30.0 - 36.0 g/dL   RDW 78.216.5 (H) 95.611.5 - 21.315.5 %   Platelets 300 150 - 400 K/uL   Neutrophils Relative % 72 %   Lymphocytes Relative 15 %   Monocytes Relative 9 %   Eosinophils Relative 3 %   Basophils Relative 1 %   Neutro Abs 7.0 1.7 - 7.7 K/uL   Lymphs Abs 1.5 0.7 - 4.0 K/uL   Monocytes Absolute 0.9 0.1 - 1.0 K/uL   Eosinophils Absolute 0.3 0.0 - 0.7 K/uL   Basophils Absolute 0.1 0.0 - 0.1 K/uL   RBC Morphology POLYCHROMASIA PRESENT    WBC Morphology MILD LEFT SHIFT (1-5% METAS, OCC MYELO, OCC BANDS)   Basic metabolic panel     Status: Abnormal   Collection Time: 08/08/15  5:30 AM  Result Value Ref Range   Sodium 147 (H) 135 - 145 mmol/L   Potassium 3.9 3.5 - 5.1 mmol/L   Chloride 108 101 - 111 mmol/L   CO2 29 22 - 32 mmol/L   Glucose, Bld 136 (H) 65 - 99 mg/dL   BUN 25 (H) 6 - 20 mg/dL   Creatinine, Ser 0.860.75 0.61 - 1.24 mg/dL   Calcium 8.0 (L) 8.9 - 10.3 mg/dL   GFR calc non Af Amer >60 >60 mL/min   GFR calc Af Amer >60 >60 mL/min   Anion gap 10 5 - 15     Assessment/Plan:   NEURO  Altered Mental Status:  agitation and sedation   Plan: Could possibly wean and extubate at this level  PULM  Atelectasis/collapse (diffuse infiltrates)   Plan: Continue lasix and KVO IVFs.  CARDIO  Sinus Tachycardia   Plan: No specific treatment  RENAL  Urine output is good.   Plan: Negative on fluids today.  This should help n the futures when trying to get off the ventilator.  GI  No issues   Plan: Continue tube feedings.  ID  Pneumonia (hospital acquired (not ventilator-associated) Being teated with appropriate antibiotics.)   Plan: CPM  HEME  Anemia  acute blood loss anemia and anemia of critical illness)   Plan: No blood for hemoglobin of 7.3  ENDO No specific issues   Plan: CPM  Global Issues  Patient doing okay.Weaning a bit, but on PS 15.  Struggling a bit.  Secretions are better.  CXR about the same.  If not able to wean, will need trach and PEG near the end of the week.    LOS: 9 days   Additional comments:I reviewed the patient's new clinical lab test results. cbc/bmet and I reviewed the patients new imaging test results. cxr  Critical Care Total Time*: 30 Minutes  Peony Barner 08/08/2015  *Care during the described time interval was provided by me and/or other providers on the  critical care team.  I have reviewed this patient's available data, including medical history, events of note, physical examination and test results as part of my evaluation.
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
## 2015-08-09 LAB — GLUCOSE, CAPILLARY
GLUCOSE-CAPILLARY: 109 mg/dL — AB (ref 65–99)
GLUCOSE-CAPILLARY: 118 mg/dL — AB (ref 65–99)
Glucose-Capillary: 121 mg/dL — ABNORMAL HIGH (ref 65–99)
Glucose-Capillary: 130 mg/dL — ABNORMAL HIGH (ref 65–99)
Glucose-Capillary: 136 mg/dL — ABNORMAL HIGH (ref 65–99)
Glucose-Capillary: 160 mg/dL — ABNORMAL HIGH (ref 65–99)

## 2015-08-09 MED ORDER — DIAZEPAM 1 MG/ML PO SOLN
10.0000 mg | Freq: Three times a day (TID) | ORAL | Status: DC
  Administered 2015-08-09 – 2015-08-11 (×7): 10 mg
  Filled 2015-08-09 (×7): qty 10
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
## 2015-08-09 NOTE — Progress Notes (Addendum)
The patient's right ear laceration site is noted to be necrotic, with breakdown of the laceration repairs.The area is extensively debrided.The right auricle is reattached with new Prolene sutures.

## 2015-08-09 NOTE — Progress Notes (Signed)
Orthopedic Tech Progress Note Patient Details:  Julien NordmannDonald Wayne Larose 1965-11-18 [id number]  Patient ID: Julien Nordmannonald Wayne Clingenpeel, male   DOB: 1965-11-18, 50 y.o.   MRN: [id number] Called in bio-tech brace order; spoke with Richardean Chimeraathy  Sherod Cisse 08/09/2015, 10:04 AM

## 2015-08-09 NOTE — Progress Notes (Signed)
Patient ID: [name], male   DOB: July 27, 1965, 50 y.o.   MRN: [id number] I spoke with his wife at the bedside William GelinasBurke Lakiesha Ralphs, MD, MPH, FACS Trauma: [phone] General Surgery: [phone]

## 2015-08-09 NOTE — Progress Notes (Signed)
Patient ID: William NordmannDonald Wayne Logan, male   DOB: 1966/01/25, 50 y.o.   MRN: [id number]  sedated  Intubated   No change in neurologic exam incision clean dry and intact

## 2015-08-09 NOTE — Progress Notes (Signed)
Orthopedic Tech Progress Note Patient Details:  William NordmannDonald Wayne Logan 07/06/1965 [id number]  Patient ID: William Nordmannonald Wayne Leer, male   DOB: 07/06/1965, 50 y.o.   MRN: [id number] Correction; ortho tech will supply the foot drop boots  Mervin Ramires 08/09/2015, 10:06 AM

## 2015-08-09 NOTE — Progress Notes (Signed)
Orthopedic Tech Progress Note Patient Details:  William NordmannDonald Wayne Logan Oct 24, 1965 [id number]  Ortho Devices Type of Ortho Device:  (prafo boots) Ortho Device/Splint Location: bilateral Ortho Device/Splint Interventions: Application   [name] 08/09/2015, 10:18 AM
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
## 2015-08-09 NOTE — Progress Notes (Signed)
Orthopedic Tech Progress Note Patient Details:  Julien NordmannDonald Wayne Verga [birth year] [id number]  Patient ID: Julien Nordmannonald Wayne Sciascia, male   DOB: [birth year], 50 y.o.   MRN: [id number]  bio-tech will not need to be contacted  Nikki DomCrawford, Jimi Giza 08/09/2015, 10:07 AM
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
## 2015-08-10 ENCOUNTER — Inpatient Hospital Stay (HOSPITAL_COMMUNITY): Payer: BLUE CROSS/BLUE SHIELD

## 2015-08-10 LAB — CBC WITH DIFFERENTIAL/PLATELET
BASOS ABS: 0 10*3/uL (ref 0.0–0.1)
Basophils Relative: 0 %
Eosinophils Absolute: 0.4 10*3/uL (ref 0.0–0.7)
Eosinophils Relative: 3 %
HCT: 26.6 % — ABNORMAL LOW (ref 39.0–52.0)
Hemoglobin: 7.9 g/dL — ABNORMAL LOW (ref 13.0–17.0)
LYMPHS ABS: 2.2 10*3/uL (ref 0.7–4.0)
Lymphocytes Relative: 18 %
MCH: 30 pg (ref 26.0–34.0)
MCHC: 29.7 g/dL — AB (ref 30.0–36.0)
MCV: 101.1 fL — ABNORMAL HIGH (ref 78.0–100.0)
MONO ABS: 0.8 10*3/uL (ref 0.1–1.0)
Monocytes Relative: 7 %
NEUTROS PCT: 72 %
Neutro Abs: 8.7 10*3/uL — ABNORMAL HIGH (ref 1.7–7.7)
PLATELETS: 360 10*3/uL (ref 150–400)
RBC: 2.63 MIL/uL — AB (ref 4.22–5.81)
RDW: 16 % — AB (ref 11.5–15.5)
WBC: 12.1 10*3/uL — AB (ref 4.0–10.5)

## 2015-08-10 LAB — BASIC METABOLIC PANEL
ANION GAP: 10 (ref 5–15)
BUN: 30 mg/dL — ABNORMAL HIGH (ref 6–20)
CALCIUM: 7.9 mg/dL — AB (ref 8.9–10.3)
CO2: 30 mmol/L (ref 22–32)
CREATININE: 0.76 mg/dL (ref 0.61–1.24)
Chloride: 107 mmol/L (ref 101–111)
GLUCOSE: 144 mg/dL — AB (ref 65–99)
Potassium: 4.2 mmol/L (ref 3.5–5.1)
Sodium: 147 mmol/L — ABNORMAL HIGH (ref 135–145)

## 2015-08-10 LAB — GLUCOSE, CAPILLARY
GLUCOSE-CAPILLARY: 109 mg/dL — AB (ref 65–99)
Glucose-Capillary: 140 mg/dL — ABNORMAL HIGH (ref 65–99)
Glucose-Capillary: 147 mg/dL — ABNORMAL HIGH (ref 65–99)
Glucose-Capillary: 109 mg/dL — ABNORMAL HIGH (ref 65–99)

## 2015-08-10 MED ORDER — FREE WATER
300.0000 mL | Freq: Four times a day (QID) | Status: DC
  Administered 2015-08-10 – 2015-08-20 (×31): 300 mL
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
## 2015-08-10 NOTE — Progress Notes (Signed)
RT changed pt ETT holder. No complications. Vital signs stable at this time. Patient tolerated well. RN and wife at bedside. RT will continue to monitor.

## 2015-08-10 NOTE — Progress Notes (Signed)
Patient ID: [name], male   DOB: 11-09-1965, 50 y.o.   MRN: [id number] No change neurologically sedated and intubated

## 2015-08-10 NOTE — Care Management Note (Signed)
Case Management Note  Patient Details  Name: [name] MRN: [id number] Date of Birth: 30-Jun-1965  Subjective/Objective:                    Action/Plan:   Expected Discharge Date:                  Expected Discharge Plan:  IP Rehab Facility  In-House Referral:     Discharge planning Services     Post Acute Care Choice:    Choice offered to:     DME Arranged:    DME Agency:     HH Arranged:    HH Agency:     Status of Service:  In process, will continue to follow  Medicare Important Message Given:    Date Medicare IM Given:    Medicare IM give by:    Date Additional Medicare IM Given:    Additional Medicare Important Message give by:     If discussed at Long Length of Stay Meetings, dates discussed:  08-10-15  UR updated   Additional Comments:  Kingsley PlanWile, Koryn Charlot Marie, RN 08/10/2015, 10:59 AM

## 2015-08-10 NOTE — Progress Notes (Signed)
Pharmacy Antibiotic Note  [name] is a 50 y.o. male admitted on 07/30/2015 with pneumonia.  Pharmacy has been consulted for cefepime dosing. Tmax is 102.6 and WBC is elevated at 12.1. Renal function is stable and dose remains appropriate. A stop date was added.   Plan: - Continue cefepime 2gm IV Q8H until 4/23 - F/u renal fxn, C&S, clinical status - Pharmacy will sign-off as no further dose adjustments anticipated. Please re-consult if needed. Thank you!  Height: 5\' 11"  (180.3 cm) Weight: 238 lb 5.1 oz (108.1 kg) IBW/kg (Calculated) : 75.3  Temp (24hrs), Avg:101.7 F (38.7 C), Min:100 F (37.8 C), Max:102.6 F (39.2 C)   Recent Labs Lab 08/04/15 1150 08/05/15 0400 08/06/15 0400 08/07/15 0415 08/08/15 0530 08/10/15 0530  WBC  --  8.8 8.7 8.5 9.8 12.1*  CREATININE  --  0.70 0.65 0.77 0.75 0.76  LATICACIDVEN 1.8  --   --   --   --   --     Estimated Creatinine Clearance: 139.7 mL/min (by C-G formula based on Cr of 0.76).    No Known Allergies  Antimicrobials this admission: Ancef 4/10>>4/14 Vanc 4/14>>4/16 Cefepime 4/14>>  Dose adjustments this admission: N/a  Microbiology results: 4/12 BCx: NEG 4/12 urine: neg 4/12 TA: h. Flu beta-lactamase (NOT esbl) 4/9 MRSA - NEG  Thank you for allowing pharmacy to be a part of this patient's care.  Lysle Pearlachel Jeshurun [place], PharmD, BCPS Pager # [phone] 08/10/2015 7:23 AM

## 2015-08-11 LAB — BASIC METABOLIC PANEL
Anion gap: 10 (ref 5–15)
BUN: 29 mg/dL — AB (ref 6–20)
CHLORIDE: 103 mmol/L (ref 101–111)
CO2: 30 mmol/L (ref 22–32)
Calcium: 8.1 mg/dL — ABNORMAL LOW (ref 8.9–10.3)
Creatinine, Ser: 0.71 mg/dL (ref 0.61–1.24)
GFR calc Af Amer: >60 mL/min (ref >60)
GFR calc non Af Amer: >60 mL/min (ref >60)
GLUCOSE: 143 mg/dL — AB (ref 65–99)
POTASSIUM: 3.8 mmol/L (ref 3.5–5.1)
Sodium: 143 mmol/L (ref 135–145)

## 2015-08-11 LAB — CBC
HEMATOCRIT: 26.9 % — AB (ref 39.0–52.0)
Hemoglobin: 8.1 g/dL — ABNORMAL LOW (ref 13.0–17.0)
MCH: 30.2 pg (ref 26.0–34.0)
MCHC: 30.1 g/dL (ref 30.0–36.0)
MCV: 100.4 fL — AB (ref 78.0–100.0)
Platelets: 364 10*3/uL (ref 150–400)
RBC: 2.68 MIL/uL — ABNORMAL LOW (ref 4.22–5.81)
RDW: 15.8 % — AB (ref 11.5–15.5)
WBC: 11.3 10*3/uL — AB (ref 4.0–10.5)

## 2015-08-11 LAB — URINE CULTURE
CULTURE: NO GROWTH
SPECIAL REQUESTS: NORMAL

## 2015-08-11 MED ORDER — DIAZEPAM 1 MG/ML PO SOLN
10.0000 mg | Freq: Two times a day (BID) | ORAL | Status: DC
  Administered 2015-08-11 – 2015-08-17 (×11): 10 mg
  Filled 2015-08-11 (×11): qty 10

## 2015-08-11 MED ORDER — QUETIAPINE FUMARATE 100 MG PO TABS
100.0000 mg | ORAL_TABLET | Freq: Two times a day (BID) | ORAL | Status: DC
  Administered 2015-08-11 – 2015-08-17 (×11): 100 mg
  Filled 2015-08-11 (×11): qty 1

## 2015-08-11 NOTE — Progress Notes (Signed)
Patient ID: [name], male   DOB: 1965/07/19, 50 y.o.   MRN: [id number] Subjective:   The patient is sedated and intubated.  Objective: Vital signs in last 24 hours: Temp:  [98.5 F (36.9 C)-101.8 F (38.8 C)] 100.9 F (38.3 C) (04/21 0800) Pulse Rate:  [81-110] 104 (04/21 0759) Resp:  [14-34] 22 (04/21 0759) BP: (89-126)/(56-83) 126/83 mmHg (04/21 0759) SpO2:  [93 %-98 %] 97 % (04/21 0800) FiO2 (%):  [30 %] 30 % (04/21 0800) Weight:  [102.5 kg (225 lb 15.5 oz)] 102.5 kg (225 lb 15.5 oz) (04/21 0434)  Intake/Output from previous day: 04/20 0701 - 04/21 0700 In: 3679.6 [I.V.:834.6; YQ/MV:7846G/GT:2695; IV Piggyback:150] Out: 3173 [Urine:3173] Intake/Output this shift:    Physical exam the patient is sedated and intubated. His pupils are equal.  Lab Results:  Recent Labs  08/10/15 0530 08/11/15 0515  WBC 12.1* 11.3*  HGB 7.9* 8.1*  HCT 26.6* 26.9*  PLT 360 364   BMET  Recent Labs  08/10/15 0530 08/11/15 0515  NA 147* 143  K 4.2 3.8  CL 107 103  CO2 30 30  GLUCOSE 144* 143*  BUN 30* 29*  CREATININE 0.76 0.71  CALCIUM 7.9* 8.1*    Studies/Results: Dg Chest Port 1 View  08/10/2015  CLINICAL DATA:  Acute respiratory failure, pneumonia, traumatic brain injury, multiple bilateral rib fractures with subsequent post traumatic pneumothorax. EXAM: PORTABLE CHEST 1 VIEW COMPARISON:  Portable chest x-ray of August 08 2015. FINDINGS: The lungs are adequately inflated. There is decreased pulmonary interstitial edema. There is persistent left lower lobe atelectasis. A small left pleural effusion remains. There is no pneumothorax. The heart normal in size. The pulmonary vascularity is less prominent and more distinct. The endotracheal tube tip lies 5.3 cm above the carina. The esophagogastric tube tip projects below the inferior margin of the image. The right-sided PICC line tip projects over the midportion of the SVC. IMPRESSION: Improving pulmonary interstitial edema,  contusion, or less likely pneumonia. No pneumothorax. Persistent small left pleural effusion and left retrocardiac atelectasis. The support tubes are in stable position. Electronically Signed   By: David  Swaziland[place] M.D.   On: 08/10/2015 07:43    Assessment/Plan: Paraplegic, traumatic brain injury: Continue supportive care.  LOS: 12 days     Krishana Lutze D 08/11/2015, 9:45 AM

## 2015-08-11 NOTE — Progress Notes (Signed)
Follow up - Trauma and Critical Care  Patient Details:    [name] is an 50 y.o. male.  Lines/tubes : Airway 7.5 mm (Active)  Secured at (cm) 24 cm 08/11/2015  8:00 AM  Measured From Lips 08/11/2015  8:00 AM  Secured Location Center 08/11/2015  8:00 AM  Secured By [employer] 08/11/2015  8:00 AM  Tube Holder Repositioned Yes 08/11/2015  8:00 AM  Cuff Pressure (cm H2O) 24 cm H2O 08/11/2015  3:34 AM  Site Condition Dry 08/11/2015  8:00 AM     PICC Triple Lumen 07/30/15 PICC Right Basilic 40 cm 0 cm (Active)  Indication for Insertion or Continuance of Line Prolonged intravenous therapies 08/10/2015  8:00 PM  Exposed Catheter (cm) 0 cm 08/05/2015  8:00 AM  Site Assessment Clean;Dry;Intact 08/10/2015  8:00 PM  Lumen #1 Status Infusing 08/10/2015  8:00 PM  Lumen #2 Status Infusing 08/10/2015  8:00 PM  Lumen #3 Status Saline locked;Flushed 08/10/2015  8:00 PM  Dressing Type Transparent;Occlusive 08/10/2015  8:00 PM  Dressing Status Clean;Dry;Intact 08/10/2015  8:00 PM  Line Care Connections checked and tightened 08/10/2015  8:00 PM  Line Adjustment (NICU/IV Team Only) No 07/30/2015 11:25 AM  Dressing Intervention Dressing changed;Antimicrobial disc changed 08/06/2015  7:00 PM  Dressing Change Due 08/13/15 08/10/2015  8:00 AM     NG/OG Tube Orogastric 16 Fr. Center mouth (Active)  Placement Verification Auscultation 08/10/2015  8:00 PM  Site Assessment Clean;Dry;Intact 08/10/2015  8:00 PM  Status Infusing tube feed 08/10/2015  8:00 PM  Amount of suction 115 mmHg 08/06/2015  8:00 PM  Drainage Appearance Tan 08/10/2015  8:00 PM  Intake (mL) 200 mL 08/10/2015 12:00 AM    Microbiology/Sepsis markers: Results for orders placed or performed during the hospital encounter of 07/30/15  MRSA PCR Screening     Status: None   Collection Time: 07/30/15  1:48 PM  Result Value Ref Range Status   MRSA by PCR NEGATIVE NEGATIVE Final    Comment:        The GeneXpert MRSA Assay (FDA approved for  NASAL specimens only), is one component of a comprehensive MRSA colonization surveillance program. It is not intended to diagnose MRSA infection nor to guide or monitor treatment for MRSA infections.   Culture, Urine     Status: None   Collection Time: 08/02/15 11:00 AM  Result Value Ref Range Status   Specimen Description URINE, CATHETERIZED  Final   Special Requests NONE  Final   Culture NO GROWTH 1 DAY  Final   Report Status 08/03/2015 FINAL  Final  Culture, respiratory (NON-Expectorated)     Status: None   Collection Time: 08/02/15 11:47 AM  Result Value Ref Range Status   Specimen Description TRACHEAL ASPIRATE  Final   Special Requests NONE  Final   Gram Stain   Final    ABUNDANT WBC PRESENT, PREDOMINANTLY PMN NO SQUAMOUS EPITHELIAL CELLS SEEN ABUNDANT GRAM NEGATIVE COCCOBACILLI Performed at [facility]    Culture   Final    ABUNDANT HAEMOPHILUS INFLUENZAE Note: BETA LACTAMASE POSITIVE Performed at [facility]    Report Status 08/04/2015 FINAL  Final  Culture, blood (Routine X 2) w Reflex to ID Panel     Status: None   Collection Time: 08/02/15 12:20 PM  Result Value Ref Range Status   Specimen Description BLOOD LEFT ANTECUBITAL  Final   Special Requests   Final    BOTTLES DRAWN AEROBIC AND ANAEROBIC 7CC AER 3CC ANA  Culture NO GROWTH 5 DAYS  Final   Report Status 08/07/2015 FINAL  Final  Culture, blood (Routine X 2) w Reflex to ID Panel     Status: None   Collection Time: 08/02/15 12:35 PM  Result Value Ref Range Status   Specimen Description BLOOD BLOOD LEFT WRIST  Final   Special Requests BOTTLES DRAWN AEROBIC ONLY 5CC  Final   Culture NO GROWTH 5 DAYS  Final   Report Status 08/07/2015 FINAL  Final    Anti-infectives:  Anti-infectives    Start     Dose/Rate Route Frequency Ordered Stop   08/04/15 1800  vancomycin (VANCOCIN) IVPB 1000 mg/200 mL premix  Status:  Discontinued     1,000 mg 200 mL/hr over 60 Minutes Intravenous Every 8  hours 08/04/15 0906 08/06/15 0817   08/04/15 1000  ceFEPIme (MAXIPIME) 2 g in dextrose 5 % 50 mL IVPB     2 g 100 mL/hr over 30 Minutes Intravenous Every 8 hours 08/04/15 0906 08/13/15 0834   08/04/15 1000  vancomycin (VANCOCIN) 2,000 mg in sodium chloride 0.9 % 500 mL IVPB     2,000 mg 250 mL/hr over 120 Minutes Intravenous  Once 08/04/15 0906 08/04/15 1446   07/31/15 1930  ceFAZolin (ANCEF) IVPB 2g/100 mL premix  Status:  Discontinued     2 g 200 mL/hr over 30 Minutes Intravenous 3 times per day 07/31/15 1923 08/04/15 0858   07/31/15 1715  ceFAZolin (ANCEF) 2-4 GM/100ML-% IVPB  Status:  Discontinued    Comments:  Key, Jennifer   : cabinet override      07/31/15 1715 07/31/15 1923   07/31/15 1436  vancomycin (VANCOCIN) powder  Status:  Discontinued       As needed 07/31/15 1436 07/31/15 1844   07/31/15 1300  bacitracin 50,000 Units in sodium chloride irrigation 0.9 % 500 mL irrigation  Status:  Discontinued       As needed 07/31/15 1353 07/31/15 1844   07/31/15 1240  vancomycin (VANCOCIN) 1000 MG powder  Status:  Discontinued    Comments:  Ratcliff, Esther   : cabinet override      07/31/15 1240 07/31/15 1248   07/31/15 1231  vancomycin (VANCOCIN) 1000 MG powder    Comments:  Dorinda Hill   : cabinet override      07/31/15 1231 08/01/15 0044      Best Practice/Protocols:  VTE Prophylaxis: Lovenox (prophylaxtic dose) and Mechanical GI Prophylaxis: Proton Pump Inhibitor Continous Sedation Precedex  Consults: Treatment Team:  Newman Pies, MD Donalee Citrin, MD Durene Romans, MD    Events:  Subjective:    Overnight Issues: Weaned for several hours yesterday, but the PS was between 12-15.  No attempts to extubate.Will follow commands.  Objective:  Vital signs for last 24 hours: Temp:  [98.5 F (36.9 C)-101.8 F (38.8 C)] 100.9 F (38.3 C) (04/21 0800) Pulse Rate:  [81-110] 104 (04/21 0759) Resp:  [14-34] 22 (04/21 0759) BP: (89-139)/(56-83) 126/83 mmHg (04/21 0759) SpO2:   [93 %-98 %] 97 % (04/21 0800) FiO2 (%):  [30 %] 30 % (04/21 0800) Weight:  [102.5 kg (225 lb 15.5 oz)] 102.5 kg (225 lb 15.5 oz) (04/21 0434)  Hemodynamic parameters for last 24 hours: CVP:  [8 mmHg-12 mmHg] 9 mmHg  Intake/Output from previous day: 04/20 0701 - 04/21 0700 In: 3679.6 [I.V.:834.6; ZO/XW:9604; IV Piggyback:150] Out: 3173 [Urine:3173]  Intake/Output this shift:    Vent settings for last 24 hours: Vent Mode:  [-] PRVC FiO2 (%):  [30 %]  30 % Set Rate:  [18 bmp] 18 bmp Vt Set:  [409 mL] 620 mL PEEP:  [5 cmH20] 5 cmH20 Pressure Support:  [12 cmH20] 12 cmH20 Plateau Pressure:  [19 cmH20-27 cmH20] 27 cmH20  Physical Exam:  General: no respiratory distress Neuro: oriented and nonfocal exam Resp: clear to auscultation bilaterally CVS: Sinus tachycardia GI: soft, nontender, BS WNL, no r/g and having bowel movements. Extremities: edema 2+ and good peripheral perfusion  Results for orders placed or performed during the hospital encounter of 07/30/15 (from the past 24 hour(s))  Glucose, capillary     Status: Abnormal   Collection Time: 08/10/15 11:35 AM  Result Value Ref Range   Glucose-Capillary 147 (H) 65 - 99 mg/dL  Glucose, capillary     Status: Abnormal   Collection Time: 08/10/15  3:48 PM  Result Value Ref Range   Glucose-Capillary 109 (H) 65 - 99 mg/dL  Basic metabolic panel     Status: Abnormal   Collection Time: 08/11/15  5:15 AM  Result Value Ref Range   Sodium 143 135 - 145 mmol/L   Potassium 3.8 3.5 - 5.1 mmol/L   Chloride 103 101 - 111 mmol/L   CO2 30 22 - 32 mmol/L   Glucose, Bld 143 (H) 65 - 99 mg/dL   BUN 29 (H) 6 - 20 mg/dL   Creatinine, Ser 8.11 0.61 - 1.24 mg/dL   Calcium 8.1 (L) 8.9 - 10.3 mg/dL   GFR calc non Af Amer >60 >60 mL/min   GFR calc Af Amer >60 >60 mL/min   Anion gap 10 5 - 15  CBC     Status: Abnormal   Collection Time: 08/11/15  5:15 AM  Result Value Ref Range   WBC 11.3 (H) 4.0 - 10.5 K/uL   RBC 2.68 (L) 4.22 - 5.81 MIL/uL    Hemoglobin 8.1 (L) 13.0 - 17.0 g/dL   HCT 91.4 (L) 78.2 - 95.6 %   MCV 100.4 (H) 78.0 - 100.0 fL   MCH 30.2 26.0 - 34.0 pg   MCHC 30.1 30.0 - 36.0 g/dL   RDW 21.3 (H) 08.6 - 57.8 %   Platelets 364 150 - 400 K/uL     Assessment/Plan:   NEURO  Altered Mental Status:  agitation, obtundation, sedation and intermitten agitation with manipulation   Plan: CPM.  Likely will get trach and G-tube on Monday  PULM  Atelectasis/collapse (focal and bilateral loweer lobes) Pneumonia: hospital acquired (not ventilator-associated) Haemophilus   Plan: Continue antibiotics.  Probably not able to extubate.  Plan for trach and PEG next week.  CARDIO  Sinus Tachycardia   Plan: No specific treatment  RENAL  No problems with urine output   Plan: CPM  GI  No specific problems.    Plan: Tolerating tube feedings.    ID  Pneumonia (hospital acquired (not ventilator-associated) Haemophilus pneumonia being treated appropriately.)   Plan: CPM with antibiotics.  Trach likely next week  HEME  Anemia acute blood loss anemia and anemia of critical illness)   Plan: No blood for now.  ENDO No specific treatment or problems   Plan: CPM  Global Issues  Patient has been weaning for the last several days, b ut because of secretions and level of consciousness we do not feel comfortable trying to extubate him.  Will plan on tracheostomy and PEG placement next week, on Monday.    LOS: 12 days   Additional comments:I reviewed the patient's new clinical lab test results. cbc/bmet and I reviewed  the patients new imaging test results. CXR from yesterday.  Critical Care Total Time*: 30 Minutes  Trixie Maclaren 08/11/2015  *Care during the described time interval was provided by me and/or other providers on the critical care team.  I have reviewed this patient's available data, including medical history, events of note, physical examination and test results as part of my evaluation.
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
## 2015-08-12 LAB — CBC WITH DIFFERENTIAL/PLATELET
BASOS PCT: 0 %
Basophils Absolute: 0 10*3/uL (ref 0.0–0.1)
EOS ABS: 0.3 10*3/uL (ref 0.0–0.7)
EOS PCT: 3 %
HEMATOCRIT: 25.8 % — AB (ref 39.0–52.0)
Hemoglobin: 7.9 g/dL — ABNORMAL LOW (ref 13.0–17.0)
LYMPHS ABS: 1.7 10*3/uL (ref 0.7–4.0)
Lymphocytes Relative: 16 %
MCH: 31 pg (ref 26.0–34.0)
MCHC: 30.6 g/dL (ref 30.0–36.0)
MCV: 101.2 fL — ABNORMAL HIGH (ref 78.0–100.0)
Monocytes Absolute: 0.6 10*3/uL (ref 0.1–1.0)
Monocytes Relative: 6 %
NEUTROS PCT: 75 %
Neutro Abs: 8.1 10*3/uL — ABNORMAL HIGH (ref 1.7–7.7)
PLATELETS: 407 10*3/uL — AB (ref 150–400)
RBC: 2.55 MIL/uL — ABNORMAL LOW (ref 4.22–5.81)
RDW: 16.1 % — ABNORMAL HIGH (ref 11.5–15.5)
WBC: 10.7 10*3/uL — AB (ref 4.0–10.5)

## 2015-08-12 LAB — BASIC METABOLIC PANEL
ANION GAP: 9 (ref 5–15)
BUN: 30 mg/dL — ABNORMAL HIGH (ref 6–20)
CHLORIDE: 105 mmol/L (ref 101–111)
CO2: 30 mmol/L (ref 22–32)
Calcium: 8 mg/dL — ABNORMAL LOW (ref 8.9–10.3)
Creatinine, Ser: 0.72 mg/dL (ref 0.61–1.24)
GFR calc Af Amer: >60 mL/min (ref >60)
Glucose, Bld: 125 mg/dL — ABNORMAL HIGH (ref 65–99)
POTASSIUM: 4.2 mmol/L (ref 3.5–5.1)
SODIUM: 144 mmol/L (ref 135–145)

## 2015-08-12 NOTE — Progress Notes (Signed)
Follow up - Trauma and Critical Care  Patient Details:    [name] is an 50 y.o. male.  Lines/tubes : Airway 7.5 mm (Active)  Secured at (cm) 25 cm 08/12/2015  4:27 AM  Measured From Lips 08/12/2015  4:27 AM  Secured Location Left 08/12/2015  4:27 AM  Secured By [employer] 08/12/2015  4:27 AM  Tube Holder Repositioned Yes 08/12/2015  4:27 AM  Cuff Pressure (cm H2O) 24 cm H2O 08/12/2015  4:27 AM  Site Condition Dry 08/12/2015  4:27 AM     PICC Triple Lumen 07/30/15 PICC Right Basilic 40 cm 0 cm (Active)  Indication for Insertion or Continuance of Line Prolonged intravenous therapies 08/11/2015  8:00 PM  Exposed Catheter (cm) 0 cm 08/05/2015  8:00 AM  Site Assessment Clean;Dry;Intact 08/11/2015  8:00 PM  Lumen #1 Status Infusing 08/11/2015  8:00 PM  Lumen #2 Status Infusing 08/11/2015  8:00 PM  Lumen #3 Status Capped (Central line) 08/11/2015  8:00 PM  Dressing Type Transparent 08/11/2015  8:00 PM  Dressing Status Clean;Dry;Intact;Antimicrobial disc in place 08/11/2015  8:00 PM  Line Care Connections checked and tightened 08/11/2015  8:00 PM  Line Adjustment (NICU/IV Team Only) No 07/30/2015 11:25 AM  Dressing Intervention Dressing changed;Antimicrobial disc changed 08/06/2015  7:00 PM  Dressing Change Due 08/13/15 08/11/2015  8:00 PM     NG/OG Tube Orogastric 16 Fr. Center mouth (Active)  Placement Verification Auscultation 08/11/2015  8:00 PM  Site Assessment Clean;Dry;Intact 08/11/2015  8:00 PM  Status Infusing tube feed 08/11/2015  8:00 PM  Amount of suction 115 mmHg 08/06/2015  8:00 PM  Drainage Appearance Tan 08/11/2015  8:00 PM  Intake (mL) 200 mL 08/10/2015 12:00 AM    Microbiology/Sepsis markers: Results for orders placed or performed during the hospital encounter of 07/30/15  MRSA PCR Screening     Status: None   Collection Time: 07/30/15  1:48 PM  Result Value Ref Range Status   MRSA by PCR NEGATIVE NEGATIVE Final    Comment:        The GeneXpert MRSA Assay  (FDA approved for NASAL specimens only), is one component of a comprehensive MRSA colonization surveillance program. It is not intended to diagnose MRSA infection nor to guide or monitor treatment for MRSA infections.   Culture, Urine     Status: None   Collection Time: 08/02/15 11:00 AM  Result Value Ref Range Status   Specimen Description URINE, CATHETERIZED  Final   Special Requests NONE  Final   Culture NO GROWTH 1 DAY  Final   Report Status 08/03/2015 FINAL  Final  Culture, respiratory (NON-Expectorated)     Status: None   Collection Time: 08/02/15 11:47 AM  Result Value Ref Range Status   Specimen Description TRACHEAL ASPIRATE  Final   Special Requests NONE  Final   Gram Stain   Final    ABUNDANT WBC PRESENT, PREDOMINANTLY PMN NO SQUAMOUS EPITHELIAL CELLS SEEN ABUNDANT GRAM NEGATIVE COCCOBACILLI Performed at [facility]    Culture   Final    ABUNDANT HAEMOPHILUS INFLUENZAE Note: BETA LACTAMASE POSITIVE Performed at [facility]    Report Status 08/04/2015 FINAL  Final  Culture, blood (Routine X 2) w Reflex to ID Panel     Status: None   Collection Time: 08/02/15 12:20 PM  Result Value Ref Range Status   Specimen Description BLOOD LEFT ANTECUBITAL  Final   Special Requests   Final    BOTTLES DRAWN AEROBIC AND ANAEROBIC 7CC  AER 3CC ANA   Culture NO GROWTH 5 DAYS  Final   Report Status 08/07/2015 FINAL  Final  Culture, blood (Routine X 2) w Reflex to ID Panel     Status: None   Collection Time: 08/02/15 12:35 PM  Result Value Ref Range Status   Specimen Description BLOOD BLOOD LEFT WRIST  Final   Special Requests BOTTLES DRAWN AEROBIC ONLY 5CC  Final   Culture NO GROWTH 5 DAYS  Final   Report Status 08/07/2015 FINAL  Final  Culture, Urine     Status: None   Collection Time: 08/10/15 10:16 AM  Result Value Ref Range Status   Specimen Description URINE, CATHETERIZED  Final   Special Requests Normal  Final   Culture NO GROWTH 1 DAY  Final    Report Status 08/11/2015 FINAL  Final    Anti-infectives:  Anti-infectives    Start     Dose/Rate Route Frequency Ordered Stop   08/04/15 1800  vancomycin (VANCOCIN) IVPB 1000 mg/200 mL premix  Status:  Discontinued     1,000 mg 200 mL/hr over 60 Minutes Intravenous Every 8 hours 08/04/15 0906 08/06/15 0817   08/04/15 1000  ceFEPIme (MAXIPIME) 2 g in dextrose 5 % 50 mL IVPB     2 g 100 mL/hr over 30 Minutes Intravenous Every 8 hours 08/04/15 0906 08/13/15 0834   08/04/15 1000  vancomycin (VANCOCIN) 2,000 mg in sodium chloride 0.9 % 500 mL IVPB     2,000 mg 250 mL/hr over 120 Minutes Intravenous  Once 08/04/15 0906 08/04/15 1446   07/31/15 1930  ceFAZolin (ANCEF) IVPB 2g/100 mL premix  Status:  Discontinued     2 g 200 mL/hr over 30 Minutes Intravenous 3 times per day 07/31/15 1923 08/04/15 0858   07/31/15 1715  ceFAZolin (ANCEF) 2-4 GM/100ML-% IVPB  Status:  Discontinued    Comments:  Key, Jennifer   : cabinet override      07/31/15 1715 07/31/15 1923   07/31/15 1436  vancomycin (VANCOCIN) powder  Status:  Discontinued       As needed 07/31/15 1436 07/31/15 1844   07/31/15 1300  bacitracin 50,000 Units in sodium chloride irrigation 0.9 % 500 mL irrigation  Status:  Discontinued       As needed 07/31/15 1353 07/31/15 1844   07/31/15 1240  vancomycin (VANCOCIN) 1000 MG powder  Status:  Discontinued    Comments:  Ratcliff, Esther   : cabinet override      07/31/15 1240 07/31/15 1248   07/31/15 1231  vancomycin (VANCOCIN) 1000 MG powder    Comments:  Dorinda Hill   : cabinet override      07/31/15 1231 08/01/15 0044      Best Practice/Protocols:  VTE Prophylaxis: Lovenox (prophylaxtic dose) and Mechanical GI Prophylaxis: Proton Pump Inhibitor Continous Sedation  Consults: Treatment Team:  Newman Pies, MD Donalee Citrin, MD Durene Romans, MD Tressie Stalker, MD    Events:  Subjective:    Overnight Issues: Patient will awaken at times and follow some commands.  Uneventful  evening.  Objective:  Vital signs for last 24 hours: Temp:  [98.1 F (36.7 C)-101.5 F (38.6 C)] 99.4 F (37.4 C) (04/22 0400) Pulse Rate:  [92-114] 100 (04/22 0600) Resp:  [19-27] 24 (04/22 0600) BP: (95-132)/(60-101) 116/81 mmHg (04/22 0600) SpO2:  [95 %-100 %] 98 % (04/22 0600) FiO2 (%):  [30 %] 30 % (04/22 0600) Weight:  [103 kg (227 lb 1.2 oz)] 103 kg (227 lb 1.2 oz) (04/22 0500)  Hemodynamic parameters for last 24 hours: CVP:  [8 mmHg-12 mmHg] 11 mmHg  Intake/Output from previous day: 04/21 0701 - 04/22 0700 In: 2499 [I.V.:854; ZO/XW:9604; IV Piggyback:150] Out: 4055 [Urine:4055]  Intake/Output this shift: Total I/O In: 1161 [I.V.:396; NG/GT:715; IV Piggyback:50] Out: 1775 [Urine:1775]  Vent settings for last 24 hours: Vent Mode:  [-] PRVC FiO2 (%):  [30 %] 30 % Set Rate:  [18 bmp] 18 bmp Vt Set:  [540 mL] 620 mL PEEP:  [5 cmH20] 5 cmH20 Pressure Support:  [12 cmH20] 12 cmH20 Plateau Pressure:  [18 cmH20-27 cmH20] 18 cmH20  Physical Exam:  General: no respiratory distress Neuro: nonfocal exam, RASS 0, RASS -1, weakness right lower extremity and weakness left lower extremity Resp: clear to auscultation bilaterally CVS: regular rate and rhythm, S1, S2 normal, no murmur, click, rub or gallop Extremities: no edema, no erythema, pulses WNL and edema 1+  Results for orders placed or performed during the hospital encounter of 07/30/15 (from the past 24 hour(s))  CBC with Differential/Platelet     Status: Abnormal (Preliminary result)   Collection Time: 08/12/15  5:00 AM  Result Value Ref Range   WBC 10.7 (H) 4.0 - 10.5 K/uL   RBC 2.55 (L) 4.22 - 5.81 MIL/uL   Hemoglobin 7.9 (L) 13.0 - 17.0 g/dL   HCT 98.1 (L) 19.1 - 47.8 %   MCV 101.2 (H) 78.0 - 100.0 fL   MCH 31.0 26.0 - 34.0 pg   MCHC 30.6 30.0 - 36.0 g/dL   RDW 29.5 (H) 62.1 - 30.8 %   Platelets 407 (H) 150 - 400 K/uL   Neutrophils Relative % PENDING %   Neutro Abs PENDING 1.7 - 7.7 K/uL   Band Neutrophils  PENDING %   Lymphocytes Relative PENDING %   Lymphs Abs PENDING 0.7 - 4.0 K/uL   Monocytes Relative PENDING %   Monocytes Absolute PENDING 0.1 - 1.0 K/uL   Eosinophils Relative PENDING %   Eosinophils Absolute PENDING 0.0 - 0.7 K/uL   Basophils Relative PENDING %   Basophils Absolute PENDING 0.0 - 0.1 K/uL   WBC Morphology PENDING    RBC Morphology PENDING    Smear Review PENDING    nRBC PENDING 0 /100 WBC   Metamyelocytes Relative PENDING %   Myelocytes PENDING %   Promyelocytes Absolute PENDING %   Blasts PENDING %  Basic metabolic panel     Status: Abnormal   Collection Time: 08/12/15  5:00 AM  Result Value Ref Range   Sodium 144 135 - 145 mmol/L   Potassium 4.2 3.5 - 5.1 mmol/L   Chloride 105 101 - 111 mmol/L   CO2 30 22 - 32 mmol/L   Glucose, Bld 125 (H) 65 - 99 mg/dL   BUN 30 (H) 6 - 20 mg/dL   Creatinine, Ser 6.57 0.61 - 1.24 mg/dL   Calcium 8.0 (L) 8.9 - 10.3 mg/dL   GFR calc non Af Amer >60 >60 mL/min   GFR calc Af Amer >60 >60 mL/min   Anion gap 9 5 - 15     Assessment/Plan:   NEURO  Altered Mental Status:  sedation   Plan: No changes for now.  PULM  Atelectasis/collapse (focal and LLL yesterday, but no CXR today.)   Plan: Check CXR tomorrow AM  CARDIO  No issues   Plan: CPM  RENAL  Urine output has been good.   Plan: CPM  GI  No issues   Plan: Tolerating tube feedings well.  CPM  ID  Pneumonia (hospital acquired (not ventilator-associated) Heamoophilus PNA, two more days of antibiotics)   Plan: CPM  HEME  Anemia acute blood loss anemia and anemia of critical illness)   Plan: NO blood for now.  ENDO No known issues   Plan: CPM  Global Issues  Patient stable overnight and no new issues.  Plans are for trach and PEG on Monday.  Not confident the patient would do well with extubation, but may give trial of that tomorrow.  Will possible try extubation tomorrow.   LOS: 13 days   Additional comments:I reviewed the patient's new clinical lab test  results. cbc only  Critical Care Total Time*: 30 Minutes  [name] 08/12/2015  *Care during the described time interval was provided by me and/or other providers on the critical care team.  I have reviewed this patient's available data, including medical history, events of note, physical examination and test results as part of my evaluation.
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
## 2015-08-13 ENCOUNTER — Inpatient Hospital Stay (HOSPITAL_COMMUNITY): Payer: BLUE CROSS/BLUE SHIELD

## 2015-08-13 LAB — CBC WITH DIFFERENTIAL/PLATELET
Basophils Absolute: 0.1 10*3/uL (ref 0.0–0.1)
Basophils Relative: 1 %
Eosinophils Absolute: 0.3 10*3/uL (ref 0.0–0.7)
Eosinophils Relative: 3 %
HEMATOCRIT: 29.4 % — AB (ref 39.0–52.0)
Hemoglobin: 8.9 g/dL — ABNORMAL LOW (ref 13.0–17.0)
LYMPHS PCT: 17 %
Lymphs Abs: 1.8 10*3/uL (ref 0.7–4.0)
MCH: 30.9 pg (ref 26.0–34.0)
MCHC: 30.3 g/dL (ref 30.0–36.0)
MCV: 102.1 fL — AB (ref 78.0–100.0)
MONO ABS: 0.7 10*3/uL (ref 0.1–1.0)
MONOS PCT: 6 %
NEUTROS ABS: 7.9 10*3/uL — AB (ref 1.7–7.7)
Neutrophils Relative %: 73 %
Platelets: 421 10*3/uL — ABNORMAL HIGH (ref 150–400)
RBC: 2.88 MIL/uL — ABNORMAL LOW (ref 4.22–5.81)
RDW: 16.4 % — AB (ref 11.5–15.5)
WBC: 10.7 10*3/uL — ABNORMAL HIGH (ref 4.0–10.5)

## 2015-08-13 LAB — BASIC METABOLIC PANEL
Anion gap: 10 (ref 5–15)
BUN: 31 mg/dL — AB (ref 6–20)
CALCIUM: 8.2 mg/dL — AB (ref 8.9–10.3)
CO2: 28 mmol/L (ref 22–32)
CREATININE: 0.69 mg/dL (ref 0.61–1.24)
Chloride: 105 mmol/L (ref 101–111)
GFR calc Af Amer: >60 mL/min (ref >60)
GFR calc non Af Amer: >60 mL/min (ref >60)
GLUCOSE: 137 mg/dL — AB (ref 65–99)
Potassium: 4 mmol/L (ref 3.5–5.1)
Sodium: 143 mmol/L (ref 135–145)

## 2015-08-13 MED ORDER — CEFAZOLIN SODIUM-DEXTROSE 2-4 GM/100ML-% IV SOLN
2.0000 g | INTRAVENOUS | Status: DC
  Filled 2015-08-13: qty 100

## 2015-08-13 MED ORDER — FUROSEMIDE 10 MG/ML PO SOLN
20.0000 mg | Freq: Two times a day (BID) | ORAL | Status: DC
  Administered 2015-08-13 – 2015-08-20 (×12): 20 mg
  Filled 2015-08-13 (×15): qty 2

## 2015-08-13 MED ORDER — KCL IN DEXTROSE-NACL 10-5-0.45 MEQ/L-%-% IV SOLN
INTRAVENOUS | Status: DC
Start: 2015-08-13 — End: 2015-08-22
  Administered 2015-08-13 – 2015-08-18 (×6): via INTRAVENOUS
  Administered 2015-08-19: 75 mL/h via INTRAVENOUS
  Administered 2015-08-20 – 2015-08-22 (×3): via INTRAVENOUS
  Filled 2015-08-13 (×20): qty 1000

## 2015-08-13 NOTE — Progress Notes (Signed)
Orthopedic Tech Progress Note Patient Details:  William NordmannDonald Wayne Logan 22-Dec-1965 [id number]  Ortho Devices Type of Ortho Device: Knee Immobilizer Ortho Device/Splint Location: bilateral Ortho Device/Splint Interventions: Application   [name] 08/13/2015, 7:47 AM

## 2015-08-13 NOTE — Progress Notes (Signed)
RN called d/t pt w/ increased WOB, tachycardic, tachypnea.  RN admin sedation- pt placed back on full vent support.

## 2015-08-13 NOTE — Progress Notes (Signed)
Follow up - Trauma and Critical Care  Patient Details:    [name] is an 50 y.o. male.  Lines/tubes : Airway 7.5 mm (Active)  Secured at (cm) 25 cm 08/13/2015  3:01 AM  Measured From Lips 08/13/2015  3:01 AM  Secured Location Center 08/13/2015  3:01 AM  Secured By [employer] 08/13/2015  3:01 AM  Tube Holder Repositioned Yes 08/13/2015  3:01 AM  Cuff Pressure (cm H2O) 22 cm H2O 08/12/2015  9:20 AM  Site Condition Dry 08/13/2015  3:01 AM     PICC Triple Lumen 07/30/15 PICC Right Basilic 40 cm 0 cm (Active)  Indication for Insertion or Continuance of Line Poor Vasculature-patient has had multiple peripheral attempts or PIVs lasting less than 24 hours 08/12/2015  8:00 PM  Exposed Catheter (cm) 0 cm 08/05/2015  8:00 AM  Site Assessment Clean;Dry;Intact 08/12/2015  8:00 PM  Lumen #1 Status Infusing 08/12/2015  8:00 PM  Lumen #2 Status Infusing 08/12/2015  8:00 PM  Lumen #3 Status Capped (Central line) 08/12/2015  8:00 PM  Dressing Type Transparent 08/12/2015  8:00 PM  Dressing Status Clean;Dry;Intact;Antimicrobial disc in place 08/12/2015  8:00 PM  Line Care Connections checked and tightened 08/12/2015  8:00 PM  Line Adjustment (NICU/IV Team Only) No 07/30/2015 11:25 AM  Dressing Intervention Dressing changed;Antimicrobial disc changed 08/06/2015  7:00 PM  Dressing Change Due 08/13/15 08/12/2015  8:00 PM     NG/OG Tube Orogastric 16 Fr. Center mouth (Active)  Placement Verification Auscultation 08/13/2015 12:00 AM  Site Assessment Clean;Dry;Intact 08/13/2015 12:00 AM  Status Infusing tube feed 08/13/2015 12:00 AM  Amount of suction 115 mmHg 08/06/2015  8:00 PM  Drainage Appearance Tan 08/13/2015 12:00 AM  Intake (mL) 200 mL 08/10/2015 12:00 AM    Microbiology/Sepsis markers: Results for orders placed or performed during the hospital encounter of 07/30/15  MRSA PCR Screening     Status: None   Collection Time: 07/30/15  1:48 PM  Result Value Ref Range Status   MRSA by PCR  NEGATIVE NEGATIVE Final    Comment:        The GeneXpert MRSA Assay (FDA approved for NASAL specimens only), is one component of a comprehensive MRSA colonization surveillance program. It is not intended to diagnose MRSA infection nor to guide or monitor treatment for MRSA infections.   Culture, Urine     Status: None   Collection Time: 08/02/15 11:00 AM  Result Value Ref Range Status   Specimen Description URINE, CATHETERIZED  Final   Special Requests NONE  Final   Culture NO GROWTH 1 DAY  Final   Report Status 08/03/2015 FINAL  Final  Culture, respiratory (NON-Expectorated)     Status: None   Collection Time: 08/02/15 11:47 AM  Result Value Ref Range Status   Specimen Description TRACHEAL ASPIRATE  Final   Special Requests NONE  Final   Gram Stain   Final    ABUNDANT WBC PRESENT, PREDOMINANTLY PMN NO SQUAMOUS EPITHELIAL CELLS SEEN ABUNDANT GRAM NEGATIVE COCCOBACILLI Performed at [facility]    Culture   Final    ABUNDANT HAEMOPHILUS INFLUENZAE Note: BETA LACTAMASE POSITIVE Performed at [facility]    Report Status 08/04/2015 FINAL  Final  Culture, blood (Routine X 2) w Reflex to ID Panel     Status: None   Collection Time: 08/02/15 12:20 PM  Result Value Ref Range Status   Specimen Description BLOOD LEFT ANTECUBITAL  Final   Special Requests   Final  BOTTLES DRAWN AEROBIC AND ANAEROBIC 7CC AER 3CC ANA   Culture NO GROWTH 5 DAYS  Final   Report Status 08/07/2015 FINAL  Final  Culture, blood (Routine X 2) w Reflex to ID Panel     Status: None   Collection Time: 08/02/15 12:35 PM  Result Value Ref Range Status   Specimen Description BLOOD BLOOD LEFT WRIST  Final   Special Requests BOTTLES DRAWN AEROBIC ONLY 5CC  Final   Culture NO GROWTH 5 DAYS  Final   Report Status 08/07/2015 FINAL  Final  Culture, Urine     Status: None   Collection Time: 08/10/15 10:16 AM  Result Value Ref Range Status   Specimen Description URINE, CATHETERIZED  Final    Special Requests Normal  Final   Culture NO GROWTH 1 DAY  Final   Report Status 08/11/2015 FINAL  Final    Anti-infectives:  Anti-infectives    Start     Dose/Rate Route Frequency Ordered Stop   08/04/15 1800  vancomycin (VANCOCIN) IVPB 1000 mg/200 mL premix  Status:  Discontinued     1,000 mg 200 mL/hr over 60 Minutes Intravenous Every 8 hours 08/04/15 0906 08/06/15 0817   08/04/15 1000  ceFEPIme (MAXIPIME) 2 g in dextrose 5 % 50 mL IVPB     2 g 100 mL/hr over 30 Minutes Intravenous Every 8 hours 08/04/15 0906 08/13/15 0231   08/04/15 1000  vancomycin (VANCOCIN) 2,000 mg in sodium chloride 0.9 % 500 mL IVPB     2,000 mg 250 mL/hr over 120 Minutes Intravenous  Once 08/04/15 0906 08/04/15 1446   07/31/15 1930  ceFAZolin (ANCEF) IVPB 2g/100 mL premix  Status:  Discontinued     2 g 200 mL/hr over 30 Minutes Intravenous 3 times per day 07/31/15 1923 08/04/15 0858   07/31/15 1715  ceFAZolin (ANCEF) 2-4 GM/100ML-% IVPB  Status:  Discontinued    Comments:  Key, Jennifer   : cabinet override      07/31/15 1715 07/31/15 1923   07/31/15 1436  vancomycin (VANCOCIN) powder  Status:  Discontinued       As needed 07/31/15 1436 07/31/15 1844   07/31/15 1300  bacitracin 50,000 Units in sodium chloride irrigation 0.9 % 500 mL irrigation  Status:  Discontinued       As needed 07/31/15 1353 07/31/15 1844   07/31/15 1240  vancomycin (VANCOCIN) 1000 MG powder  Status:  Discontinued    Comments:  Ratcliff, Esther   : cabinet override      07/31/15 1240 07/31/15 1248   07/31/15 1231  vancomycin (VANCOCIN) 1000 MG powder    Comments:  Dorinda HillKey, Jennifer   : cabinet override      07/31/15 1231 08/01/15 0044      Best Practice/Protocols:  VTE Prophylaxis: Lovenox (prophylaxtic dose) and Mechanical GI Prophylaxis: Proton Pump Inhibitor Continous Sedation  Consults: Treatment Team:  Newman PiesSu Teoh, MD Donalee CitrinGary Cram, MD Durene RomansMatthew Olin, MD Tressie StalkerJeffrey Jenkins, MD    Events:  Subjective:    Overnight  Issues: Fevers.  Objective:  Vital signs for last 24 hours: Temp:  [99.2 F (37.3 C)-102.1 F (38.9 C)] 101 F (38.3 C) (04/23 0700) Pulse Rate:  [80-109] 91 (04/23 0800) Resp:  [17-32] 18 (04/23 0800) BP: (81-124)/(53-80) 88/53 mmHg (04/23 0800) SpO2:  [94 %-100 %] 97 % (04/23 0824) FiO2 (%):  [30 %] 30 % (04/23 0824) Weight:  [103.3 kg (227 lb 11.8 oz)] 103.3 kg (227 lb 11.8 oz) (04/23 0500)  Hemodynamic parameters for last  24 hours: CVP:  [8 mmHg-14 mmHg] 11 mmHg  Intake/Output from previous day: 04/22 0701 - 04/23 0700 In: 3195 [I.V.:935; NG/GT:2160; IV Piggyback:100] Out: 2760 [Urine:2760]  Intake/Output this shift:    Vent settings for last 24 hours: Vent Mode:  [-] PRVC FiO2 (%):  [30 %] 30 % Set Rate:  [18 bmp] 18 bmp Vt Set:  [098 mL] 620 mL PEEP:  [5 cmH20] 5 cmH20 Pressure Support:  [15 cmH20] 15 cmH20 Plateau Pressure:  [18 cmH20-28 cmH20] 28 cmH20  Physical Exam:  General: no respiratory distress and although breathing at rate of 33 Neuro: nonfocal exam, RASS 0 and RASS -1 Resp: wheezes bilaterally CVS: regular rate and rhythm, S1, S2 normal, no murmur, click, rub or gallop GI: soft, nontender, BS WNL, no r/g and tolerating tube feedings well. Extremities: edema 1+ and Edema has improved  Neuro:  Patient is weak in the LE bilaterally from paraplegia  Results for orders placed or performed during the hospital encounter of 07/30/15 (from the past 24 hour(s))  Blood gas, arterial     Status: Abnormal (Preliminary result)   Collection Time: 08/12/15  9:15 AM  Result Value Ref Range   FIO2 0.30    O2 Content PENDING L/min   Delivery systems VENTILATOR    Mode PRESSURE REGULATED VOLUME CONTROL    VT 620 mL   LHR 18 resp/min   Peep/cpap 5.0 cm H20   pH, Arterial 7.458 (H) 7.350 - 7.450   pCO2 arterial 41.9 35.0 - 45.0 mmHg   pO2, Arterial 84.0 80.0 - 100.0 mmHg   Bicarbonate 28.8 (H) 20.0 - 24.0 mEq/L   TCO2 30.0 0 - 100 mmol/L   Acid-Base Excess 5.2  (H) 0.0 - 2.0 mmol/L   O2 Saturation 95.9 %   Patient temperature 100.5    Collection site LEFT RADIAL    Drawn by [id number]    Sample type ARTERIAL DRAW    Allens test (pass/fail) PASS PASS  CBC with Differential/Platelet     Status: Abnormal   Collection Time: 08/13/15  5:12 AM  Result Value Ref Range   WBC 10.7 (H) 4.0 - 10.5 K/uL   RBC 2.88 (L) 4.22 - 5.81 MIL/uL   Hemoglobin 8.9 (L) 13.0 - 17.0 g/dL   HCT 82.9 (L) 56.2 - 13.0 %   MCV 102.1 (H) 78.0 - 100.0 fL   MCH 30.9 26.0 - 34.0 pg   MCHC 30.3 30.0 - 36.0 g/dL   RDW 86.5 (H) 78.4 - 69.6 %   Platelets 421 (H) 150 - 400 K/uL   Neutrophils Relative % 73 %   Neutro Abs 7.9 (H) 1.7 - 7.7 K/uL   Lymphocytes Relative 17 %   Lymphs Abs 1.8 0.7 - 4.0 K/uL   Monocytes Relative 6 %   Monocytes Absolute 0.7 0.1 - 1.0 K/uL   Eosinophils Relative 3 %   Eosinophils Absolute 0.3 0.0 - 0.7 K/uL   Basophils Relative 1 %   Basophils Absolute 0.1 0.0 - 0.1 K/uL  Basic metabolic panel     Status: Abnormal   Collection Time: 08/13/15  5:12 AM  Result Value Ref Range   Sodium 143 135 - 145 mmol/L   Potassium 4.0 3.5 - 5.1 mmol/L   Chloride 105 101 - 111 mmol/L   CO2 28 22 - 32 mmol/L   Glucose, Bld 137 (H) 65 - 99 mg/dL   BUN 31 (H) 6 - 20 mg/dL   Creatinine, Ser 2.95 0.61 - 1.24 mg/dL  Calcium 8.2 (L) 8.9 - 10.3 mg/dL   GFR calc non Af Amer >60 >60 mL/min   GFR calc Af Amer >60 >60 mL/min   Anion gap 10 5 - 15     Assessment/Plan:   NEURO  Altered Mental Status:  sedation   Plan: Not very agitated today.  On Precedex.  PULM  Atelectasis/collapse (focal and bibasilar.)   Plan: Probably hypoventilated.    CARDIO  Sinus tachycardia   Plan: No specific treatment  RENAL  Urine output is good.  renal function is abnormal.  BUN is eelvated.   Plan: CPM, may need more free water.  GI  No specific issues   Plan: COntinue tube feedings.  ID  Pneumonia (hospital acquired (not ventilator-associated) Heamophilus pneumonia treated  and not the patient has fevers.  )   Plan: Will not restart antibiotics.  Reculture sputum  HEME  Anemia acute blood loss anemia and anemia of critical illness)   Plan: Stable and does not require transfusion.  ENDO No specific issues   Plan: CPM  Global Issues  Patient to get tracheostomy and gastrostomy tube on Monday afternoon.Will reculture sputum.    LOS: 14 days   Additional comments:I reviewed the patient's new clinical lab test results. cbc/bmet and I reviewed the patients new imaging test results. CXR  Critical Care Total Time*: 30 Minutes  Kedra Mcglade 08/13/2015  *Care during the described time interval was provided by me and/or other providers on the critical care team.  I have reviewed this patient's available data, including medical history, events of note, physical examination and test results as part of my evaluation.

## 2015-08-14 ENCOUNTER — Encounter (HOSPITAL_COMMUNITY): Payer: Self-pay | Admitting: Certified Registered"

## 2015-08-14 MED ORDER — PROPOFOL 10 MG/ML IV BOLUS
INTRAVENOUS | Status: AC
  Filled 2015-08-14: qty 20

## 2015-08-14 MED ORDER — FENTANYL CITRATE (PF) 250 MCG/5ML IJ SOLN
INTRAMUSCULAR | Status: AC
  Filled 2015-08-14: qty 5

## 2015-08-14 MED ORDER — MIDAZOLAM HCL 2 MG/2ML IJ SOLN
INTRAMUSCULAR | Status: AC
  Filled 2015-08-14: qty 2

## 2015-08-14 MED ORDER — ROCURONIUM BROMIDE 50 MG/5ML IV SOLN
INTRAVENOUS | Status: AC
  Filled 2015-08-14: qty 1
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
## 2015-08-14 NOTE — Anesthesia Preprocedure Evaluation (Deleted)
Anesthesia Evaluation  Patient identified by MRN, date of birth, ID band  Reviewed: Allergy & Precautions, NPO status , Patient's Chart, lab work & pertinent test results  Airway Mallampati: Intubated       Dental  (+) Teeth Intact   Pulmonary Current Smoker,  Intubated for respiratory failure now requiring trach       + intubated    Cardiovascular negative cardio ROS   Rhythm:Regular Rate:Normal     Neuro/Psych  TBI. C6 cervical fx. S/p emergent lumbar fusion for spinal cord compromise.    GI/Hepatic negative GI ROS, (+)     substance abuse  alcohol use,   Endo/Other    Renal/GU Renal disease (Renal contusion. Cr. stable)     Musculoskeletal   Abdominal   Peds  Hematology  (+) anemia , Splenic lac.   Anesthesia Other Findings   Reproductive/Obstetrics                             Lab Results  Component Value Date   WBC 10.7* 08/13/2015   HGB 8.9* 08/13/2015   HCT 29.4* 08/13/2015   MCV 102.1* 08/13/2015   PLT 421* 08/13/2015   Lab Results  Component Value Date   CREATININE 0.69 08/13/2015   BUN 31* 08/13/2015   NA 143 08/13/2015   K 4.0 08/13/2015   CL 105 08/13/2015   CO2 28 08/13/2015    Anesthesia Physical  Anesthesia Plan  ASA: IV  Anesthesia Plan: General   Post-op Pain Management:    Induction: Intravenous  Airway Management Planned: Oral ETT  Additional Equipment:   Intra-op Plan:   Post-operative Plan: Post-operative intubation/ventilation  Informed Consent: I have reviewed the patients History and Physical, chart, labs and discussed the procedure including the risks, benefits and alternatives for the proposed anesthesia with the patient or authorized representative who has indicated his/her understanding and acceptance.   Dental advisory given  Plan Discussed with: Anesthesiologist, CRNA and Surgeon  Anesthesia Plan Comments:          Anesthesia Quick Evaluation
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
## 2015-08-15 ENCOUNTER — Inpatient Hospital Stay (HOSPITAL_COMMUNITY): Payer: BLUE CROSS/BLUE SHIELD

## 2015-08-15 ENCOUNTER — Inpatient Hospital Stay (HOSPITAL_COMMUNITY): Payer: BLUE CROSS/BLUE SHIELD | Admitting: Certified Registered"

## 2015-08-15 ENCOUNTER — Encounter (HOSPITAL_COMMUNITY): Admission: EM | Disposition: A | Discharge status: Skilled Nursing Facility | Payer: Self-pay | Source: Home / Self Care

## 2015-08-15 HISTORY — PX: PERCUTANEOUS TRACHEOSTOMY: SHX5288

## 2015-08-15 LAB — BASIC METABOLIC PANEL
ANION GAP: 7 (ref 5–15)
BUN: 20 mg/dL (ref 6–20)
CO2: 22 mmol/L (ref 22–32)
Calcium: 6.7 mg/dL — ABNORMAL LOW (ref 8.9–10.3)
Chloride: 115 mmol/L — ABNORMAL HIGH (ref 101–111)
Creatinine, Ser: 0.51 mg/dL — ABNORMAL LOW (ref 0.61–1.24)
GFR calc Af Amer: >60 mL/min (ref >60)
Glucose, Bld: 103 mg/dL — ABNORMAL HIGH (ref 65–99)
POTASSIUM: 3.4 mmol/L — AB (ref 3.5–5.1)
SODIUM: 144 mmol/L (ref 135–145)

## 2015-08-15 LAB — CBC
HEMATOCRIT: 24.1 % — AB (ref 39.0–52.0)
HEMOGLOBIN: 7.2 g/dL — AB (ref 13.0–17.0)
MCH: 30 pg (ref 26.0–34.0)
MCHC: 29.9 g/dL — ABNORMAL LOW (ref 30.0–36.0)
MCV: 100.4 fL — ABNORMAL HIGH (ref 78.0–100.0)
Platelets: 363 10*3/uL (ref 150–400)
RBC: 2.4 MIL/uL — ABNORMAL LOW (ref 4.22–5.81)
RDW: 16 % — AB (ref 11.5–15.5)
WBC: 11.1 10*3/uL — AB (ref 4.0–10.5)

## 2015-08-15 SURGERY — CREATION, TRACHEOSTOMY, PERCUTANEOUS
Anesthesia: General | Site: Neck

## 2015-08-15 MED ORDER — PROPOFOL 10 MG/ML IV BOLUS
INTRAVENOUS | Status: AC
  Filled 2015-08-15: qty 20

## 2015-08-15 MED ORDER — CEFAZOLIN SODIUM 1 G IJ SOLR
INTRAMUSCULAR | Status: DC | PRN
  Administered 2015-08-15: 2 g via INTRAMUSCULAR

## 2015-08-15 MED ORDER — ROCURONIUM BROMIDE 50 MG/5ML IV SOLN
INTRAVENOUS | Status: AC
  Filled 2015-08-15: qty 2

## 2015-08-15 MED ORDER — LACTATED RINGERS IV SOLN
INTRAVENOUS | Status: DC | PRN
  Administered 2015-08-15: 11:00:00 via INTRAVENOUS

## 2015-08-15 MED ORDER — ROCURONIUM BROMIDE 100 MG/10ML IV SOLN
INTRAVENOUS | Status: DC | PRN
  Administered 2015-08-15 (×2): 50 mg via INTRAVENOUS

## 2015-08-15 MED ORDER — MIDAZOLAM HCL 2 MG/2ML IJ SOLN
INTRAMUSCULAR | Status: AC
  Filled 2015-08-15: qty 2

## 2015-08-15 MED ORDER — CEFAZOLIN SODIUM-DEXTROSE 2-4 GM/100ML-% IV SOLN: 2.0000 g | Freq: Once | INTRAVENOUS | Status: DC

## 2015-08-15 MED ORDER — MIDAZOLAM HCL 2 MG/2ML IJ SOLN
2.0000 mg | Freq: Once | INTRAMUSCULAR | Status: AC
  Administered 2015-08-15: 2 mg via INTRAVENOUS

## 2015-08-15 MED ORDER — FENTANYL CITRATE (PF) 250 MCG/5ML IJ SOLN
INTRAMUSCULAR | Status: AC
  Filled 2015-08-15: qty 5

## 2015-08-15 MED ORDER — PHENYLEPHRINE HCL 10 MG/ML IJ SOLN
INTRAMUSCULAR | Status: DC | PRN
  Administered 2015-08-15 (×3): 80 ug via INTRAVENOUS
  Administered 2015-08-15 (×2): 120 ug via INTRAVENOUS

## 2015-08-15 MED ORDER — 0.9 % SODIUM CHLORIDE (POUR BTL) OPTIME
TOPICAL | Status: DC | PRN
  Administered 2015-08-15: 1000 mL

## 2015-08-15 MED ORDER — EPHEDRINE SULFATE 50 MG/ML IJ SOLN
INTRAMUSCULAR | Status: AC
  Filled 2015-08-15: qty 1

## 2015-08-15 MED ORDER — FENTANYL CITRATE (PF) 100 MCG/2ML IJ SOLN
100.0000 ug | Freq: Once | INTRAMUSCULAR | Status: AC
  Administered 2015-08-15: 100 ug via INTRAVENOUS

## 2015-08-15 MED ORDER — STERILE WATER FOR INJECTION IJ SOLN
INTRAMUSCULAR | Status: AC
  Filled 2015-08-15: qty 10

## 2015-08-15 MED ORDER — LIDOCAINE-EPINEPHRINE 1.5 %-1:200,000 OPTIME - NO CHARGE
INTRAMUSCULAR | Status: DC | PRN
  Administered 2015-08-15: 5 mL via SUBCUTANEOUS

## 2015-08-15 SURGICAL SUPPLY — 29 items
DRAPE LAPAROTOMY 100X72 PEDS (DRAPES) ×3 IMPLANT
DRAPE PROXIMA HALF (DRAPES) ×6 IMPLANT
DRAPE UTILITY XL STRL (DRAPES) ×6 IMPLANT
ELECT CAUTERY BLADE 6.4 (BLADE) ×3 IMPLANT
ELECT REM PT RETURN 9FT ADLT (ELECTROSURGICAL) ×3
ELECTRODE REM PT RTRN 9FT ADLT (ELECTROSURGICAL) ×1 IMPLANT
GAUZE SPONGE 4X4 16PLY XRAY LF (GAUZE/BANDAGES/DRESSINGS) ×3 IMPLANT
GLOVE BIO SURGEON STRL SZ 6.5 (GLOVE) ×2 IMPLANT
GLOVE BIO SURGEON STRL SZ7.5 (GLOVE) ×3 IMPLANT
GLOVE BIO SURGEON STRL SZ8 (GLOVE) ×6 IMPLANT
GLOVE BIO SURGEONS STRL SZ 6.5 (GLOVE) ×1
GLOVE BIOGEL PI IND STRL 7.5 (GLOVE) ×1 IMPLANT
GLOVE BIOGEL PI IND STRL 8 (GLOVE) ×2 IMPLANT
GLOVE BIOGEL PI INDICATOR 7.5 (GLOVE) ×2
GLOVE BIOGEL PI INDICATOR 8 (GLOVE) ×4
GOWN STRL REUS W/ TWL LRG LVL3 (GOWN DISPOSABLE) ×1 IMPLANT
GOWN STRL REUS W/ TWL XL LVL3 (GOWN DISPOSABLE) ×2 IMPLANT
GOWN STRL REUS W/TWL LRG LVL3 (GOWN DISPOSABLE) ×2
GOWN STRL REUS W/TWL XL LVL3 (GOWN DISPOSABLE) ×4
INTRODUCER TRACH BLUE RHINO 6F (TUBING) ×3 IMPLANT
INTRODUCER TRACH BLUE RHINO 8F (TUBING) IMPLANT
PENCIL BUTTON HOLSTER BLD 10FT (ELECTRODE) ×3 IMPLANT
SPONGE INTESTINAL PEANUT (DISPOSABLE) ×3 IMPLANT
SUT SILK 3 0 SH CR/8 (SUTURE) ×3 IMPLANT
SUT VICRYL AB 3 0 TIES (SUTURE) ×3 IMPLANT
TOWEL OR 17X24 6PK STRL BLUE (TOWEL DISPOSABLE) ×3 IMPLANT
TUBE CONNECTING 12'X1/4 (SUCTIONS) ×1
TUBE CONNECTING 12X1/4 (SUCTIONS) ×2 IMPLANT
YANKAUER SUCT BULB TIP NO VENT (SUCTIONS) ×3 IMPLANT

## 2015-08-15 NOTE — Progress Notes (Signed)
Patient returned from OR and placed on ventilator, on full support, without any complications.

## 2015-08-15 NOTE — Progress Notes (Signed)
Patient taken off of ventilator and bagged to OR by CRNA, without any complications.

## 2015-08-15 NOTE — Procedures (Signed)
Intubation Procedure Note William NordmannDonald Wayne Logan [id number] Jun 02, 1965  Procedure: Intubation Indications: ETT needed to be changed  Procedure Details Consent: Unable to obtain consent because of emergent medical necessity. Time Out: Verified patient identification, verified procedure, site/side was marked, verified correct patient position, special equipment/implants available, medications/allergies/relevent history reviewed, required imaging and test results available.  Performed  Maximum sterile technique was used including gloves, hand hygiene and mask.  MAC and 4    Evaluation Hemodynamic Status: BP stable throughout; O2 sats: stable throughout Patient's Current Condition: stable Complications: No apparent complications Patient did tolerate procedure well. Chest X-ray ordered to verify placement.  CXR: tube position acceptable.    [name], [name] 08/15/2015

## 2015-08-15 NOTE — Progress Notes (Signed)
SLP Cancellation Note  Patient Details Name: William NordmannDonald Wayne Logan MRN: [id number] DOB: 09-15-1965   Cancelled treatment:        Pt just had trach placed today. Will continue to f/u to provide cognition tx. Noted PEG placement next date.   Lynita LombardLauren Laurens Matheny 08/15/2015, 1:27 PM

## 2015-08-15 NOTE — Op Note (Signed)
07/30/2015 - 08/15/2015  12:09 PM  PATIENT:  [name]  50 y.o. male  PRE-OPERATIVE DIAGNOSIS:  Respiratory Failure  POST-OPERATIVE DIAGNOSIS:  Respiratory Failure  PROCEDURE:  Procedure(s): PERCUTANEOUS TRACHEOSTOMY #6 SHILEY  SURGEON:  Surgeon(s): Violeta GelinasBurke Maddix Kliewer, MD  ASSISTANTS: Charma IgoMichael Jeffery, [facility]   ANESTHESIA:   local and general  EBL:  Total I/O In: 383.2 [I.V.:383.2] Out: -   BLOOD ADMINISTERED:none  DRAINS: none   SPECIMEN:  No Specimen  DISPOSITION OF SPECIMEN:  N/A  COUNTS:  YES  DICTATION: .Dragon Dictation Procedure in detail: [name] is brought down for tracheostomy. Informed consent was obtained from his wife. He was brought down from the intensive care unit to the operating room on the ventilator. He received IV antibiotics. General anesthesia was administered by the anesthesia staff. The anterior portion of his collar was removed and his head was taped to hold it in neutral position. Anterior neck was prepped and draped in a sterile fashion. We did a time out procedure. Local was injected 2 cm cephalad from the sternal notch. Transverse incision was made. Dense tissues were dissected down through the platysma. Strap muscles were split along the midline. There was an anterior jugular vein running superiorly and this was suture ligated. The tracheal isthmus was divided with cautery getting good hemostasis. The anterior surface of the trachea was exposed. Endotracheal tube was pulled back and an Angiocath was inserted between the second and third tracheal ring. This was dilated up with a small Blue Rhino dilator followed by the large Blue Rhino dilator. Next the trach tube was inserted over a 24 JamaicaFrench dilator. This was hooked up to the ventilator circuit and excellent volume returns were obtained. A drain sponge was placed in the tracheostomy tube was sutured to the skin. There was no bleeding. Velcro trach tie was applied. All counts were correct. He  tolerated procedure well and was taken directly back to the intensive care unit on the ventilator. We will check a chest x-ray postoperatively. PATIENT DISPOSITION:  ICU - intubated and hemodynamically stable.   Delay start of Pharmacological VTE agent (>24hrs) due to surgical blood loss or risk of bleeding:  no  Violeta GelinasBurke Amere Bricco, MD, MPH, FACS Pager: [phone]  4/25/201712:09 PM

## 2015-08-15 NOTE — Anesthesia Procedure Notes (Signed)
Date/Time: 08/15/2015 11:20 AM Performed by: Rosiland OzMEYERS, Stuart Mirabile Pre-anesthesia Checklist: Patient identified, Timeout performed, Emergency Drugs available, Suction available and Patient being monitored Patient Re-evaluated:Patient Re-evaluated prior to inductionOxygen Delivery Method: Circle system utilized Preoxygenation: Pre-oxygenation with 100% oxygen Intubation Type: Inhalational induction Tube type: Subglottic suction tube (pt arrived intubated) Placement Confirmation: breath sounds checked- equal and bilateral and positive ETCO2

## 2015-08-15 NOTE — Progress Notes (Signed)
Patient ID: [name], male   DOB: [birth year], 50 y.o.   MRN: [id number] Will proceed with trach in OR today. Consent obtained from his wife by phone (she had previously signed for Dr. Lindie SpruceWyatt to do it yesterday). Endo cannot do PEG today - will try to schedule for tomorrow. William GelinasBurke Staysha Truby, MD, MPH, FACS Trauma: [phone] General Surgery: [phone]

## 2015-08-15 NOTE — Anesthesia Postprocedure Evaluation (Addendum)
Anesthesia Post Note  Patient: [name]  Procedure(s) Performed: Procedure(s) (LRB): PERCUTANEOUS TRACHEOSTOMY (N/A)  Patient location during evaluation: ICU Anesthesia Type: General Level of consciousness: patient remains intubated per anesthesia plan Vital Signs Assessment: post-procedure vital signs reviewed and stable Respiratory status: patient remains intubated per anesthesia plan Cardiovascular status: blood pressure returned to baseline and stable Anesthetic complications: no    Last Vitals:  Filed Vitals:   08/15/15 1500 08/15/15 1600  BP: 107/71 104/73  Pulse: 86   Temp:  37.2 C  Resp: 19 23    Last Pain:  Filed Vitals:   08/15/15 1615  PainSc: Asleep                 Karlei Waldo JENNETTE

## 2015-08-15 NOTE — Progress Notes (Signed)
RT note: RT pulled et tube do to occlusion air trapping and patient becoming  Unstable. Bag masked patient until MD arrived.Vital signs stable until re intubated.

## 2015-08-15 NOTE — Progress Notes (Signed)
Called to bedside per RN because pt was difficult to bag and had high peak pressures. On arrival pt had begun to become bradycardic and ETT was pulled.  Pt was easy to bag at that point and all resistance was removed.  ETT appeared to have large mucus plug creating one way valve.  Pt successfully intubated per RT.   CXR pending.

## 2015-08-15 NOTE — Progress Notes (Signed)
OT Cancellation Note  Patient Details Name: William NordmannDonald Wayne Logan MRN: [id number] DOB: 05-03-1965   Cancelled Treatment:    Reason Eval/Treat Not Completed: Patient at procedure or test/ unavailable  Angelene GiovanniConarpe, Kamila Broda M  Harry Shuck [place], OTR/L [phone]  08/15/2015, 12:06 PM

## 2015-08-15 NOTE — Progress Notes (Addendum)
Pt noted to be tachypniec, breathing over the vent despite RT interventions, lung sounds diminished bilaterally,and diaphoretic.  On call MD notified, new orders carried out. Will continue to monitor. Dicie BeamFrazier, Willine Schwalbe RN BSN

## 2015-08-15 NOTE — Progress Notes (Signed)
Patient had increased peak pressures and increased WOB, unable to pass suction catheter. Charge RT reintubated and after pulling previous ETT tube there was a plug on the end of the tube over 4cm long. Patient is tolerating reintubation well and RT will continue to monitor.

## 2015-08-15 NOTE — Transfer of Care (Signed)
Immediate Anesthesia Transfer of Care Note  Patient: William NordmannDonald Wayne Logan  Procedure(s) Performed: Procedure(s): PERCUTANEOUS TRACHEOSTOMY (N/A)  Patient Location: NICU  Anesthesia Type:General  Level of Consciousness: sedated and unresponsive  Airway & Oxygen Therapy: Patient connected to tracheostomy mask oxygen and Patient placed on Ventilator (see vital sign flow sheet for setting)  Post-op Assessment: Report given to RN and Post -op Vital signs reviewed and stable  Post vital signs: Reviewed and stable  Last Vitals:  Filed Vitals:   08/15/15 1100 08/15/15 1236  BP: 124/107 90/57  Pulse: 107 91  Temp:    Resp: 21 18    Complications: No apparent anesthesia complications

## 2015-08-15 NOTE — Progress Notes (Addendum)
PT Cancellation Note  Patient Details Name: William NordmannDonald Wayne Lax MRN: [id number] DOB: [birth year]   Cancelled Treatment:    Reason Eval/Treat Not Completed: Patient at procedure or test/unavailable.  Pt in OR for trach.  PT will check back tomorrow (planned possible PEG tomorrow).  Thanks,    Rollene Rotundaebecca B. Jackye Dever, PT, DPT [phone]   08/15/2015, 11:52 AM
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
## 2015-08-16 ENCOUNTER — Encounter (HOSPITAL_COMMUNITY): Admission: EM | Disposition: A | Discharge status: Skilled Nursing Facility | Payer: Self-pay | Source: Home / Self Care

## 2015-08-16 ENCOUNTER — Inpatient Hospital Stay (HOSPITAL_COMMUNITY): Payer: BLUE CROSS/BLUE SHIELD

## 2015-08-16 LAB — CULTURE, RESPIRATORY W GRAM STAIN: Special Requests: NORMAL

## 2015-08-16 LAB — BLOOD GAS, ARTERIAL
Acid-Base Excess: 5.2 mmol/L — ABNORMAL HIGH (ref 0.0–2.0)
Bicarbonate: 28.8 mEq/L — ABNORMAL HIGH (ref 20.0–24.0)
Drawn by: 246101
FIO2: 0.3
MECHVT: 620 mL
O2 Saturation: 95.9 %
PEEP: 5 cmH2O
Patient temperature: 100.5
RATE: 18 resp/min
TCO2: 30 mmol/L (ref 0–100)
pCO2 arterial: 41.9 mmHg (ref 35.0–45.0)
pH, Arterial: 7.458 — ABNORMAL HIGH (ref 7.350–7.450)
pO2, Arterial: 84 mmHg (ref 80.0–100.0)

## 2015-08-16 LAB — BASIC METABOLIC PANEL
Anion gap: 9 (ref 5–15)
BUN: 20 mg/dL (ref 6–20)
CHLORIDE: 108 mmol/L (ref 101–111)
CO2: 25 mmol/L (ref 22–32)
CREATININE: 0.59 mg/dL — AB (ref 0.61–1.24)
Calcium: 7.9 mg/dL — ABNORMAL LOW (ref 8.9–10.3)
GFR calc Af Amer: >60 mL/min (ref >60)
GFR calc non Af Amer: >60 mL/min (ref >60)
Glucose, Bld: 128 mg/dL — ABNORMAL HIGH (ref 65–99)
POTASSIUM: 3.7 mmol/L (ref 3.5–5.1)
SODIUM: 142 mmol/L (ref 135–145)

## 2015-08-16 LAB — CBC
HCT: 26.2 % — ABNORMAL LOW (ref 39.0–52.0)
HEMOGLOBIN: 7.8 g/dL — AB (ref 13.0–17.0)
MCH: 29.8 pg (ref 26.0–34.0)
MCHC: 29.8 g/dL — AB (ref 30.0–36.0)
MCV: 100 fL (ref 78.0–100.0)
PLATELETS: 379 10*3/uL (ref 150–400)
RBC: 2.62 MIL/uL — AB (ref 4.22–5.81)
RDW: 16 % — ABNORMAL HIGH (ref 11.5–15.5)
WBC: 9.7 10*3/uL (ref 4.0–10.5)

## 2015-08-16 LAB — CULTURE, RESPIRATORY

## 2015-08-16 SURGERY — CREATION, TRACHEOSTOMY, PERCUTANEOUS
Anesthesia: General

## 2015-08-16 MED ORDER — AMANTADINE HCL 50 MG/5ML PO SYRP
150.0000 mg | ORAL_SOLUTION | Freq: Two times a day (BID) | ORAL | Status: DC
  Administered 2015-08-16 – 2015-09-03 (×36): 150 mg
  Filled 2015-08-16 (×38): qty 15

## 2015-08-16 MED ORDER — LEVOFLOXACIN IN D5W 750 MG/150ML IV SOLN
750.0000 mg | INTRAVENOUS | Status: AC
  Administered 2015-08-16 – 2015-08-25 (×10): 750 mg via INTRAVENOUS
  Filled 2015-08-16 (×11): qty 150
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
## 2015-08-16 NOTE — Progress Notes (Signed)
Follow up - Trauma and Critical Care  Patient Details:    [name] is an 50 y.o. male.  Lines/tubes : PICC Triple Lumen 07/30/15 PICC Right Basilic 40 cm 0 cm (Active)  Indication for Insertion or Continuance of Line Prolonged intravenous therapies 08/15/2015  8:00 PM  Exposed Catheter (cm) 0 cm 08/05/2015  8:00 AM  Site Assessment Clean;Dry;Intact 08/15/2015  8:00 PM  Lumen #1 Status Infusing 08/15/2015  8:00 PM  Lumen #2 Status Infusing 08/15/2015  8:00 PM  Lumen #3 Status Saline locked;Flushed 08/15/2015  8:00 PM  Dressing Type Transparent 08/15/2015  8:00 PM  Dressing Status Clean;Dry;Intact;Antimicrobial disc in place 08/15/2015  8:00 PM  Line Care Connections checked and tightened 08/15/2015  8:00 PM  Line Adjustment (NICU/IV Team Only) No 07/30/2015 11:25 AM  Dressing Intervention New dressing;Dressing changed;Antimicrobial disc changed 08/14/2015  5:00 AM  Dressing Change Due 08/21/15 08/14/2015  8:00 PM     NG/OG Tube Orogastric 16 Fr. Center mouth (Active)  Placement Verification Auscultation 08/15/2015  8:00 PM  Site Assessment Clean;Dry;Intact 08/15/2015  8:00 PM  Status Infusing tube feed 08/15/2015  8:00 PM  Amount of suction 115 mmHg 08/06/2015  8:00 PM  Drainage Appearance Tan 08/13/2015 12:00 AM  Intake (mL) 90 mL 08/14/2015 10:00 AM    Microbiology/Sepsis markers: Results for orders placed or performed during the hospital encounter of 07/30/15  MRSA PCR Screening     Status: None   Collection Time: 07/30/15  1:48 PM  Result Value Ref Range Status   MRSA by PCR NEGATIVE NEGATIVE Final    Comment:        The GeneXpert MRSA Assay (FDA approved for NASAL specimens only), is one component of a comprehensive MRSA colonization surveillance program. It is not intended to diagnose MRSA infection nor to guide or monitor treatment for MRSA infections.   Culture, Urine     Status: None   Collection Time: 08/02/15 11:00 AM  Result Value Ref Range Status   Specimen  Description URINE, CATHETERIZED  Final   Special Requests NONE  Final   Culture NO GROWTH 1 DAY  Final   Report Status 08/03/2015 FINAL  Final  Culture, respiratory (NON-Expectorated)     Status: None   Collection Time: 08/02/15 11:47 AM  Result Value Ref Range Status   Specimen Description TRACHEAL ASPIRATE  Final   Special Requests NONE  Final   Gram Stain   Final    ABUNDANT WBC PRESENT, PREDOMINANTLY PMN NO SQUAMOUS EPITHELIAL CELLS SEEN ABUNDANT GRAM NEGATIVE COCCOBACILLI Performed at [facility]    Culture   Final    ABUNDANT HAEMOPHILUS INFLUENZAE Note: BETA LACTAMASE POSITIVE Performed at [facility]    Report Status 08/04/2015 FINAL  Final  Culture, blood (Routine X 2) w Reflex to ID Panel     Status: None   Collection Time: 08/02/15 12:20 PM  Result Value Ref Range Status   Specimen Description BLOOD LEFT ANTECUBITAL  Final   Special Requests   Final    BOTTLES DRAWN AEROBIC AND ANAEROBIC 7CC AER 3CC ANA   Culture NO GROWTH 5 DAYS  Final   Report Status 08/07/2015 FINAL  Final  Culture, blood (Routine X 2) w Reflex to ID Panel     Status: None   Collection Time: 08/02/15 12:35 PM  Result Value Ref Range Status   Specimen Description BLOOD BLOOD LEFT WRIST  Final   Special Requests BOTTLES DRAWN AEROBIC ONLY 5CC  Final   Culture  NO GROWTH 5 DAYS  Final   Report Status 08/07/2015 FINAL  Final  Culture, Urine     Status: None   Collection Time: 08/10/15 10:16 AM  Result Value Ref Range Status   Specimen Description URINE, CATHETERIZED  Final   Special Requests Normal  Final   Culture NO GROWTH 1 DAY  Final   Report Status 08/11/2015 FINAL  Final  Culture, respiratory (NON-Expectorated)     Status: None   Collection Time: 08/13/15  9:58 AM  Result Value Ref Range Status   Specimen Description TRACHEAL ASPIRATE  Final   Special Requests Normal  Final   Gram Stain   Final    ABUNDANT WBC PRESENT, PREDOMINANTLY PMN RARE SQUAMOUS EPITHELIAL CELLS  PRESENT FEW GRAM POSITIVE COCCI IN PAIRS IN CLUSTERS RARE GRAM NEGATIVE RODS Performed at [facility]    Culture   Final    FEW ACINETOBACTER CALCOACETICUS/BAUMANNII COMPLEX Performed at [facility]    Report Status 08/16/2015 FINAL  Final   Organism ID, Bacteria ACINETOBACTER CALCOACETICUS/BAUMANNII COMPLEX  Final      Susceptibility   Acinetobacter calcoaceticus/baumannii complex - MIC*    CEFTAZIDIME 4 SENSITIVE Sensitive     CIPROFLOXACIN <=0.25 SENSITIVE Sensitive     GENTAMICIN 2 SENSITIVE Sensitive     IMIPENEM <=0.25 SENSITIVE Sensitive     PIP/TAZO <=4 SENSITIVE Sensitive     TOBRAMYCIN <=1 SENSITIVE Sensitive     * FEW ACINETOBACTER CALCOACETICUS/BAUMANNII COMPLEX    Anti-infectives:  Anti-infectives    Start     Dose/Rate Route Frequency Ordered Stop   08/15/15 1200  ceFAZolin (ANCEF) IVPB 2g/100 mL premix     2 g 200 mL/hr over 30 Minutes Intravenous  Once 08/15/15 0858     08/14/15 1200  ceFAZolin (ANCEF) IVPB 2g/100 mL premix  Status:  Discontinued     2 g 200 mL/hr over 30 Minutes Intravenous On call to O.R. 08/13/15 2130 08/15/15 0858   08/04/15 1800  vancomycin (VANCOCIN) IVPB 1000 mg/200 mL premix  Status:  Discontinued     1,000 mg 200 mL/hr over 60 Minutes Intravenous Every 8 hours 08/04/15 0906 08/06/15 0817   08/04/15 1000  ceFEPIme (MAXIPIME) 2 g in dextrose 5 % 50 mL IVPB     2 g 100 mL/hr over 30 Minutes Intravenous Every 8 hours 08/04/15 0906 08/13/15 0231   08/04/15 1000  vancomycin (VANCOCIN) 2,000 mg in sodium chloride 0.9 % 500 mL IVPB     2,000 mg 250 mL/hr over 120 Minutes Intravenous  Once 08/04/15 0906 08/04/15 1446   07/31/15 1930  ceFAZolin (ANCEF) IVPB 2g/100 mL premix  Status:  Discontinued     2 g 200 mL/hr over 30 Minutes Intravenous 3 times per day 07/31/15 1923 08/04/15 0858   07/31/15 1715  ceFAZolin (ANCEF) 2-4 GM/100ML-% IVPB  Status:  Discontinued    Comments:  Key, Jennifer   : cabinet override      07/31/15  1715 07/31/15 1923   07/31/15 1436  vancomycin (VANCOCIN) powder  Status:  Discontinued       As needed 07/31/15 1436 07/31/15 1844   07/31/15 1300  bacitracin 50,000 Units in sodium chloride irrigation 0.9 % 500 mL irrigation  Status:  Discontinued       As needed 07/31/15 1353 07/31/15 1844   07/31/15 1240  vancomycin (VANCOCIN) 1000 MG powder  Status:  Discontinued    Comments:  Karmen Stabs   : cabinet override      07/31/15  1240 07/31/15 1248   07/31/15 1231  vancomycin (VANCOCIN) 1000 MG powder    Comments:  Dorinda Hill   : cabinet override      07/31/15 1231 08/01/15 0044      Best Practice/Protocols:  VTE Prophylaxis: Lovenox (prophylaxtic dose) and Mechanical GI Prophylaxis: Proton Pump Inhibitor Intermittent Sedation  Consults: Treatment Team:  Newman Pies, MD Donalee Citrin, MD Durene Romans, MD    Events:  Subjective:    Overnight Issues: Patient got trachesotomy yesterday, due for PEG tomorrow.  Weaned yesterday, but did not get to trach collar according to RT.  Objective:  Vital signs for last 24 hours: Temp:  [99 F (37.2 C)-100.7 F (38.2 C)] 100.7 F (38.2 C) (04/26 0800) Pulse Rate:  [86-107] 98 (04/26 0836) Resp:  [12-24] 19 (04/26 0836) BP: (90-126)/(57-107) 126/90 mmHg (04/26 0836) SpO2:  [96 %-100 %] 100 % (04/26 0925) FiO2 (%):  [30 %-70 %] 30 % (04/26 0925) Weight:  [98.8 kg (217 lb 13 oz)] 98.8 kg (217 lb 13 oz) (04/26 0355)  Hemodynamic parameters for last 24 hours: CVP:  [12 mmHg-18 mmHg] 16 mmHg  Intake/Output from previous day: 04/25 0701 - 04/26 0700 In: 3684.8 [I.V.:2952.8; NG/GT:732] Out: 2085 [Urine:2075; Blood:10]  Intake/Output this shift:    Vent settings for last 24 hours: Vent Mode:  [-] CPAP;PSV FiO2 (%):  [30 %-70 %] 30 % Set Rate:  [18 bmp] 18 bmp Vt Set:  [620 mL] 620 mL PEEP:  [5 cmH20] 5 cmH20 Pressure Support:  [12 cmH20-14 cmH20] 14 cmH20 Plateau Pressure:  [17 cmH20-21 cmH20] 17 cmH20  Physical Exam:  Neuro:  alert, nonfocal exam, RASS 0 and agitated Resp: clear to auscultation bilaterally CVS: sinus tachycardia GI: soft, nontender, BS WNL, no r/g and tolerating tube feedings. Extremities: edema 1+ and mild edema  Results for orders placed or performed during the hospital encounter of 07/30/15 (from the past 24 hour(s))  CBC     Status: Abnormal   Collection Time: 08/16/15  5:40 AM  Result Value Ref Range   WBC 9.7 4.0 - 10.5 K/uL   RBC 2.62 (L) 4.22 - 5.81 MIL/uL   Hemoglobin 7.8 (L) 13.0 - 17.0 g/dL   HCT 16.1 (L) 09.6 - 04.5 %   MCV 100.0 78.0 - 100.0 fL   MCH 29.8 26.0 - 34.0 pg   MCHC 29.8 (L) 30.0 - 36.0 g/dL   RDW 40.9 (H) 81.1 - 91.4 %   Platelets 379 150 - 400 K/uL  Basic metabolic panel     Status: Abnormal   Collection Time: 08/16/15  5:40 AM  Result Value Ref Range   Sodium 142 135 - 145 mmol/L   Potassium 3.7 3.5 - 5.1 mmol/L   Chloride 108 101 - 111 mmol/L   CO2 25 22 - 32 mmol/L   Glucose, Bld 128 (H) 65 - 99 mg/dL   BUN 20 6 - 20 mg/dL   Creatinine, Ser 7.82 (L) 0.61 - 1.24 mg/dL   Calcium 7.9 (L) 8.9 - 10.3 mg/dL   GFR calc non Af Amer >60 >60 mL/min   GFR calc Af Amer >60 >60 mL/min   Anion gap 9 5 - 15     Assessment/Plan:   NEURO  Altered Mental Status:  agitation, delirium and sedation   Plan: Wean sedation as tolerated  PULM  Atelectasis/collapse (focal and LLL)   Plan: Continue to wean ventilator as tolerated  CARDIO  Sinus Tachycardia   Plan: No specific treatment  RENAL  Good urine output and renal function   Plan: CPM  GI  No specific issues   Plan: COntinue tube feedings until MN  ID  Pneumonia (hospital acquired (not ventilator-associated) Acinetobacter PNA)   Plan: CPM  HEME  Anemia acute blood loss anemia and anemia of critical illness)   Plan: No blood transfusion necessary  ENDO No specific issues   Plan: CPM  Global Issues  Patient doing okay.  New Acinetobacter PNA identified.  Will switch antibiotic to Levaquin.  Try to wean  to trach collar.      LOS: 17 days   Additional comments:I reviewed the patient's new clinical lab test results. cbc/bmet and I reviewed the patients new imaging test results. cxr  Critical Care Total Time*: 30 Minutes  Theus Espin 08/16/2015  *Care during the described time interval was provided by me and/or other providers on the critical care team.  I have reviewed this patient's available data, including medical history, events of note, physical examination and test results as part of my evaluation.

## 2015-08-16 NOTE — Progress Notes (Addendum)
Pharmacy Antibiotic Note  William NordmannDonald Wayne Logan is a 50 y.o. male admitted on 07/30/2015 with acinetobacter pneumonia.  Pharmacy has been consulted for levofloxacin dosing. Tmax 100.7, WBC WNL. CrCl>100  Plan: Levo 750mg  IV q24h Monitor clinical progress, c/s, renal function, abx plan/LOT   Height: 5\' 11"  (180.3 cm) Weight: 217 lb 13 oz (98.8 kg) IBW/kg (Calculated) : 75.3  Temp (24hrs), Avg:99.9 F (37.7 C), Min:99 F (37.2 C), Max:100.7 F (38.2 C)   Recent Labs Lab 08/11/15 0515 08/12/15 0500 08/13/15 0512 08/15/15 0520 08/16/15 0540  WBC 11.3* 10.7* 10.7* 11.1* 9.7  CREATININE 0.71 0.72 0.69 0.51* 0.59*    Estimated Creatinine Clearance: 133.8 mL/min (by C-G formula based on Cr of 0.59).    No Known Allergies  Antimicrobials this admission: Ancef 4/10>>4/14 Vanc 4/14>>4/16 Cefepime 4/14>> 4/23 Levo 4/26 >>  Dose adjustments this admission: n/a  Microbiology results: 4/23 TA: acinetobacter (pan-S) 4/20 UC: neg 4/12 BCx: ngf 4/12 urine: neg 4/12 TA: h. Flu beta-lactamase (NOT esbl) 4/9 MRSA - NEG   Babs BertinHaley Nyeisha Logan, PharmD, BCPS Clinical Pharmacist Pager [phone] 08/16/2015 9:55 AM

## 2015-08-17 ENCOUNTER — Encounter (HOSPITAL_COMMUNITY): Payer: Self-pay | Admitting: General Surgery

## 2015-08-17 ENCOUNTER — Inpatient Hospital Stay (HOSPITAL_COMMUNITY): Payer: BLUE CROSS/BLUE SHIELD

## 2015-08-17 ENCOUNTER — Encounter (HOSPITAL_COMMUNITY): Admission: EM | Disposition: A | Discharge status: Skilled Nursing Facility | Payer: Self-pay | Source: Home / Self Care

## 2015-08-17 HISTORY — PX: PEG PLACEMENT: SHX5437

## 2015-08-17 LAB — CBC WITH DIFFERENTIAL/PLATELET
Basophils Absolute: 0 10*3/uL (ref 0.0–0.1)
Basophils Relative: 0 %
EOS ABS: 0.3 10*3/uL (ref 0.0–0.7)
Eosinophils Relative: 4 %
HEMATOCRIT: 26.3 % — AB (ref 39.0–52.0)
HEMOGLOBIN: 8 g/dL — AB (ref 13.0–17.0)
LYMPHS ABS: 1.5 10*3/uL (ref 0.7–4.0)
LYMPHS PCT: 19 %
MCH: 30.4 pg (ref 26.0–34.0)
MCHC: 30.4 g/dL (ref 30.0–36.0)
MCV: 100 fL (ref 78.0–100.0)
MONOS PCT: 9 %
Monocytes Absolute: 0.7 10*3/uL (ref 0.1–1.0)
NEUTROS PCT: 68 %
Neutro Abs: 5.4 10*3/uL (ref 1.7–7.7)
Platelets: 352 10*3/uL (ref 150–400)
RBC: 2.63 MIL/uL — ABNORMAL LOW (ref 4.22–5.81)
RDW: 15.9 % — ABNORMAL HIGH (ref 11.5–15.5)
WBC: 7.9 10*3/uL (ref 4.0–10.5)

## 2015-08-17 LAB — BASIC METABOLIC PANEL
Anion gap: 8 (ref 5–15)
BUN: 19 mg/dL (ref 6–20)
CHLORIDE: 109 mmol/L (ref 101–111)
CO2: 25 mmol/L (ref 22–32)
CREATININE: 0.53 mg/dL — AB (ref 0.61–1.24)
Calcium: 8.3 mg/dL — ABNORMAL LOW (ref 8.9–10.3)
GFR calc Af Amer: >60 mL/min (ref >60)
GFR calc non Af Amer: >60 mL/min (ref >60)
GLUCOSE: 128 mg/dL — AB (ref 65–99)
Potassium: 3.9 mmol/L (ref 3.5–5.1)
SODIUM: 142 mmol/L (ref 135–145)

## 2015-08-17 SURGERY — INSERTION, PEG TUBE
Anesthesia: Moderate Sedation

## 2015-08-17 MED ORDER — VECURONIUM BROMIDE 10 MG IV SOLR
INTRAVENOUS | Status: AC
  Filled 2015-08-17: qty 10

## 2015-08-17 MED ORDER — ALTEPLASE 2 MG IJ SOLR
2.0000 mg | Freq: Once | INTRAMUSCULAR | Status: AC
  Administered 2015-08-17: 2 mg
  Filled 2015-08-17: qty 2

## 2015-08-17 MED ORDER — VECURONIUM BROMIDE 10 MG IV SOLR
10.0000 mg | Freq: Once | INTRAVENOUS | Status: AC
  Administered 2015-08-17: 10 mg via INTRAVENOUS

## 2015-08-17 MED ORDER — FUROSEMIDE 10 MG/ML IJ SOLN
10.0000 mg | Freq: Once | INTRAMUSCULAR | Status: AC
  Administered 2015-08-17: 10 mg via INTRAVENOUS
  Filled 2015-08-17: qty 2
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
## 2015-08-17 NOTE — Progress Notes (Signed)
[facility] per MD order.    Quintella BatonJulie W. Amunique Neyra, RN, BSN  Trauma/Neuro ICU Case Manager [phone]

## 2015-08-17 NOTE — Progress Notes (Signed)
OT Cancellation Note  Patient Details Name: Julien NordmannDonald Wayne Tolen MRN: [id number] DOB: 06-04-1965   Cancelled Treatment:    Reason Eval/Treat Not Completed: Patient at procedure or test/ unavailable  [facility] MemorialWARD,HILLARY  Jaydian Santana, OTR/L  [phone] 08/17/2015 08/17/2015, 4:02 PM

## 2015-08-17 NOTE — Progress Notes (Signed)
Pt's right ear debrided and sutures removed.  He may follow up with me as an outpatient in 2-3 weeks.
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
## 2015-08-18 DIAGNOSIS — J9601 Acute respiratory failure with hypoxia: Secondary | ICD-10-CM

## 2015-08-18 DIAGNOSIS — A498 Other bacterial infections of unspecified site: Secondary | ICD-10-CM

## 2015-08-18 LAB — BASIC METABOLIC PANEL
Anion gap: 9 (ref 5–15)
BUN: 14 mg/dL (ref 6–20)
CALCIUM: 8.3 mg/dL — AB (ref 8.9–10.3)
CO2: 24 mmol/L (ref 22–32)
Chloride: 105 mmol/L (ref 101–111)
Creatinine, Ser: 0.56 mg/dL — ABNORMAL LOW (ref 0.61–1.24)
GFR calc Af Amer: >60 mL/min (ref >60)
GLUCOSE: 127 mg/dL — AB (ref 65–99)
Potassium: 4 mmol/L (ref 3.5–5.1)
Sodium: 138 mmol/L (ref 135–145)

## 2015-08-18 LAB — CBC
HEMATOCRIT: 26.8 % — AB (ref 39.0–52.0)
Hemoglobin: 8.1 g/dL — ABNORMAL LOW (ref 13.0–17.0)
MCH: 30 pg (ref 26.0–34.0)
MCHC: 30.2 g/dL (ref 30.0–36.0)
MCV: 99.3 fL (ref 78.0–100.0)
PLATELETS: 330 10*3/uL (ref 150–400)
RBC: 2.7 MIL/uL — ABNORMAL LOW (ref 4.22–5.81)
RDW: 15.6 % — AB (ref 11.5–15.5)
WBC: 7.1 10*3/uL (ref 4.0–10.5)

## 2015-08-18 MED ORDER — QUETIAPINE FUMARATE 100 MG PO TABS
100.0000 mg | ORAL_TABLET | Freq: Three times a day (TID) | ORAL | Status: DC
  Administered 2015-08-18 – 2015-08-24 (×19): 100 mg
  Filled 2015-08-18 (×19): qty 1

## 2015-08-18 MED ORDER — DIAZEPAM 1 MG/ML PO SOLN
10.0000 mg | Freq: Three times a day (TID) | ORAL | Status: DC
  Administered 2015-08-18 – 2015-08-21 (×8): 10 mg
  Filled 2015-08-18 (×8): qty 10
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
## 2015-08-19 ENCOUNTER — Inpatient Hospital Stay (HOSPITAL_COMMUNITY): Payer: BLUE CROSS/BLUE SHIELD

## 2015-08-19 ENCOUNTER — Encounter (HOSPITAL_COMMUNITY): Payer: Self-pay | Admitting: General Surgery

## 2015-08-19 DIAGNOSIS — A498 Other bacterial infections of unspecified site: Secondary | ICD-10-CM

## 2015-08-19 DIAGNOSIS — F1010 Alcohol abuse, uncomplicated: Secondary | ICD-10-CM

## 2015-08-19 DIAGNOSIS — J189 Pneumonia, unspecified organism: Secondary | ICD-10-CM
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
## 2015-08-19 NOTE — Progress Notes (Signed)
Patient ID: William NordmannDonald Wayne Logan, male   DOB: 04/06/1966, 50 y.o.   MRN: [id number] BP 102/72 mmHg  Pulse 93  Temp(Src) 100.3 F (37.9 C) (Axillary)  Resp 20  Ht 5\' 11"  (1.803 m)  Wt 97.8 kg (215 lb 9.8 oz)  BMI 30.08 kg/m2  SpO2 97% Currently sedated, by report today will follow commands No neurologic changes
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
## 2015-08-20 DIAGNOSIS — J810 Acute pulmonary edema: Secondary | ICD-10-CM

## 2015-08-20 LAB — C DIFFICILE QUICK SCREEN W PCR REFLEX
C DIFFICILE (CDIFF) TOXIN: NEGATIVE
C DIFFICLE (CDIFF) ANTIGEN: NEGATIVE
C Diff interpretation: NEGATIVE

## 2015-08-20 LAB — CBC
HCT: 27.6 % — ABNORMAL LOW (ref 39.0–52.0)
HEMOGLOBIN: 8.5 g/dL — AB (ref 13.0–17.0)
MCH: 30.7 pg (ref 26.0–34.0)
MCHC: 30.8 g/dL (ref 30.0–36.0)
MCV: 99.6 fL (ref 78.0–100.0)
PLATELETS: 273 10*3/uL (ref 150–400)
RBC: 2.77 MIL/uL — ABNORMAL LOW (ref 4.22–5.81)
RDW: 15.1 % (ref 11.5–15.5)
WBC: 6.6 10*3/uL (ref 4.0–10.5)

## 2015-08-20 MED ORDER — FREE WATER
300.0000 mL | Freq: Two times a day (BID) | Status: DC
  Administered 2015-08-20 – 2015-09-14 (×49): 300 mL

## 2015-08-20 MED ORDER — METOPROLOL TARTRATE 25 MG/10 ML ORAL SUSPENSION
25.0000 mg | Freq: Two times a day (BID) | ORAL | Status: DC
  Administered 2015-08-20 – 2015-09-06 (×34): 25 mg
  Filled 2015-08-20 (×35): qty 10

## 2015-08-20 MED ORDER — FUROSEMIDE 10 MG/ML IJ SOLN
40.0000 mg | Freq: Four times a day (QID) | INTRAMUSCULAR | Status: AC
  Administered 2015-08-20 (×2): 40 mg via INTRAVENOUS
  Filled 2015-08-20 (×2): qty 4

## 2015-08-20 MED ORDER — TAMSULOSIN HCL 0.4 MG PO CAPS
0.4000 mg | ORAL_CAPSULE | Freq: Every day | ORAL | Status: DC
  Administered 2015-08-20 – 2015-08-21 (×2): 0.4 mg via ORAL
  Filled 2015-08-20 (×2): qty 1

## 2015-08-20 MED ORDER — IPRATROPIUM-ALBUTEROL 0.5-2.5 (3) MG/3ML IN SOLN
3.0000 mL | Freq: Three times a day (TID) | RESPIRATORY_TRACT | Status: DC
  Administered 2015-08-20 – 2015-09-01 (×38): 3 mL via RESPIRATORY_TRACT
  Filled 2015-08-20 (×38): qty 3
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
## 2015-08-20 NOTE — Progress Notes (Signed)
eLink Physician-Brief Progress Note Patient Name: William NordmannDonald Wayne Logan DOB: [birth year] MRN: [id number]   Date of Service  08/20/2015  HPI/Events of Note  Oliguria fixed with foley: 1650cc out immediately  eICU Interventions  Start flomax     Intervention Category Intermediate Interventions: Oliguria - evaluation and management  Max FickleDouglas McQuaid 08/20/2015, 4:48 PM

## 2015-08-20 NOTE — Progress Notes (Signed)
Patient ID: [name], male   DOB: 09-13-1965, 50 y.o.   MRN: [id number] BP 91/54 mmHg  Pulse 105  Temp(Src) 99.5 F (37.5 C) (Axillary)  Resp 23  Ht 5\' 11"  (1.803 m)  Wt 98.9 kg (218 lb 0.6 oz)  BMI 30.42 kg/m2  SpO2 97% Does not follow commands Perrl Moving right side, upper extremity greater than lower extremity Perrl,  No change

## 2015-08-20 NOTE — Progress Notes (Signed)
Trauma Service Note  Subjective: Febrile overnight, low BP when gets metoprolol, valium, and seroquel at one time  Objective: Vital signs in last 24 hours: Temp:  [99.4 F (37.4 C)-101 F (38.3 C)] 101 F (38.3 C) (04/30 0730) Pulse Rate:  [80-114] 80 (04/30 0859) Resp:  [18-48] 26 (04/30 0859) BP: (82-119)/(54-87) 119/80 mmHg (04/30 0859) SpO2:  [94 %-100 %] 98 % (04/30 0859) FiO2 (%):  [30 %-35 %] 35 % (04/30 0859) Weight:  [98.9 kg (218 lb 0.6 oz)] 98.9 kg (218 lb 0.6 oz) (04/30 0411) Last BM Date: 08/19/15  Intake/Output from previous day: 04/29 0701 - 04/30 0700 In: 3051.1 [I.V.:2431.1; NG/GT:520; IV Piggyback:100] Out: 2050 [Urine:2050] Intake/Output this shift: Total I/O In: 215 [I.V.:215] Out: -   General: NAD  Lungs: CTAB  Abd: soft, NT, ND  Extremities: no edema  Neuro: follows commands with right upper extremity, no movement other 3 extremities  Lab Results: CBC   Recent Labs  08/18/15 0425  WBC 7.1  HGB 8.1*  HCT 26.8*  PLT 330   BMET  Recent Labs  08/18/15 0425  NA 138  K 4.0  CL 105  CO2 24  GLUCOSE 127*  BUN 14  CREATININE 0.56*  CALCIUM 8.3*   PT/INR No results for input(s): LABPROT, INR in the last 72 hours. ABG No results for input(s): PHART, HCO3 in the last 72 hours.  Invalid input(s): PCO2, PO2  Studies/Results: No results found.  Anti-infectives: Anti-infectives    Start     Dose/Rate Route Frequency Ordered Stop   08/16/15 1000  levofloxacin (LEVAQUIN) IVPB 750 mg     750 mg 100 mL/hr over 90 Minutes Intravenous Every 24 hours 08/16/15 0953     08/15/15 1200  ceFAZolin (ANCEF) IVPB 2g/100 mL premix  Status:  Discontinued     2 g 200 mL/hr over 30 Minutes Intravenous  Once 08/15/15 0858 08/16/15 0944   08/14/15 1200  ceFAZolin (ANCEF) IVPB 2g/100 mL premix  Status:  Discontinued     2 g 200 mL/hr over 30 Minutes Intravenous On call to O.R. 08/13/15 5784 08/15/15 0858   08/04/15 1800  vancomycin (VANCOCIN) IVPB  1000 mg/200 mL premix  Status:  Discontinued     1,000 mg 200 mL/hr over 60 Minutes Intravenous Every 8 hours 08/04/15 0906 08/06/15 0817   08/04/15 1000  ceFEPIme (MAXIPIME) 2 g in dextrose 5 % 50 mL IVPB     2 g 100 mL/hr over 30 Minutes Intravenous Every 8 hours 08/04/15 0906 08/13/15 0231   08/04/15 1000  vancomycin (VANCOCIN) 2,000 mg in sodium chloride 0.9 % 500 mL IVPB     2,000 mg 250 mL/hr over 120 Minutes Intravenous  Once 08/04/15 0906 08/04/15 1446   07/31/15 1930  ceFAZolin (ANCEF) IVPB 2g/100 mL premix  Status:  Discontinued     2 g 200 mL/hr over 30 Minutes Intravenous 3 times per day 07/31/15 1923 08/04/15 0858   07/31/15 1715  ceFAZolin (ANCEF) 2-4 GM/100ML-% IVPB  Status:  Discontinued    Comments:  Key, Jennifer   : cabinet override      07/31/15 1715 07/31/15 1923   07/31/15 1436  vancomycin (VANCOCIN) powder  Status:  Discontinued       As needed 07/31/15 1436 07/31/15 1844   07/31/15 1300  bacitracin 50,000 Units in sodium chloride irrigation 0.9 % 500 mL irrigation  Status:  Discontinued       As needed 07/31/15 1353 07/31/15 1844   07/31/15 1240  vancomycin (VANCOCIN) 1000 MG powder  Status:  Discontinued    Comments:  Estevan Oaks, Esther   : cabinet override      07/31/15 1240 07/31/15 1248   07/31/15 1231  vancomycin (VANCOCIN) 1000 MG powder    Comments:  Loreli Dollar   : cabinet override      07/31/15 1231 08/01/15 0044      Medications Scheduled Meds: . amantadine  150 mg Per Tube BID  . antiseptic oral rinse  7 mL Mouth Rinse 10 times per day  . chlorhexidine gluconate (SAGE KIT)  15 mL Mouth Rinse BID  . diazepam  10 mg Per Tube Q8H  . docusate  100 mg Oral BID  . enoxaparin (LOVENOX) injection  30 mg Subcutaneous Q12H  . feeding supplement (PRO-STAT SUGAR FREE 64)  30 mL Oral Daily  . free water  300 mL Per Tube Q6H  . furosemide  20 mg Per Tube BID  . ipratropium-albuterol  3 mL Nebulization TID  . levofloxacin (LEVAQUIN) IV  750 mg Intravenous  Q24H  . metoprolol tartrate  50 mg Per Tube BID  . pantoprazole  40 mg Oral Daily   Or  . pantoprazole (PROTONIX) IV  40 mg Intravenous Daily  . QUEtiapine  100 mg Per Tube TID  . sodium chloride flush  10-40 mL Intracatheter Q12H   Continuous Infusions: . dexmedetomidine 1 mcg/kg/hr (08/20/15 0830)  . dextrose 5 % and 0.45 % NaCl with KCl 10 mEq/L 75 mL/hr at 08/20/15 0411  . feeding supplement (PIVOT 1.5 CAL) 1,000 mL (08/19/15 2213)   PRN Meds:.acetaminophen (TYLENOL) oral liquid 160 mg/5 mL, bisacodyl, fentaNYL (SUBLIMAZE) injection, LORazepam, menthol-cetylpyridinium **OR** phenol, ondansetron **OR** ondansetron (ZOFRAN) IV, sodium chloride flush  Assessment/Plan: s/p Procedure(s): PERCUTANEOUS ENDOSCOPIC GASTROSTOMY (PEG) PLACEMENT L orbit and zygoma FXs - per Dr. Benjamine Mola C6 facet widening - collar per Dr. Saintclair Halsted R rib FXs [phone] with trace R PTX; L rib FXs 5-12 Spinous process FXs T4,9,10,12; L 1-5 R L 1-5 TVP fxs L1 burst FX/3 column injury s/p fusion - per Dr. Saintclair Halsted Grade 1 spleen lac  -kvo fluids -continue tube feeds -halve metoprolol and change schedule -f/u CBC for new fever -continue lovenox   LOS: 21 days   [place] Trauma Surgeon [phone] Surgery 08/20/2015

## 2015-08-21 ENCOUNTER — Inpatient Hospital Stay (HOSPITAL_COMMUNITY): Payer: BLUE CROSS/BLUE SHIELD

## 2015-08-21 HISTORY — PX: TRACHEOSTOMY CLOSURE: SHX458

## 2015-08-21 LAB — BASIC METABOLIC PANEL
ANION GAP: 8 (ref 5–15)
BUN: 20 mg/dL (ref 6–20)
CALCIUM: 7.8 mg/dL — AB (ref 8.9–10.3)
CHLORIDE: 104 mmol/L (ref 101–111)
CO2: 27 mmol/L (ref 22–32)
CREATININE: 0.52 mg/dL — AB (ref 0.61–1.24)
GFR calc non Af Amer: >60 mL/min (ref >60)
Glucose, Bld: 296 mg/dL — ABNORMAL HIGH (ref 65–99)
Potassium: 4.2 mmol/L (ref 3.5–5.1)
SODIUM: 139 mmol/L (ref 135–145)

## 2015-08-21 LAB — CBC WITH DIFFERENTIAL/PLATELET
Basophils Absolute: 0 K/uL (ref 0.0–0.1)
Basophils Relative: 0 %
Eosinophils Absolute: 0.2 K/uL (ref 0.0–0.7)
Eosinophils Relative: 3 %
HCT: 27.1 % — ABNORMAL LOW (ref 39.0–52.0)
Hemoglobin: 8.4 g/dL — ABNORMAL LOW (ref 13.0–17.0)
Lymphocytes Relative: 19 %
Lymphs Abs: 1.4 K/uL (ref 0.7–4.0)
MCH: 31 pg (ref 26.0–34.0)
MCHC: 31 g/dL (ref 30.0–36.0)
MCV: 100 fL (ref 78.0–100.0)
Monocytes Absolute: 0.8 K/uL (ref 0.1–1.0)
Monocytes Relative: 11 %
Neutro Abs: 5 K/uL (ref 1.7–7.7)
Neutrophils Relative %: 67 %
Platelets: 261 K/uL (ref 150–400)
RBC: 2.71 MIL/uL — ABNORMAL LOW (ref 4.22–5.81)
RDW: 15.3 % (ref 11.5–15.5)
WBC: 7.5 K/uL (ref 4.0–10.5)

## 2015-08-21 LAB — GLUCOSE, CAPILLARY: GLUCOSE-CAPILLARY: 105 mg/dL — AB (ref 65–99)

## 2015-08-21 MED ORDER — THIAMINE HCL 100 MG/ML IJ SOLN
100.0000 mg | Freq: Every day | INTRAMUSCULAR | Status: AC
  Administered 2015-08-21 – 2015-08-23 (×3): 100 mg via INTRAVENOUS
  Filled 2015-08-21 (×3): qty 2

## 2015-08-21 MED ORDER — PANTOPRAZOLE SODIUM 40 MG IV SOLR
40.0000 mg | Freq: Every day | INTRAVENOUS | Status: DC
  Filled 2015-08-21: qty 40

## 2015-08-21 MED ORDER — CLONAZEPAM 0.1 MG/ML ORAL SUSPENSION
2.0000 mg | Freq: Three times a day (TID) | ORAL | Status: DC
  Filled 2015-08-21: qty 20

## 2015-08-21 MED ORDER — CHLORHEXIDINE GLUCONATE 0.12 % MT SOLN
OROMUCOSAL | Status: AC
  Filled 2015-08-21: qty 15

## 2015-08-21 MED ORDER — PANTOPRAZOLE SODIUM 40 MG PO PACK
40.0000 mg | PACK | Freq: Every day | ORAL | Status: DC
  Administered 2015-08-21 – 2015-09-20 (×31): 40 mg
  Filled 2015-08-21 (×30): qty 20

## 2015-08-21 MED ORDER — FOLIC ACID 1 MG PO TABS
1.0000 mg | ORAL_TABLET | Freq: Every day | ORAL | Status: DC
  Administered 2015-08-21 – 2015-09-20 (×31): 1 mg
  Filled 2015-08-21 (×31): qty 1

## 2015-08-21 MED ORDER — VITAMIN B-1 100 MG PO TABS
100.0000 mg | ORAL_TABLET | Freq: Every day | ORAL | Status: DC
  Administered 2015-08-24 – 2015-09-20 (×28): 100 mg
  Filled 2015-08-21 (×29): qty 1

## 2015-08-21 MED ORDER — CLONAZEPAM 0.5 MG PO TBDP
2.0000 mg | ORAL_TABLET | Freq: Three times a day (TID) | ORAL | Status: DC
  Administered 2015-08-21 (×3): 2 mg
  Filled 2015-08-21 (×3): qty 4
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
## 2015-08-21 NOTE — Progress Notes (Signed)
Inpatient Diabetes Program Recommendations  AACE/ADA: New Consensus Statement on Inpatient Glycemic Control (2015)  Target Ranges:  Prepandial:   less than 140 mg/dL      Peak postprandial:   less than 180 mg/dL (1-2 hours)      Critically ill patients:  140 - 180 mg/dL   Review of Glycemic Control  Diabetes history: none noted  Inpatient Diabetes Program Recommendations:   Consider monitoring CBGs q4 while on tube feedings.  Thank you  Piedad ClimesGina Satoshi Kalas BSN, RN,CDE Inpatient Diabetes Coordinator [phone] (team pager)

## 2015-08-21 NOTE — Progress Notes (Signed)
Patient's O2 sat dropped to 87% and Respiratory rate was in high 40s. RT placed patient back on ventilator for tonight. Patient is now resting comfortably with O2 sat 94% on Fio2 30%. RT will continue to monitor.
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
## 2015-08-21 NOTE — Progress Notes (Signed)
Follow up - Trauma and Critical Care  Patient Details:    William NordmannDonald Wayne Logan is an 50 y.o. male.  Lines/tubes : PICC Triple Lumen 07/30/15 PICC Right Basilic 40 cm 0 cm (Active)  Indication for Insertion or Continuance of Line Prolonged intravenous therapies 08/21/2015  7:30 AM  Exposed Catheter (cm) 0 cm 08/17/2015  2:47 PM  Site Assessment Clean;Dry;Intact 08/20/2015  8:00 PM  Lumen #1 Status Infusing;Flushed;Blood return noted 08/20/2015  8:00 PM  Lumen #2 Status Infusing;Flushed;Blood return noted 08/20/2015  8:00 PM  Lumen #3 Status Infusing;Flushed;Blood return noted 08/20/2015  8:00 PM  Dressing Type Transparent 08/20/2015  8:00 PM  Dressing Status Clean;Dry;Intact 08/20/2015  8:00 PM  Line Care Connections checked and tightened 08/20/2015  8:00 PM  Line Adjustment (NICU/IV Team Only) No 07/30/2015 11:25 AM  Dressing Intervention Antimicrobial disc changed;Dressing changed;New dressing 08/20/2015  6:00 AM  Dressing Change Due 08/27/15 08/20/2015  8:00 PM     Gastrostomy/Enterostomy Percutaneous endoscopic gastrostomy (PEG) 24 Fr. LUQ (Active)  Surrounding Skin Dry;Intact 08/20/2015  8:00 PM  Tube Status Patent 08/20/2015  8:00 PM  Drainage Appearance Bile 08/18/2015  8:00 AM  Dressing Status Clean;Dry;Intact 08/19/2015  8:00 PM  Dressing Type Split gauze;Abdominal Binder 08/19/2015  8:00 PM  Output (mL) 200 mL 08/18/2015  6:00 AM     Urethral Catheter J.Daleen Squibbhomasson, RN 14 Fr. (Active)  Indication for Insertion or Continuance of Catheter Acute urinary retention 08/21/2015  7:29 AM  Site Assessment Intact;Clean 08/20/2015  8:00 PM  Catheter Maintenance Bag below level of bladder;Catheter secured;Drainage bag/tubing not touching floor;Insertion date on drainage bag;No dependent loops;Seal intact 08/21/2015  7:30 AM  Output (mL) 100 mL 08/21/2015  7:30 AM    Microbiology/Sepsis markers: Results for orders placed or performed during the hospital encounter of 07/30/15  MRSA PCR Screening     Status:  None   Collection Time: 07/30/15  1:48 PM  Result Value Ref Range Status   MRSA by PCR NEGATIVE NEGATIVE Final    Comment:        The GeneXpert MRSA Assay (FDA approved for NASAL specimens only), is one component of a comprehensive MRSA colonization surveillance program. It is not intended to diagnose MRSA infection nor to guide or monitor treatment for MRSA infections.   Culture, Urine     Status: None   Collection Time: 08/02/15 11:00 AM  Result Value Ref Range Status   Specimen Description URINE, CATHETERIZED  Final   Special Requests NONE  Final   Culture NO GROWTH 1 DAY  Final   Report Status 08/03/2015 FINAL  Final  Culture, respiratory (NON-Expectorated)     Status: None   Collection Time: 08/02/15 11:47 AM  Result Value Ref Range Status   Specimen Description TRACHEAL ASPIRATE  Final   Special Requests NONE  Final   Gram Stain   Final    ABUNDANT WBC PRESENT, PREDOMINANTLY PMN NO SQUAMOUS EPITHELIAL CELLS SEEN ABUNDANT GRAM NEGATIVE COCCOBACILLI Performed at [facility]    Culture   Final    ABUNDANT HAEMOPHILUS INFLUENZAE Note: BETA LACTAMASE POSITIVE Performed at [facility]    Report Status 08/04/2015 FINAL  Final  Culture, blood (Routine X 2) w Reflex to ID Panel     Status: None   Collection Time: 08/02/15 12:20 PM  Result Value Ref Range Status   Specimen Description BLOOD LEFT ANTECUBITAL  Final   Special Requests   Final    BOTTLES DRAWN AEROBIC AND ANAEROBIC 7CC AER 3CC ANA  Culture NO GROWTH 5 DAYS  Final   Report Status 08/07/2015 FINAL  Final  Culture, blood (Routine X 2) w Reflex to ID Panel     Status: None   Collection Time: 08/02/15 12:35 PM  Result Value Ref Range Status   Specimen Description BLOOD BLOOD LEFT WRIST  Final   Special Requests BOTTLES DRAWN AEROBIC ONLY 5CC  Final   Culture NO GROWTH 5 DAYS  Final   Report Status 08/07/2015 FINAL  Final  Culture, Urine     Status: None   Collection Time: 08/10/15 10:16  AM  Result Value Ref Range Status   Specimen Description URINE, CATHETERIZED  Final   Special Requests Normal  Final   Culture NO GROWTH 1 DAY  Final   Report Status 08/11/2015 FINAL  Final  Culture, respiratory (NON-Expectorated)     Status: None   Collection Time: 08/13/15  9:58 AM  Result Value Ref Range Status   Specimen Description TRACHEAL ASPIRATE  Final   Special Requests Normal  Final   Gram Stain   Final    ABUNDANT WBC PRESENT, PREDOMINANTLY PMN RARE SQUAMOUS EPITHELIAL CELLS PRESENT FEW GRAM POSITIVE COCCI IN PAIRS IN CLUSTERS RARE GRAM NEGATIVE RODS Performed at [facility]    Culture   Final    FEW ACINETOBACTER CALCOACETICUS/BAUMANNII COMPLEX Performed at [facility]    Report Status 08/16/2015 FINAL  Final   Organism ID, Bacteria ACINETOBACTER CALCOACETICUS/BAUMANNII COMPLEX  Final      Susceptibility   Acinetobacter calcoaceticus/baumannii complex - MIC*    CEFTAZIDIME 4 SENSITIVE Sensitive     CIPROFLOXACIN <=0.25 SENSITIVE Sensitive     GENTAMICIN 2 SENSITIVE Sensitive     IMIPENEM <=0.25 SENSITIVE Sensitive     PIP/TAZO <=4 SENSITIVE Sensitive     TOBRAMYCIN <=1 SENSITIVE Sensitive     * FEW ACINETOBACTER CALCOACETICUS/BAUMANNII COMPLEX  C difficile quick screen w PCR reflex     Status: None   Collection Time: 08/20/15  6:45 PM  Result Value Ref Range Status   C Diff antigen NEGATIVE NEGATIVE Final   C Diff toxin NEGATIVE NEGATIVE Final   C Diff interpretation Negative for toxigenic C. difficile  Final    Anti-infectives:  Anti-infectives    Start     Dose/Rate Route Frequency Ordered Stop   08/16/15 1000  levofloxacin (LEVAQUIN) IVPB 750 mg     750 mg 100 mL/hr over 90 Minutes Intravenous Every 24 hours 08/16/15 0953     08/15/15 1200  ceFAZolin (ANCEF) IVPB 2g/100 mL premix  Status:  Discontinued     2 g 200 mL/hr over 30 Minutes Intravenous  Once 08/15/15 0858 08/16/15 0944   08/14/15 1200  ceFAZolin (ANCEF) IVPB 2g/100 mL  premix  Status:  Discontinued     2 g 200 mL/hr over 30 Minutes Intravenous On call to O.R. 08/13/15 1610 08/15/15 0858   08/04/15 1800  vancomycin (VANCOCIN) IVPB 1000 mg/200 mL premix  Status:  Discontinued     1,000 mg 200 mL/hr over 60 Minutes Intravenous Every 8 hours 08/04/15 0906 08/06/15 0817   08/04/15 1000  ceFEPIme (MAXIPIME) 2 g in dextrose 5 % 50 mL IVPB     2 g 100 mL/hr over 30 Minutes Intravenous Every 8 hours 08/04/15 0906 08/13/15 0231   08/04/15 1000  vancomycin (VANCOCIN) 2,000 mg in sodium chloride 0.9 % 500 mL IVPB     2,000 mg 250 mL/hr over 120 Minutes Intravenous  Once 08/04/15 0906 08/04/15 1446  07/31/15 1930  ceFAZolin (ANCEF) IVPB 2g/100 mL premix  Status:  Discontinued     2 g 200 mL/hr over 30 Minutes Intravenous 3 times per day 07/31/15 1923 08/04/15 0858   07/31/15 1715  ceFAZolin (ANCEF) 2-4 GM/100ML-% IVPB  Status:  Discontinued    Comments:  Key, Jennifer   : cabinet override      07/31/15 1715 07/31/15 1923   07/31/15 1436  vancomycin (VANCOCIN) powder  Status:  Discontinued       As needed 07/31/15 1436 07/31/15 1844   07/31/15 1300  bacitracin 50,000 Units in sodium chloride irrigation 0.9 % 500 mL irrigation  Status:  Discontinued       As needed 07/31/15 1353 07/31/15 1844   07/31/15 1240  vancomycin (VANCOCIN) 1000 MG powder  Status:  Discontinued    Comments:  Ratcliff, Esther   : cabinet override      07/31/15 1240 07/31/15 1248   07/31/15 1231  vancomycin (VANCOCIN) 1000 MG powder    Comments:  Dorinda Hill   : cabinet override      07/31/15 1231 08/01/15 0044      Best Practice/Protocols:  VTE Prophylaxis: Lovenox (prophylaxtic dose) and Mechanical GI Prophylaxis: Proton Pump Inhibitor Continous Sedation Precedex  Consults: Treatment Team:  Newman Pies, MD Donalee Citrin, MD Durene Romans, MD    Events:  Subjective:    Overnight Issues: Patient did wean for a while yesterday.  Coughing a lot today.  Objective:  Vital signs for  last 24 hours: Temp:  [98.7 F (37.1 C)-102.2 F (39 C)] 98.7 F (37.1 C) (05/01 0400) Pulse Rate:  [78-113] 85 (05/01 0800) Resp:  [16-45] 19 (05/01 0800) BP: (91-130)/(54-80) 99/55 mmHg (05/01 0800) SpO2:  [94 %-100 %] 96 % (05/01 0800) FiO2 (%):  [30 %-40 %] 30 % (05/01 0800) Weight:  [93.5 kg (206 lb 2.1 oz)] 93.5 kg (206 lb 2.1 oz) (05/01 0403)  Hemodynamic parameters for last 24 hours: CVP:  [7 mmHg-43 mmHg] 11 mmHg  Intake/Output from previous day: 04/30 0701 - 05/01 0700 In: 4203.8 [I.V.:2258.8; NG/GT:1795; IV Piggyback:150] Out: 3580 [Urine:3580]  Intake/Output this shift: Total I/O In: 94.5 [I.V.:94.5] Out: 100 [Urine:100]  Vent settings for last 24 hours: Vent Mode:  [-] PRVC FiO2 (%):  [30 %-40 %] 30 % Set Rate:  [18 bmp] 18 bmp Vt Set:  [604 mL] 620 mL PEEP:  [5 cmH20] 5 cmH20 Plateau Pressure:  [18 cmH20-22 cmH20] 20 cmH20  Physical Exam:  General: alert, no respiratory distress and following some commands  Neuro: alert, oriented, nonfocal exam, RASS 0, weakness right lower extremity and weakness left lower extremity HEENT/Neck: trach-clean, intact Resp: clear to auscultation bilaterally CVS: regular rate and rhythm, S1, S2 normal, no murmur, click, rub or gallop GI: soft, nontender, BS WNL, no r/g and tolerating tube feedings well Extremities: no edema, no erythema, pulses WNL  Results for orders placed or performed during the hospital encounter of 07/30/15 (from the past 24 hour(s))  CBC     Status: Abnormal   Collection Time: 08/20/15 11:31 AM  Result Value Ref Range   WBC 6.6 4.0 - 10.5 K/uL   RBC 2.77 (L) 4.22 - 5.81 MIL/uL   Hemoglobin 8.5 (L) 13.0 - 17.0 g/dL   HCT 54.0 (L) 98.1 - 19.1 %   MCV 99.6 78.0 - 100.0 fL   MCH 30.7 26.0 - 34.0 pg   MCHC 30.8 30.0 - 36.0 g/dL   RDW 47.8 29.5 - 62.1 %  Platelets 273 150 - 400 K/uL  C difficile quick screen w PCR reflex     Status: None   Collection Time: 08/20/15  6:45 PM  Result Value Ref Range    C Diff antigen NEGATIVE NEGATIVE   C Diff toxin NEGATIVE NEGATIVE   C Diff interpretation Negative for toxigenic C. difficile   Basic metabolic panel     Status: Abnormal   Collection Time: 08/21/15  5:45 AM  Result Value Ref Range   Sodium 139 135 - 145 mmol/L   Potassium 4.2 3.5 - 5.1 mmol/L   Chloride 104 101 - 111 mmol/L   CO2 27 22 - 32 mmol/L   Glucose, Bld 296 (H) 65 - 99 mg/dL   BUN 20 6 - 20 mg/dL   Creatinine, Ser 1.61 (L) 0.61 - 1.24 mg/dL   Calcium 7.8 (L) 8.9 - 10.3 mg/dL   GFR calc non Af Amer >60 >60 mL/min   GFR calc Af Amer >60 >60 mL/min   Anion gap 8 5 - 15  CBC with Differential/Platelet     Status: Abnormal   Collection Time: 08/21/15  5:45 AM  Result Value Ref Range   WBC 7.5 4.0 - 10.5 K/uL   RBC 2.71 (L) 4.22 - 5.81 MIL/uL   Hemoglobin 8.4 (L) 13.0 - 17.0 g/dL   HCT 09.6 (L) 04.5 - 40.9 %   MCV 100.0 78.0 - 100.0 fL   MCH 31.0 26.0 - 34.0 pg   MCHC 31.0 30.0 - 36.0 g/dL   RDW 81.1 91.4 - 78.2 %   Platelets 261 150 - 400 K/uL   Neutrophils Relative % 67 %   Neutro Abs 5.0 1.7 - 7.7 K/uL   Lymphocytes Relative 19 %   Lymphs Abs 1.4 0.7 - 4.0 K/uL   Monocytes Relative 11 %   Monocytes Absolute 0.8 0.1 - 1.0 K/uL   Eosinophils Relative 3 %   Eosinophils Absolute 0.2 0.0 - 0.7 K/uL   Basophils Relative 0 %   Basophils Absolute 0.0 0.0 - 0.1 K/uL     Assessment/Plan:   NEURO  Altered Mental Status:  agitation, delirium and but will follow some commands   Plan: CPM  PULM  Atelectasis/collapse (focal)   Plan: Wean ventilator as tolerated  CARDIO  No specific issues   Plan: No specific treatment  RENAL  Urine output and rrenal function is normal   Plan: No specific management  GI  No specific issues   Plan: CPM  ID  Pneumonia (hospital acquired (not ventilator-associated) Acinetobacter pneumonia)   Plan: Continue antibiotic for Acinetobacter, 10 day course  HEME  Anemia acute blood loss anemia)   Plan: No blood for now.  ENDO CPM    Plan: CPM  Global Issues  Weaning on the ventilator as tolerated and try to get the patient.  On Valium and will switch to per tube Klonopin 2 mg tid.  Recheck LFT's.  No other changes    LOS: 22 days   Additional comments:  We have reviewed all his labs and X-rays.  Will recheck some tomorrow.  Wean to the [facility]  Critical Care Total Time*: 30 Minutes  Azeez Dunker 08/21/2015  *Care during the described time interval was provided by me and/or other providers on the critical care team.  I have reviewed this patient's available data, including medical history, events of note, physical examination and test results as part of my evaluation.
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
## 2015-08-21 NOTE — Progress Notes (Signed)
Patient ID: William NordmannDonald Wayne Bentley, male   DOB: [birth year], 50 y.o.   MRN: [id number] No significant change in neurologic exam patient remains on Precedex  Awake assist him intermittently will follow commands right greater than left remains an L1 paraplegic although it does appear that he has some sensation.  CT scan of his head did show right frontal contusions that could explain left hemiparesis and his left upper extremity weakness. He also has had on his admission CT some widening of his left left-sided C6-7 facet. I will check a lateral C-spine tonight patient maintains in a cervical collar as he has throughout his admission.
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
## 2015-08-22 LAB — CBC WITH DIFFERENTIAL/PLATELET
BASOS ABS: 0 10*3/uL (ref 0.0–0.1)
BASOS PCT: 0 %
EOS ABS: 0.3 10*3/uL (ref 0.0–0.7)
Eosinophils Relative: 4 %
HCT: 26.2 % — ABNORMAL LOW (ref 39.0–52.0)
HEMOGLOBIN: 8 g/dL — AB (ref 13.0–17.0)
LYMPHS ABS: 1.4 10*3/uL (ref 0.7–4.0)
Lymphocytes Relative: 20 %
MCH: 30.2 pg (ref 26.0–34.0)
MCHC: 30.5 g/dL (ref 30.0–36.0)
MCV: 98.9 fL (ref 78.0–100.0)
Monocytes Absolute: 0.6 10*3/uL (ref 0.1–1.0)
Monocytes Relative: 10 %
NEUTROS PCT: 66 %
Neutro Abs: 4.4 10*3/uL (ref 1.7–7.7)
PLATELETS: 250 10*3/uL (ref 150–400)
RBC: 2.65 MIL/uL — AB (ref 4.22–5.81)
RDW: 15.2 % (ref 11.5–15.5)
WBC: 6.6 10*3/uL (ref 4.0–10.5)

## 2015-08-22 LAB — COMPREHENSIVE METABOLIC PANEL
ALT: 60 U/L (ref 17–63)
AST: 37 U/L (ref 15–41)
Albumin: 1.7 g/dL — ABNORMAL LOW (ref 3.5–5.0)
Alkaline Phosphatase: 115 U/L (ref 38–126)
Anion gap: 6 (ref 5–15)
BILIRUBIN TOTAL: 0.9 mg/dL (ref 0.3–1.2)
BUN: 17 mg/dL (ref 6–20)
CALCIUM: 7.9 mg/dL — AB (ref 8.9–10.3)
CHLORIDE: 107 mmol/L (ref 101–111)
CO2: 26 mmol/L (ref 22–32)
CREATININE: 0.54 mg/dL — AB (ref 0.61–1.24)
Glucose, Bld: 352 mg/dL — ABNORMAL HIGH (ref 65–99)
Potassium: 4.6 mmol/L (ref 3.5–5.1)
Sodium: 139 mmol/L (ref 135–145)
Total Protein: 5.8 g/dL — ABNORMAL LOW (ref 6.5–8.1)

## 2015-08-22 MED ORDER — BETHANECHOL CHLORIDE 10 MG PO TABS
10.0000 mg | ORAL_TABLET | Freq: Three times a day (TID) | ORAL | Status: DC
  Administered 2015-08-22 – 2015-09-03 (×39): 10 mg
  Filled 2015-08-22 (×41): qty 1

## 2015-08-22 MED ORDER — POTASSIUM CHLORIDE IN NACL 20-0.9 MEQ/L-% IV SOLN
INTRAVENOUS | Status: DC
  Administered 2015-08-22 – 2015-08-26 (×4): via INTRAVENOUS
  Administered 2015-08-28: 1000 mL via INTRAVENOUS
  Administered 2015-08-29 – 2015-08-31 (×4): via INTRAVENOUS
  Filled 2015-08-22 (×14): qty 1000

## 2015-08-22 MED ORDER — FUROSEMIDE 10 MG/ML IJ SOLN
40.0000 mg | Freq: Once | INTRAMUSCULAR | Status: AC
  Administered 2015-08-22: 40 mg via INTRAVENOUS
  Filled 2015-08-22: qty 4

## 2015-08-22 MED ORDER — CLONAZEPAM 1 MG PO TABS
2.0000 mg | ORAL_TABLET | Freq: Three times a day (TID) | ORAL | Status: DC
  Administered 2015-08-22 – 2015-08-28 (×21): 2 mg
  Filled 2015-08-22 (×21): qty 2

## 2015-08-22 NOTE — Progress Notes (Signed)
Patient ID: William NordmannDonald Wayne Hoadley, male   DOB: 23-Aug-1965, 50 y.o.   MRN: [id number] No change neurologically  Incision clean dry and intact  Lateral C-spine with limits of its view look okay  Continue to mobilize with physical and occupational therapy per trauma

## 2015-08-22 NOTE — Progress Notes (Signed)
RT entered room for Trach check to find patient with Seesaw respirations and accessory muscles use.  RR 46-50 bpm consistently.  No change in condition with suctioning.  Patient placed on ventilator with immediate improvement in respiratory rate and work of breathing.

## 2015-08-22 NOTE — Progress Notes (Signed)
Inpatient Diabetes Program Recommendations  AACE/ADA: New Consensus Statement on Inpatient Glycemic Control (2015)  Target Ranges:  Prepandial:   less than 140 mg/dL      Peak postprandial:   less than 180 mg/dL (1-2 hours)      Critically ill patients:  140 - 180 mg/dL   Review of Glycemic Control Lab glucose 200-300 Inpatient Diabetes Program Recommendations:   Consider ICU hyperglycemia protocol OR add resistant Novolog scale Q4 per Glycemic Control order-set. Thank you  Piedad ClimesGina Kyvon Hu BSN, RN,CDE Inpatient Diabetes Coordinator [phone] (team pager)

## 2015-08-22 NOTE — Progress Notes (Signed)
Trauma Service Note  Subjective: Patient is on trach collar and was on TC 14 hours yesterday.  Following commands today.  Objective: Vital signs in last 24 hours: Temp:  [98.8 F (37.1 C)-101.1 F (38.4 C)] 99.3 F (37.4 C) (05/02 0400) Pulse Rate:  [76-106] 78 (05/02 0811) Resp:  [18-42] 35 (05/02 0811) BP: (87-128)/(55-91) 87/60 mmHg (05/02 0811) SpO2:  [92 %-100 %] 97 % (05/02 0811) FiO2 (%):  [30 %-35 %] 35 % (05/02 0811) Weight:  [95 kg (209 lb 7 oz)] 95 kg (209 lb 7 oz) (05/02 0338) Last BM Date: 08/22/15  Intake/Output from previous day: 05/01 0701 - 05/02 0700 In: 4435.9 [I.V.:2335.9; NG/GT:1860; IV Piggyback:150] Out: 1850 [Urine:1850] Intake/Output this shift: Total I/O In: 158.2 [I.V.:93.2; NG/GT:65] Out: -   General: Sleepy but able to follow commands  Lungs: Clear.  Sats are okay.  RR 35  Abd: Soft and tolerating tube feedings well.  Extremities: No changes, will check venous doppler studies.  Neuro: Intact  Lab Results: CBC   Recent Labs  08/21/15 0545 08/22/15 0346  WBC 7.5 6.6  HGB 8.4* 8.0*  HCT 27.1* 26.2*  PLT 261 250   BMET  Recent Labs  08/21/15 0545 08/22/15 0346  NA 139 139  K 4.2 4.6  CL 104 107  CO2 27 26  GLUCOSE 296* 352*  BUN 20 17  CREATININE 0.52* 0.54*  CALCIUM 7.8* 7.9*   PT/INR No results for input(s): LABPROT, INR in the last 72 hours. ABG No results for input(s): PHART, HCO3 in the last 72 hours.  Invalid input(s): PCO2, PO2  Studies/Results: Dg Cervical Spine 1 View  08/21/2015  CLINICAL DATA:  Trauma EXAM: CERVICAL SPINE 1 VIEW COMPARISON:  CT of the head, face and cervical spine - 07/30/2015 FINDINGS: A solitary cross-table lateral swimmer's radiograph is provided of the cervical spine. C1 to the superior endplate of T2 is imaged. There is mild straightening expected cervical lordosis. No anterolisthesis or retrolisthesis. Prominent disc osteophyte complexes involving the C3-C4, C5-C6 and C6-C7  intervertebral disc spaces appear grossly unchanged. The previously questioned fractured osteophyte involving the anterior inferior aspect of the C6 vertebral body is suboptimally evaluated on the present examination given overlying osseous and soft tissue structures. Questioned widening involving the left C6-C7 facets is not evaluated on this lateral radiograph. An upper extremity IV is identified though the tip is obscured from view. IMPRESSION: Cross-table swimmer's radiograph of the cervical spine as detailed above. Note, the previously questioned osteophyte fracture involving the anterior inferior aspect of the C6 vertebral body is suboptimally evaluated secondary to overlying osseous and soft tissue structures and the question widening involving the left C6-C6 facets is not evaluated given solitary lateral projection. Electronically Signed   By: Simonne ComeJohn  Watts M.D.   On: 08/21/2015 17:25    Anti-infectives: Anti-infectives    Start     Dose/Rate Route Frequency Ordered Stop   08/16/15 1000  levofloxacin (LEVAQUIN) IVPB 750 mg     750 mg 100 mL/hr over 90 Minutes Intravenous Every 24 hours 08/16/15 0953     08/15/15 1200  ceFAZolin (ANCEF) IVPB 2g/100 mL premix  Status:  Discontinued     2 g 200 mL/hr over 30 Minutes Intravenous  Once 08/15/15 0858 08/16/15 0944   08/14/15 1200  ceFAZolin (ANCEF) IVPB 2g/100 mL premix  Status:  Discontinued     2 g 200 mL/hr over 30 Minutes Intravenous On call to O.R. 08/13/15 [id number] 08/15/15 0858   08/04/15 1800  vancomycin (  VANCOCIN) IVPB 1000 mg/200 mL premix  Status:  Discontinued     1,000 mg 200 mL/hr over 60 Minutes Intravenous Every 8 hours 08/04/15 0906 08/06/15 0817   08/04/15 1000  ceFEPIme (MAXIPIME) 2 g in dextrose 5 % 50 mL IVPB     2 g 100 mL/hr over 30 Minutes Intravenous Every 8 hours 08/04/15 0906 08/13/15 0231   08/04/15 1000  vancomycin (VANCOCIN) 2,000 mg in sodium chloride 0.9 % 500 mL IVPB     2,000 mg 250 mL/hr over 120 Minutes  Intravenous  Once 08/04/15 0906 08/04/15 1446   07/31/15 1930  ceFAZolin (ANCEF) IVPB 2g/100 mL premix  Status:  Discontinued     2 g 200 mL/hr over 30 Minutes Intravenous 3 times per day 07/31/15 1923 08/04/15 0858   07/31/15 1715  ceFAZolin (ANCEF) 2-4 GM/100ML-% IVPB  Status:  Discontinued    Comments:  Key, Jennifer   : cabinet override      07/31/15 1715 07/31/15 1923   07/31/15 1436  vancomycin (VANCOCIN) powder  Status:  Discontinued       As needed 07/31/15 1436 07/31/15 1844   07/31/15 1300  bacitracin 50,000 Units in sodium chloride irrigation 0.9 % 500 mL irrigation  Status:  Discontinued       As needed 07/31/15 1353 07/31/15 1844   07/31/15 1240  vancomycin (VANCOCIN) 1000 MG powder  Status:  Discontinued    Comments:  Ratcliff, Esther   : cabinet override      07/31/15 1240 07/31/15 1248   07/31/15 1231  vancomycin (VANCOCIN) 1000 MG powder    Comments:  Dorinda Hill   : cabinet override      07/31/15 1231 08/01/15 0044      Assessment/Plan: s/p Procedure(s): PERCUTANEOUS ENDOSCOPIC GASTROSTOMY (PEG) PLACEMENT Continue antibiotics for pneumonia Trach collar for as long as tolerated.   Urecholine for retention--has foley catheter in place for now  LOS: 23 days   Marta Lamas. Gae Bon, MD, FACS [phone] Trauma Surgeon 08/22/2015

## 2015-08-22 NOTE — Progress Notes (Signed)
Pharmacy Antibiotic Note  William NordmannDonald Wayne Logan is a 50 y.o. male admitted on 07/30/2015 with acinetobacter pneumonia.  Pharmacy has been consulted for levofloxacin dosing. Tmax 101.1 (yesterday), Tc 99.3, WBC WNL. CrCl>100. Pt is hypotensive today w/ most recent BP 87/60.  Today is day #7 of levofloxacin.   Plan: -Continue Levofloxacin 750mg  IV q24h  -Monitor clinical progress, renal function -F/U LOT (likely at least 10 days, pending clinical improvement)   Height: 5\' 11"  (180.3 cm) Weight: 209 lb 7 oz (95 kg) IBW/kg (Calculated) : 75.3  Temp (24hrs), Avg:99.6 F (37.6 C), Min:98.8 F (37.1 C), Max:101.1 F (38.4 C)   Recent Labs Lab 08/16/15 0540 08/17/15 0520 08/18/15 0425 08/20/15 1131 08/21/15 0545 08/22/15 0346  WBC 9.7 7.9 7.1 6.6 7.5 6.6  CREATININE 0.59* 0.53* 0.56*  --  0.52* 0.54*    Estimated Creatinine Clearance: 131.4 mL/min (by C-G formula based on Cr of 0.54).    No Known Allergies  Antimicrobials this admission: Ancef 4/10>>4/14 Vanc 4/14>>4/16 Cefepime 4/14>> 4/23 Levo 4/26 >>  Dose adjustments this admission: n/a  Microbiology results: 4/23 TA: acinetobacter (pan-S) 4/20 UC: neg 4/12 BCx: ngf 4/12 urine: neg 4/12 TA: h. Flu beta-lactamase (NOT esbl) 4/9 MRSA - NEG  Arcola JanskyMeagan Sayyid Harewood, PharmD Clinical Pharmacy Resident Pager: [phone]  08/22/2015 8:50 AM

## 2015-08-22 NOTE — Progress Notes (Signed)
Trach sutures removed per MD order without any complications.  

## 2015-08-23 LAB — CBC WITH DIFFERENTIAL/PLATELET
BASOS ABS: 0 10*3/uL (ref 0.0–0.1)
BASOS PCT: 0 %
EOS ABS: 0.3 10*3/uL (ref 0.0–0.7)
EOS PCT: 6 %
HCT: 28.9 % — ABNORMAL LOW (ref 39.0–52.0)
Hemoglobin: 8.9 g/dL — ABNORMAL LOW (ref 13.0–17.0)
Lymphocytes Relative: 27 %
Lymphs Abs: 1.6 10*3/uL (ref 0.7–4.0)
MCH: 30.8 pg (ref 26.0–34.0)
MCHC: 30.8 g/dL (ref 30.0–36.0)
MCV: 100 fL (ref 78.0–100.0)
MONO ABS: 0.5 10*3/uL (ref 0.1–1.0)
MONOS PCT: 9 %
NEUTROS ABS: 3.3 10*3/uL (ref 1.7–7.7)
Neutrophils Relative %: 58 %
PLATELETS: 242 10*3/uL (ref 150–400)
RBC: 2.89 MIL/uL — ABNORMAL LOW (ref 4.22–5.81)
RDW: 14.9 % (ref 11.5–15.5)
WBC: 5.7 10*3/uL (ref 4.0–10.5)

## 2015-08-23 LAB — BASIC METABOLIC PANEL
Anion gap: 7 (ref 5–15)
BUN: 19 mg/dL (ref 6–20)
CO2: 28 mmol/L (ref 22–32)
Calcium: 8.3 mg/dL — ABNORMAL LOW (ref 8.9–10.3)
Chloride: 106 mmol/L (ref 101–111)
Creatinine, Ser: 0.49 mg/dL — ABNORMAL LOW (ref 0.61–1.24)
GFR calc non Af Amer: >60 mL/min (ref >60)
GLUCOSE: 113 mg/dL — AB (ref 65–99)
Potassium: 4.3 mmol/L (ref 3.5–5.1)
Sodium: 141 mmol/L (ref 135–145)

## 2015-08-23 LAB — GLUCOSE, CAPILLARY: Glucose-Capillary: 96 mg/dL (ref 65–99)

## 2015-08-23 MED ORDER — HYDROCODONE-ACETAMINOPHEN 7.5-325 MG/15ML PO SOLN
10.0000 mL | ORAL | Status: DC | PRN
  Administered 2015-08-23 – 2015-08-28 (×11): 10 mL
  Filled 2015-08-23 (×11): qty 15

## 2015-08-23 MED ORDER — DOCUSATE SODIUM 50 MG/5ML PO LIQD
100.0000 mg | Freq: Two times a day (BID) | ORAL | Status: DC | PRN
  Filled 2015-08-23: qty 10
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
## 2015-08-23 NOTE — Progress Notes (Signed)
RT removed PT from vent and placed on 60% ATC- tolerating well at this time- RN aware.  Sp02 97 HR86 RR20 BP 102/70

## 2015-08-23 NOTE — Progress Notes (Signed)
Trauma Service Note  Subjective: Patient doing well.  On trach collar again today.  Following commands.  Objective: Vital signs in last 24 hours: Temp:  [97.8 F (36.6 C)-99.2 F (37.3 C)] 97.8 F (36.6 C) (05/03 0400) Pulse Rate:  [77-105] 86 (05/03 0800) Resp:  [18-45] 27 (05/03 0800) BP: (85-132)/(60-87) 90/71 mmHg (05/03 0800) SpO2:  [86 %-100 %] 97 % (05/03 0800) FiO2 (%):  [30 %-60 %] 60 % (05/03 0800) Weight:  [93.3 kg (205 lb 11 oz)] 93.3 kg (205 lb 11 oz) (05/03 0500) Last BM Date: 08/22/15  Intake/Output from previous day: 05/02 0701 - 05/03 0700 In: 4030.5 [I.V.:1670.5; NG/GT:2160; IV Piggyback:150] Out: 4080 [Urine:3830; Stool:250] Intake/Output this shift: Total I/O In: 63 [I.V.:63] Out: 125 [Urine:125]  General: No acute distress.    Lungs: Clear.  Questionable aspiration this am, but not suctioning any tube feedings from trach.  Sats are good on TCNo CXR today.  Abd: Soft, Tube feedings are off.  Will restart today.  Good bowel sounds.  Extremities: No changes.   Neuro: Deficit have not changed.    Skin:  Has some skin breakdown in the perianal/perirectal area.  Excoriation of skin also.  Lab Results: CBC   Recent Labs  08/22/15 0346 08/23/15 0525  WBC 6.6 5.7  HGB 8.0* 8.9*  HCT 26.2* 28.9*  PLT 250 242   BMET  Recent Labs  08/22/15 0346 08/23/15 0525  NA 139 141  K 4.6 4.3  CL 107 106  CO2 26 28  GLUCOSE 352* 113*  BUN 17 19  CREATININE 0.54* 0.49*  CALCIUM 7.9* 8.3*   PT/INR No results for input(s): LABPROT, INR in the last 72 hours. ABG No results for input(s): PHART, HCO3 in the last 72 hours.  Invalid input(s): PCO2, PO2  Studies/Results: Dg Cervical Spine 1 View  08/21/2015  CLINICAL DATA:  Trauma EXAM: CERVICAL SPINE 1 VIEW COMPARISON:  CT of the head, face and cervical spine - 07/30/2015 FINDINGS: A solitary cross-table lateral swimmer's radiograph is provided of the cervical spine. C1 to the superior endplate of T2 is  imaged. There is mild straightening expected cervical lordosis. No anterolisthesis or retrolisthesis. Prominent disc osteophyte complexes involving the C3-C4, C5-C6 and C6-C7 intervertebral disc spaces appear grossly unchanged. The previously questioned fractured osteophyte involving the anterior inferior aspect of the C6 vertebral body is suboptimally evaluated on the present examination given overlying osseous and soft tissue structures. Questioned widening involving the left C6-C7 facets is not evaluated on this lateral radiograph. An upper extremity IV is identified though the tip is obscured from view. IMPRESSION: Cross-table swimmer's radiograph of the cervical spine as detailed above. Note, the previously questioned osteophyte fracture involving the anterior inferior aspect of the C6 vertebral body is suboptimally evaluated secondary to overlying osseous and soft tissue structures and the question widening involving the left C6-C6 facets is not evaluated given solitary lateral projection. Electronically Signed   By: Simonne Come M.D.   On: 08/21/2015 17:25    Anti-infectives: Anti-infectives    Start     Dose/Rate Route Frequency Ordered Stop   08/16/15 1000  levofloxacin (LEVAQUIN) IVPB 750 mg     750 mg 100 mL/hr over 90 Minutes Intravenous Every 24 hours 08/16/15 0953     08/15/15 1200  ceFAZolin (ANCEF) IVPB 2g/100 mL premix  Status:  Discontinued     2 g 200 mL/hr over 30 Minutes Intravenous  Once 08/15/15 0858 08/16/15 0944   08/14/15 1200  ceFAZolin (ANCEF) IVPB  2g/100 mL premix  Status:  Discontinued     2 g 200 mL/hr over 30 Minutes Intravenous On call to O.R. 08/13/15 [id number] 08/15/15 0858   08/04/15 1800  vancomycin (VANCOCIN) IVPB 1000 mg/200 mL premix  Status:  Discontinued     1,000 mg 200 mL/hr over 60 Minutes Intravenous Every 8 hours 08/04/15 0906 08/06/15 0817   08/04/15 1000  ceFEPIme (MAXIPIME) 2 g in dextrose 5 % 50 mL IVPB     2 g 100 mL/hr over 30 Minutes Intravenous  Every 8 hours 08/04/15 0906 08/13/15 0231   08/04/15 1000  vancomycin (VANCOCIN) 2,000 mg in sodium chloride 0.9 % 500 mL IVPB     2,000 mg 250 mL/hr over 120 Minutes Intravenous  Once 08/04/15 0906 08/04/15 1446   07/31/15 1930  ceFAZolin (ANCEF) IVPB 2g/100 mL premix  Status:  Discontinued     2 g 200 mL/hr over 30 Minutes Intravenous 3 times per day 07/31/15 1923 08/04/15 0858   07/31/15 1715  ceFAZolin (ANCEF) 2-4 GM/100ML-% IVPB  Status:  Discontinued    Comments:  Key, Jennifer   : cabinet override      07/31/15 1715 07/31/15 1923   07/31/15 1436  vancomycin (VANCOCIN) powder  Status:  Discontinued       As needed 07/31/15 1436 07/31/15 1844   07/31/15 1300  bacitracin 50,000 Units in sodium chloride irrigation 0.9 % 500 mL irrigation  Status:  Discontinued       As needed 07/31/15 1353 07/31/15 1844   07/31/15 1240  vancomycin (VANCOCIN) 1000 MG powder  Status:  Discontinued    Comments:  Ratcliff, Esther   : cabinet override      07/31/15 1240 07/31/15 1248   07/31/15 1231  vancomycin (VANCOCIN) 1000 MG powder    Comments:  Dorinda HillKey, Jennifer   : cabinet override      07/31/15 1231 08/01/15 0044      Assessment/Plan: s/p Procedure(s): PERCUTANEOUS ENDOSCOPIC GASTROSTOMY (PEG) PLACEMENT resume tube feedings.  Flexiseal tube   LOS: 24 days   Marta LamasJames O. Gae BonWyatt, III, MD, FACS [phone] Trauma Surgeon 08/23/2015

## 2015-08-24 DIAGNOSIS — L8990 Pressure ulcer of unspecified site, unspecified stage: Secondary | ICD-10-CM | POA: Diagnosis not present

## 2015-08-24 MED ORDER — QUETIAPINE FUMARATE 200 MG PO TABS
200.0000 mg | ORAL_TABLET | Freq: Three times a day (TID) | ORAL | Status: DC
  Administered 2015-08-24 – 2015-08-28 (×14): 200 mg
  Filled 2015-08-24 (×14): qty 1

## 2015-08-24 MED ORDER — HALOPERIDOL LACTATE 5 MG/ML IJ SOLN
10.0000 mg | Freq: Four times a day (QID) | INTRAMUSCULAR | Status: DC | PRN
  Administered 2015-08-25 – 2015-09-09 (×4): 10 mg via INTRAVENOUS
  Administered 2015-09-09 (×2): 5 mg via INTRAVENOUS
  Administered 2015-09-10 – 2015-09-16 (×3): 10 mg via INTRAVENOUS
  Filled 2015-08-24 (×9): qty 2
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
## 2015-08-24 NOTE — Progress Notes (Signed)
Trauma Service Note  Subjective: Patient agitated and all over the place, but on trach collar  Objective: Vital signs in last 24 hours: Temp:  [97.3 F (36.3 C)-99 F (37.2 C)] 98.5 F (36.9 C) (05/04 0755) Pulse Rate:  [53-128] 95 (05/04 0755) Resp:  [17-51] 21 (05/04 0755) BP: (88-133)/(47-92) 111/66 mmHg (05/04 0700) SpO2:  [78 %-100 %] 100 % (05/04 0755) FiO2 (%):  [40 %-60 %] 40 % (05/04 0755) Weight:  [94.3 kg (207 lb 14.3 oz)] 94.3 kg (207 lb 14.3 oz) (05/04 0327) Last BM Date: 08/23/15  Intake/Output from previous day: 05/03 0701 - 05/04 0700 In: 3269.1 [I.V.:1384.1; NG/GT:1535; IV Piggyback:150] Out: 1830 [Urine:1830] Intake/Output this shift:    General: Agitated generally.  Lungs: Clear but RR is in the 30's  Abd: Benign, tolerating tube feedings well.  Extremities: No changes  Neuro: Agitated.  Will move left upper extremity a bit.  Lab Results: CBC   Recent Labs  08/22/15 0346 08/23/15 0525  WBC 6.6 5.7  HGB 8.0* 8.9*  HCT 26.2* 28.9*  PLT 250 242   BMET  Recent Labs  08/22/15 0346 08/23/15 0525  NA 139 141  K 4.6 4.3  CL 107 106  CO2 26 28  GLUCOSE 352* 113*  BUN 17 19  CREATININE 0.54* 0.49*  CALCIUM 7.9* 8.3*   PT/INR No results for input(s): LABPROT, INR in the last 72 hours. ABG No results for input(s): PHART, HCO3 in the last 72 hours.  Invalid input(s): PCO2, PO2  Studies/Results: No results found.  Anti-infectives: Anti-infectives    Start     Dose/Rate Route Frequency Ordered Stop   08/16/15 1000  levofloxacin (LEVAQUIN) IVPB 750 mg     750 mg 100 mL/hr over 90 Minutes Intravenous Every 24 hours 08/16/15 0953     08/15/15 1200  ceFAZolin (ANCEF) IVPB 2g/100 mL premix  Status:  Discontinued     2 g 200 mL/hr over 30 Minutes Intravenous  Once 08/15/15 0858 08/16/15 0944   08/14/15 1200  ceFAZolin (ANCEF) IVPB 2g/100 mL premix  Status:  Discontinued     2 g 200 mL/hr over 30 Minutes Intravenous On call to O.R.  08/13/15 [id number] 08/15/15 0858   08/04/15 1800  vancomycin (VANCOCIN) IVPB 1000 mg/200 mL premix  Status:  Discontinued     1,000 mg 200 mL/hr over 60 Minutes Intravenous Every 8 hours 08/04/15 0906 08/06/15 0817   08/04/15 1000  ceFEPIme (MAXIPIME) 2 g in dextrose 5 % 50 mL IVPB     2 g 100 mL/hr over 30 Minutes Intravenous Every 8 hours 08/04/15 0906 08/13/15 0231   08/04/15 1000  vancomycin (VANCOCIN) 2,000 mg in sodium chloride 0.9 % 500 mL IVPB     2,000 mg 250 mL/hr over 120 Minutes Intravenous  Once 08/04/15 0906 08/04/15 1446   07/31/15 1930  ceFAZolin (ANCEF) IVPB 2g/100 mL premix  Status:  Discontinued     2 g 200 mL/hr over 30 Minutes Intravenous 3 times per day 07/31/15 1923 08/04/15 0858   07/31/15 1715  ceFAZolin (ANCEF) 2-4 GM/100ML-% IVPB  Status:  Discontinued    Comments:  Key, Jennifer   : cabinet override      07/31/15 1715 07/31/15 1923   07/31/15 1436  vancomycin (VANCOCIN) powder  Status:  Discontinued       As needed 07/31/15 1436 07/31/15 1844   07/31/15 1300  bacitracin 50,000 Units in sodium chloride irrigation 0.9 % 500 mL irrigation  Status:  Discontinued  As needed 07/31/15 1353 07/31/15 1844   07/31/15 1240  vancomycin (VANCOCIN) 1000 MG powder  Status:  Discontinued    Comments:  Ratcliff, Esther   : cabinet override      07/31/15 1240 07/31/15 1248   07/31/15 1231  vancomycin (VANCOCIN) 1000 MG powder    Comments:  Dorinda HillKey, Jennifer   : cabinet override      07/31/15 1231 08/01/15 0044      Assessment/Plan: s/p Procedure(s): PERCUTANEOUS ENDOSCOPIC GASTROSTOMY (PEG) PLACEMENT Continue to work with therapies.  Consider SNF or Rehab in the near future Try to keep the patient off ventilator for 24 hours  LOS: 25 days   Marta LamasJames O. Gae BonWyatt, III, MD, FACS [phone] Trauma Surgeon 08/24/2015

## 2015-08-24 NOTE — Progress Notes (Signed)
Per RN pt had a run of SVT, pt very tachypnic and agitated.  Pt placed back on vent-  Full vent support.  RN at bedside.

## 2015-08-25 LAB — BASIC METABOLIC PANEL
ANION GAP: 13 (ref 5–15)
BUN: 18 mg/dL (ref 6–20)
CALCIUM: 8.5 mg/dL — AB (ref 8.9–10.3)
CO2: 28 mmol/L (ref 22–32)
Chloride: 101 mmol/L (ref 101–111)
Creatinine, Ser: 0.44 mg/dL — ABNORMAL LOW (ref 0.61–1.24)
GFR calc Af Amer: >60 mL/min (ref >60)
GFR calc non Af Amer: >60 mL/min (ref >60)
GLUCOSE: 121 mg/dL — AB (ref 65–99)
Potassium: 4.1 mmol/L (ref 3.5–5.1)
Sodium: 142 mmol/L (ref 135–145)
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
## 2015-08-25 NOTE — Progress Notes (Signed)
Trauma Service Note  Subjective: Patient anxious off the ventilator on trach collar.  Objective: Vital signs in last 24 hours: Temp:  [98.4 F (36.9 C)-99.5 F (37.5 C)] 98.4 F (36.9 C) (05/05 0400) Pulse Rate:  [86-125] 97 (05/05 0817) Resp:  [14-35] 20 (05/05 0817) BP: (94-139)/(56-103) 104/64 mmHg (05/05 0817) SpO2:  [93 %-100 %] 97 % (05/05 0817) FiO2 (%):  [40 %] 40 % (05/05 0817) Weight:  [93.9 kg (207 lb 0.2 oz)] 93.9 kg (207 lb 0.2 oz) (05/05 0418) Last BM Date: 08/24/15  Intake/Output from previous day: 05/04 0701 - 05/05 0700 In: 3400 [I.V.:1200; NG/GT:1860; IV Piggyback:150] Out: 3050 [Urine:3050] Intake/Output this shift:    General: No acute distress but anxious.  Lungs: Coarse bilaterally, sats are only 90% on FIO2 50%  Abd: Soft, good bowel sounds and tolerating tube feedings well.  Extremities: No changes.    Neuro: No changes  Lab Results: CBC   Recent Labs  08/23/15 0525  WBC 5.7  HGB 8.9*  HCT 28.9*  PLT 242   BMET  Recent Labs  08/23/15 0525 08/25/15 0528  NA 141 142  K 4.3 4.1  CL 106 101  CO2 28 28  GLUCOSE 113* 121*  BUN 19 18  CREATININE 0.49* 0.44*  CALCIUM 8.3* 8.5*   PT/INR No results for input(s): LABPROT, INR in the last 72 hours. ABG No results for input(s): PHART, HCO3 in the last 72 hours.  Invalid input(s): PCO2, PO2  Studies/Results: No results found.  Anti-infectives: Anti-infectives    Start     Dose/Rate Route Frequency Ordered Stop   08/16/15 1000  levofloxacin (LEVAQUIN) IVPB 750 mg     750 mg 100 mL/hr over 90 Minutes Intravenous Every 24 hours 08/16/15 0953     08/15/15 1200  ceFAZolin (ANCEF) IVPB 2g/100 mL premix  Status:  Discontinued     2 g 200 mL/hr over 30 Minutes Intravenous  Once 08/15/15 0858 08/16/15 0944   08/14/15 1200  ceFAZolin (ANCEF) IVPB 2g/100 mL premix  Status:  Discontinued     2 g 200 mL/hr over 30 Minutes Intravenous On call to O.R. 08/13/15 2956 08/15/15 0858   08/04/15 1800  vancomycin (VANCOCIN) IVPB 1000 mg/200 mL premix  Status:  Discontinued     1,000 mg 200 mL/hr over 60 Minutes Intravenous Every 8 hours 08/04/15 0906 08/06/15 0817   08/04/15 1000  ceFEPIme (MAXIPIME) 2 g in dextrose 5 % 50 mL IVPB     2 g 100 mL/hr over 30 Minutes Intravenous Every 8 hours 08/04/15 0906 08/13/15 0231   08/04/15 1000  vancomycin (VANCOCIN) 2,000 mg in sodium chloride 0.9 % 500 mL IVPB     2,000 mg 250 mL/hr over 120 Minutes Intravenous  Once 08/04/15 0906 08/04/15 1446   07/31/15 1930  ceFAZolin (ANCEF) IVPB 2g/100 mL premix  Status:  Discontinued     2 g 200 mL/hr over 30 Minutes Intravenous 3 times per day 07/31/15 1923 08/04/15 0858   07/31/15 1715  ceFAZolin (ANCEF) 2-4 GM/100ML-% IVPB  Status:  Discontinued    Comments:  Key, Jennifer   : cabinet override      07/31/15 1715 07/31/15 1923   07/31/15 1436  vancomycin (VANCOCIN) powder  Status:  Discontinued       As needed 07/31/15 1436 07/31/15 1844   07/31/15 1300  bacitracin 50,000 Units in sodium chloride irrigation 0.9 % 500 mL irrigation  Status:  Discontinued       As needed 07/31/15  1353 07/31/15 1844   07/31/15 1240  vancomycin (VANCOCIN) 1000 MG powder  Status:  Discontinued    Comments:  Ratcliff, Esther   : cabinet override      07/31/15 1240 07/31/15 1248   07/31/15 1231  vancomycin (VANCOCIN) 1000 MG powder    Comments:  Dorinda HillKey, Jennifer   : cabinet override      07/31/15 1231 08/01/15 0044      Assessment/Plan: s/p Procedure(s): PERCUTANEOUS ENDOSCOPIC GASTROSTOMY (PEG) PLACEMENT [facility]    Antibiotics to stop today for treatment of Acinetobacter  LOS: 26 days   Marta LamasJames O. Gae BonWyatt, III, MD, FACS [phone] Trauma Surgeon 08/25/2015

## 2015-08-25 NOTE — Progress Notes (Signed)
RN called d/t pt desat to 88% on 60% ATC, increased WOB, RR  and agitation noted.  Placed pt back on vent full support.

## 2015-08-25 NOTE — Plan of Care (Signed)
Problem: Bladder/Genitourinary: Goal: Urinary functional status for postoperative course will improve Outcome: Not Progressing I&O Cath q6 hours 02:00, 08:00, 14:00, 20:00, (no bladder scans).  My chose to use condom cath in between at RN's discreation.
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
## 2015-08-26 NOTE — Progress Notes (Deleted)
Sat at 88% on 40% fio2, increased to 50% fio2.  RN aware.
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
## 2015-08-26 NOTE — Progress Notes (Signed)
Came to room and noted pt tachycardic HR 126, RR 40's, pt desat at 88%.  Placed back on full vent support, RN aware.

## 2015-08-27 LAB — C DIFFICILE QUICK SCREEN W PCR REFLEX
C DIFFICILE (CDIFF) TOXIN: NEGATIVE
C DIFFICLE (CDIFF) ANTIGEN: NEGATIVE
C Diff interpretation: NEGATIVE

## 2015-08-27 NOTE — Progress Notes (Signed)
RN called d/t pt desat, increased HR and RR.  Placed pt back on vent.  RN aware, pt tol well currently.  No distress presently noted.
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
## 2015-08-28 NOTE — Progress Notes (Signed)
RT placed patient back on Vent to rest. Patient had increased RR and using accessory muscles. Patient is tolerating well RT will continue to monitor.

## 2015-08-28 NOTE — Progress Notes (Signed)
Trauma Service Note  Subjective: Patient calm currently off the ventilator on trach collar.  Objective: Vital signs in last 24 hours: Temp:  [98.4 F (36.9 C)-100 F (37.8 C)] 98.6 F (37 C) (05/08 0800) Pulse Rate:  [81-123] 100 (05/08 0950) Resp:  [18-35] 23 (05/08 0950) BP: (92-134)/(58-94) 99/85 mmHg (05/08 0900) SpO2:  [91 %-100 %] 95 % (05/08 0950) FiO2 (%):  [40 %] 40 % (05/08 0823) Weight:  [91.3 kg (201 lb 4.5 oz)] 91.3 kg (201 lb 4.5 oz) (05/08 0325) Last BM Date: 08/28/15  Intake/Output from previous day: 05/07 0701 - 05/08 0700 In: 3360 [I.V.:1200; NG/GT:2160] Out: 2625 [Urine:2625] Intake/Output this shift: Total I/O In: 115 [I.V.:50; NG/GT:65] Out: -   General: No acute distress.  Has onnly received scheduled Klonopin and Seroquel.    Haldol at 4AM possible made patient worse.  Lungs: Clear to auscultatiion  Abd: Soft, having diarrhea.  C.diff negative.  Extremities: No changes  Neuro: Same deficits.  Has not apparently gotten out of bed.  Lab Results: CBC  No results for input(s): WBC, HGB, HCT, PLT in the last 72 hours. BMET No results for input(s): NA, K, CL, CO2, GLUCOSE, BUN, CREATININE, CALCIUM in the last 72 hours. PT/INR No results for input(s): LABPROT, INR in the last 72 hours. ABG No results for input(s): PHART, HCO3 in the last 72 hours.  Invalid input(s): PCO2, PO2  Studies/Results: No results found.  Anti-infectives: Anti-infectives    Start     Dose/Rate Route Frequency Ordered Stop   08/16/15 1000  levofloxacin (LEVAQUIN) IVPB 750 mg     750 mg 100 mL/hr over 90 Minutes Intravenous Every 24 hours 08/16/15 0953 08/25/15 1211   08/15/15 1200  ceFAZolin (ANCEF) IVPB 2g/100 mL premix  Status:  Discontinued     2 g 200 mL/hr over 30 Minutes Intravenous  Once 08/15/15 0858 08/16/15 0944   08/14/15 1200  ceFAZolin (ANCEF) IVPB 2g/100 mL premix  Status:  Discontinued     2 g 200 mL/hr over 30 Minutes Intravenous On call to O.R.  08/13/15 [id number] 08/15/15 0858   08/04/15 1800  vancomycin (VANCOCIN) IVPB 1000 mg/200 mL premix  Status:  Discontinued     1,000 mg 200 mL/hr over 60 Minutes Intravenous Every 8 hours 08/04/15 0906 08/06/15 0817   08/04/15 1000  ceFEPIme (MAXIPIME) 2 g in dextrose 5 % 50 mL IVPB     2 g 100 mL/hr over 30 Minutes Intravenous Every 8 hours 08/04/15 0906 08/13/15 0231   08/04/15 1000  vancomycin (VANCOCIN) 2,000 mg in sodium chloride 0.9 % 500 mL IVPB     2,000 mg 250 mL/hr over 120 Minutes Intravenous  Once 08/04/15 0906 08/04/15 1446   07/31/15 1930  ceFAZolin (ANCEF) IVPB 2g/100 mL premix  Status:  Discontinued     2 g 200 mL/hr over 30 Minutes Intravenous 3 times per day 07/31/15 1923 08/04/15 0858   07/31/15 1715  ceFAZolin (ANCEF) 2-4 GM/100ML-% IVPB  Status:  Discontinued    Comments:  Key, Jennifer   : cabinet override      07/31/15 1715 07/31/15 1923   07/31/15 1436  vancomycin (VANCOCIN) powder  Status:  Discontinued       As needed 07/31/15 1436 07/31/15 1844   07/31/15 1300  bacitracin 50,000 Units in sodium chloride irrigation 0.9 % 500 mL irrigation  Status:  Discontinued       As needed 07/31/15 1353 07/31/15 1844   07/31/15 1240  vancomycin (VANCOCIN) 1000 MG  powder  Status:  Discontinued    Comments:  Lannie Fields, Esther   : cabinet override      07/31/15 1240 07/31/15 1248   07/31/15 1231  vancomycin (VANCOCIN) 1000 MG powder    Comments:  Dorinda Hill   : cabinet override      07/31/15 1231 08/01/15 0044      Assessment/Plan: s/p Procedure(s): PERCUTANEOUS ENDOSCOPIC GASTROSTOMY (PEG) PLACEMENT Try to keep the patient off the ventilator.  Possibly go the LTAC today.  LOS: 29 days   Marta Lamas. Gae Bon, MD, FACS [phone] Trauma Surgeon 08/28/2015

## 2015-08-29 LAB — CBC WITH DIFFERENTIAL/PLATELET
Basophils Absolute: 0 10*3/uL (ref 0.0–0.1)
Basophils Relative: 0 %
Eosinophils Absolute: 0.2 10*3/uL (ref 0.0–0.7)
Eosinophils Relative: 2 %
HCT: 32.6 % — ABNORMAL LOW (ref 39.0–52.0)
HEMOGLOBIN: 10 g/dL — AB (ref 13.0–17.0)
LYMPHS PCT: 18 %
Lymphs Abs: 1.4 10*3/uL (ref 0.7–4.0)
MCH: 29.7 pg (ref 26.0–34.0)
MCHC: 30.7 g/dL (ref 30.0–36.0)
MCV: 96.7 fL (ref 78.0–100.0)
Monocytes Absolute: 0.6 10*3/uL (ref 0.1–1.0)
Monocytes Relative: 8 %
NEUTROS ABS: 5.7 10*3/uL (ref 1.7–7.7)
Neutrophils Relative %: 72 %
Platelets: 236 10*3/uL (ref 150–400)
RBC: 3.37 MIL/uL — ABNORMAL LOW (ref 4.22–5.81)
RDW: 15.2 % (ref 11.5–15.5)
WBC: 7.8 10*3/uL (ref 4.0–10.5)

## 2015-08-29 LAB — BASIC METABOLIC PANEL
ANION GAP: 10 (ref 5–15)
BUN: 16 mg/dL (ref 6–20)
CHLORIDE: 103 mmol/L (ref 101–111)
CO2: 27 mmol/L (ref 22–32)
Calcium: 9.1 mg/dL (ref 8.9–10.3)
Creatinine, Ser: 0.47 mg/dL — ABNORMAL LOW (ref 0.61–1.24)
GFR calc non Af Amer: >60 mL/min (ref >60)
Glucose, Bld: 115 mg/dL — ABNORMAL HIGH (ref 65–99)
POTASSIUM: 4 mmol/L (ref 3.5–5.1)
SODIUM: 140 mmol/L (ref 135–145)

## 2015-08-29 MED ORDER — CLONAZEPAM 1 MG PO TABS
2.0000 mg | ORAL_TABLET | Freq: Three times a day (TID) | ORAL | Status: DC
  Administered 2015-08-29 – 2015-09-08 (×30): 2 mg
  Filled 2015-08-29 (×30): qty 2

## 2015-08-29 MED ORDER — QUETIAPINE FUMARATE 200 MG PO TABS
200.0000 mg | ORAL_TABLET | Freq: Three times a day (TID) | ORAL | Status: DC
  Administered 2015-08-29 – 2015-09-04 (×18): 200 mg
  Filled 2015-08-29 (×18): qty 1

## 2015-08-29 MED ORDER — CLONAZEPAM 1 MG PO TABS: 2.0000 mg | ORAL_TABLET | Freq: Three times a day (TID) | ORAL | Status: DC

## 2015-08-29 MED ORDER — PIVOT 1.5 CAL PO LIQD
1000.0000 mL | ORAL | Status: DC
  Administered 2015-08-29 – 2015-09-05 (×8): 1000 mL
  Filled 2015-08-29 (×15): qty 1000

## 2015-08-29 MED ORDER — QUETIAPINE FUMARATE 200 MG PO TABS: 200.0000 mg | ORAL_TABLET | Freq: Three times a day (TID) | ORAL | Status: DC
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
## 2015-08-29 NOTE — Progress Notes (Addendum)
[facility] due to issues with [facility]'s insurance.  Appears pt's insurance will end on June 30, and pt will have COBRA benefits, but wife uncertain that she will be able to afford the $500/month COBRA premiums.    Notified pt's wife of [facility] denial.  Advised her to [place]...she states she prefers to wait on this at this time, as pt has short and long term disability.    [facility] not interested in pursuing admission due to same issues with insurance.  Spoke with Eunice Blaseebbie, Case Manager from [employer] [phone]):  She states from pt clinical information, it appears that pt likely will need custodial care upon dc, and likely would not have been approved for LTAC by insurance company anyway.  She encourages wife to apply for Medicaid benefits.  Reiterated this to wife, but she states that pt is getting better, and she wants to wait on Medicaid.    Pt not a candidate for CIR at this time, as he is a Rancho III level head injury, and has been requiring vent at intervals.  Pt did wean on trach collar yesterday, which was good progress.  Will continue to follow for disposition.    Quintella BatonJulie W. Azariah Bonura, RN, BSN  Trauma/Neuro ICU Case Manager [phone]
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
## 2015-08-29 NOTE — Progress Notes (Signed)
Trauma Service Note  Subjective: Patient on trach collar this morning and looks great.  May need to be re-evaluated by Rehab.    Objective: Vital signs in last 24 hours: Temp:  [97.8 F (36.6 C)-99.1 F (37.3 C)] 99.1 F (37.3 C) (05/09 0400) Pulse Rate:  [89-121] 101 (05/09 0806) Resp:  [17-40] 17 (05/09 0810) BP: (86-138)/(55-91) 103/87 mmHg (05/09 0810) SpO2:  [89 %-100 %] 100 % (05/09 0700) FiO2 (%):  [28 %-40 %] 40 % (05/09 0810) Weight:  [92.2 kg (203 lb 4.2 oz)] 92.2 kg (203 lb 4.2 oz) (05/09 0500) Last BM Date: 08/28/15  Intake/Output from previous day: 05/08 0701 - 05/09 0700 In: 2780 [I.V.:1220; NG/GT:1560] Out: 3435 [Urine:3435] Intake/Output this shift:    General: No acute distress.   Lungs: Clear to auscultation.  Abd: Soft, good bowel sounds.  Extremities: No movement.  Good pulses.  Neuro: Seems the best that I have seen since admission.  Lab Results: CBC  No results for input(s): WBC, HGB, HCT, PLT in the last 72 hours. BMET No results for input(s): NA, K, CL, CO2, GLUCOSE, BUN, CREATININE, CALCIUM in the last 72 hours. PT/INR No results for input(s): LABPROT, INR in the last 72 hours. ABG No results for input(s): PHART, HCO3 in the last 72 hours.  Invalid input(s): PCO2, PO2  Studies/Results: No results found.  Anti-infectives: Anti-infectives    Start     Dose/Rate Route Frequency Ordered Stop   08/16/15 1000  levofloxacin (LEVAQUIN) IVPB 750 mg     750 mg 100 mL/hr over 90 Minutes Intravenous Every 24 hours 08/16/15 0953 08/25/15 1211   08/15/15 1200  ceFAZolin (ANCEF) IVPB 2g/100 mL premix  Status:  Discontinued     2 g 200 mL/hr over 30 Minutes Intravenous  Once 08/15/15 0858 08/16/15 0944   08/14/15 1200  ceFAZolin (ANCEF) IVPB 2g/100 mL premix  Status:  Discontinued     2 g 200 mL/hr over 30 Minutes Intravenous On call to O.R. 08/13/15 [id number] 08/15/15 0858   08/04/15 1800  vancomycin (VANCOCIN) IVPB 1000 mg/200 mL premix  Status:   Discontinued     1,000 mg 200 mL/hr over 60 Minutes Intravenous Every 8 hours 08/04/15 0906 08/06/15 0817   08/04/15 1000  ceFEPIme (MAXIPIME) 2 g in dextrose 5 % 50 mL IVPB     2 g 100 mL/hr over 30 Minutes Intravenous Every 8 hours 08/04/15 0906 08/13/15 0231   08/04/15 1000  vancomycin (VANCOCIN) 2,000 mg in sodium chloride 0.9 % 500 mL IVPB     2,000 mg 250 mL/hr over 120 Minutes Intravenous  Once 08/04/15 0906 08/04/15 1446   07/31/15 1930  ceFAZolin (ANCEF) IVPB 2g/100 mL premix  Status:  Discontinued     2 g 200 mL/hr over 30 Minutes Intravenous 3 times per day 07/31/15 1923 08/04/15 0858   07/31/15 1715  ceFAZolin (ANCEF) 2-4 GM/100ML-% IVPB  Status:  Discontinued    Comments:  Key, Jennifer   : cabinet override      07/31/15 1715 07/31/15 1923   07/31/15 1436  vancomycin (VANCOCIN) powder  Status:  Discontinued       As needed 07/31/15 1436 07/31/15 1844   07/31/15 1300  bacitracin 50,000 Units in sodium chloride irrigation 0.9 % 500 mL irrigation  Status:  Discontinued       As needed 07/31/15 1353 07/31/15 1844   07/31/15 1240  vancomycin (VANCOCIN) 1000 MG powder  Status:  Discontinued    Comments:  Karmen Stabsatcliff, Esther   :  cabinet override      07/31/15 1240 07/31/15 1248   07/31/15 1231  vancomycin (VANCOCIN) 1000 MG powder    Comments:  Dorinda Hill   : cabinet override      07/31/15 1231 08/01/15 0044      Assessment/Plan: s/p Procedure(s): PERCUTANEOUS ENDOSCOPIC GASTROSTOMY (PEG) PLACEMENT Involve therapies again and consider Rehab.  LOS: 30 days   [name]. William Bon, MD, FACS [phone] Trauma Surgeon 08/29/2015

## 2015-08-29 NOTE — Progress Notes (Signed)
Thank you for consult on Mr. [name]. 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 He is paraplegic and currently at RLOS III per last  therapy note of  5/5.   He is currently not appropriate for CIR level therapy and will likely need LTAC after discharge. Will defer CIR consult for now.

## 2015-08-30 ENCOUNTER — Encounter: Payer: Self-pay | Admitting: Orthopedic Surgery

## 2015-08-30 MED ORDER — LOPERAMIDE HCL 1 MG/5ML PO LIQD
2.0000 mg | ORAL | Status: DC | PRN
  Administered 2015-08-30 – 2015-09-16 (×7): 2 mg
  Filled 2015-08-30 (×8): qty 10

## 2015-08-30 NOTE — Progress Notes (Signed)
Trauma Service Note  Subjective: Patient doing okay, and back on TC, but only tolerated TC 6 hours yesterday.  Objective: Vital signs in last 24 hours: Temp:  [97.4 F (36.3 C)-99.7 F (37.6 C)] 97.4 F (36.3 C) (05/10 0800) Pulse Rate:  [88-121] 99 (05/10 0815) Resp:  [14-35] 26 (05/10 0815) BP: (80-149)/(58-124) 140/83 mmHg (05/10 0800) SpO2:  [93 %-100 %] 100 % (05/10 0815) FiO2 (%):  [35 %-40 %] 35 % (05/10 0815) Weight:  [90.2 kg (198 lb 13.7 oz)] 90.2 kg (198 lb 13.7 oz) (05/10 0500) Last BM Date: 08/29/15  Intake/Output from previous day: 05/09 0701 - 05/10 0700 In: 2988.4 [I.V.:1220; NG/GT:1768.4] Out: 2080 [Urine:2000; Stool:80] Intake/Output this shift: Total I/O In: 110 [I.V.:50; NG/GT:60] Out: -   General: Seems cooperative  Lungs: Clear to auscultation  Abd: Benign.  Tolerating tube feedings  Extremities: No changes.  Will check Doppler study for DVT  Neuro: The same neurologically.  Alert, will follow some commands.  Lab Results: CBC   Recent Labs  08/29/15 1135  WBC 7.8  HGB 10.0*  HCT 32.6*  PLT 236   BMET  Recent Labs  08/29/15 1135  NA 140  K 4.0  CL 103  CO2 27  GLUCOSE 115*  BUN 16  CREATININE 0.47*  CALCIUM 9.1   PT/INR No results for input(s): LABPROT, INR in the last 72 hours. ABG No results for input(s): PHART, HCO3 in the last 72 hours.  Invalid input(s): PCO2, PO2  Studies/Results: No results found.  Anti-infectives: Anti-infectives    Start     Dose/Rate Route Frequency Ordered Stop   08/16/15 1000  levofloxacin (LEVAQUIN) IVPB 750 mg     750 mg 100 mL/hr over 90 Minutes Intravenous Every 24 hours 08/16/15 0953 08/25/15 1211   08/15/15 1200  ceFAZolin (ANCEF) IVPB 2g/100 mL premix  Status:  Discontinued     2 g 200 mL/hr over 30 Minutes Intravenous  Once 08/15/15 0858 08/16/15 0944   08/14/15 1200  ceFAZolin (ANCEF) IVPB 2g/100 mL premix  Status:  Discontinued     2 g 200 mL/hr over 30 Minutes Intravenous  On call to O.R. 08/13/15 1610 08/15/15 0858   08/04/15 1800  vancomycin (VANCOCIN) IVPB 1000 mg/200 mL premix  Status:  Discontinued     1,000 mg 200 mL/hr over 60 Minutes Intravenous Every 8 hours 08/04/15 0906 08/06/15 0817   08/04/15 1000  ceFEPIme (MAXIPIME) 2 g in dextrose 5 % 50 mL IVPB     2 g 100 mL/hr over 30 Minutes Intravenous Every 8 hours 08/04/15 0906 08/13/15 0231   08/04/15 1000  vancomycin (VANCOCIN) 2,000 mg in sodium chloride 0.9 % 500 mL IVPB     2,000 mg 250 mL/hr over 120 Minutes Intravenous  Once 08/04/15 0906 08/04/15 1446   07/31/15 1930  ceFAZolin (ANCEF) IVPB 2g/100 mL premix  Status:  Discontinued     2 g 200 mL/hr over 30 Minutes Intravenous 3 times per day 07/31/15 1923 08/04/15 0858   07/31/15 1715  ceFAZolin (ANCEF) 2-4 GM/100ML-% IVPB  Status:  Discontinued    Comments:  Key, Jennifer   : cabinet override      07/31/15 1715 07/31/15 1923   07/31/15 1436  vancomycin (VANCOCIN) powder  Status:  Discontinued       As needed 07/31/15 1436 07/31/15 1844   07/31/15 1300  bacitracin 50,000 Units in sodium chloride irrigation 0.9 % 500 mL irrigation  Status:  Discontinued  As needed 07/31/15 1353 07/31/15 1844   07/31/15 1240  vancomycin (VANCOCIN) 1000 MG powder  Status:  Discontinued    Comments:  Ratcliff, Esther   : cabinet override      07/31/15 1240 07/31/15 1248   07/31/15 1231  vancomycin (VANCOCIN) 1000 MG powder    Comments:  Dorinda HillKey, Jennifer   : cabinet override      07/31/15 1231 08/01/15 0044      Assessment/Plan: s/p Procedure(s): PERCUTANEOUS ENDOSCOPIC GASTROSTOMY (PEG) PLACEMENT Needs new evaluation for possible CIR  LOS: 31 days   Marta LamasJames O. Gae BonWyatt, III, MD, FACS [phone] Trauma Surgeon 08/30/2015

## 2015-08-31 ENCOUNTER — Inpatient Hospital Stay (HOSPITAL_COMMUNITY): Payer: BLUE CROSS/BLUE SHIELD

## 2015-08-31 DIAGNOSIS — G8220 Paraplegia, unspecified: Secondary | ICD-10-CM

## 2015-08-31 DIAGNOSIS — G8221 Paraplegia, complete: Secondary | ICD-10-CM

## 2015-08-31 DIAGNOSIS — S12501A Unspecified nondisplaced fracture of sixth cervical vertebra, initial encounter for closed fracture: Secondary | ICD-10-CM

## 2015-08-31 DIAGNOSIS — S069X9A Unspecified intracranial injury with loss of consciousness of unspecified duration, initial encounter: Secondary | ICD-10-CM
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
## 2015-08-31 NOTE — Progress Notes (Signed)
VASCULAR LAB PRELIMINARY  PRELIMINARY  PRELIMINARY  PRELIMINARY  Bilateral lower extremity venous duplex completed.     Bilateral:  No evidence of DVT, superficial thrombosis, or Baker's Cyst.   Jenetta Logesami Ameyah Bangura, RVT, RDMS 08/31/2015, 2:19 PM
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
## 2015-08-31 NOTE — Progress Notes (Signed)
Per RN, cuff was left deflated s/p speech therapy.  Pt tol well currently, no distress noted.
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
## 2015-09-01 LAB — BASIC METABOLIC PANEL
Anion gap: 8 (ref 5–15)
BUN: 18 mg/dL (ref 6–20)
CALCIUM: 8.4 mg/dL — AB (ref 8.9–10.3)
CHLORIDE: 109 mmol/L (ref 101–111)
CO2: 25 mmol/L (ref 22–32)
CREATININE: 0.39 mg/dL — AB (ref 0.61–1.24)
GFR calc non Af Amer: >60 mL/min (ref >60)
Glucose, Bld: 105 mg/dL — ABNORMAL HIGH (ref 65–99)
Potassium: 5.4 mmol/L — ABNORMAL HIGH (ref 3.5–5.1)
SODIUM: 142 mmol/L (ref 135–145)

## 2015-09-01 LAB — CBC
HEMATOCRIT: 31.8 % — AB (ref 39.0–52.0)
Hemoglobin: 9.9 g/dL — ABNORMAL LOW (ref 13.0–17.0)
MCH: 30.5 pg (ref 26.0–34.0)
MCHC: 31.1 g/dL (ref 30.0–36.0)
MCV: 97.8 fL (ref 78.0–100.0)
Platelets: 229 10*3/uL (ref 150–400)
RBC: 3.25 MIL/uL — ABNORMAL LOW (ref 4.22–5.81)
RDW: 15.3 % (ref 11.5–15.5)
WBC: 7.1 10*3/uL (ref 4.0–10.5)

## 2015-09-01 MED ORDER — SODIUM CHLORIDE 0.9 % IV SOLN
INTRAVENOUS | Status: DC
  Administered 2015-09-01: 08:00:00 via INTRAVENOUS

## 2015-09-01 MED ORDER — IPRATROPIUM-ALBUTEROL 0.5-2.5 (3) MG/3ML IN SOLN: 3.0000 mL | Freq: Four times a day (QID) | RESPIRATORY_TRACT | Status: DC | PRN
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
## 2015-09-01 NOTE — Progress Notes (Signed)
Pt arrived to unit via bed from Neuro ICU accompanied by RN and NT. Tele to be placed. Pt oriented to unit and aware to call RN or NT if needs arise. Agreeable. Lawson RadarHeather M Tamika Shropshire

## 2015-09-01 NOTE — Progress Notes (Signed)
Trauma Service Note  Subjective: Patient doing okay.  Has stayed off the ventilator for almost 48 hours.  Objective: Vital signs in last 24 hours: Temp:  [98.4 F (36.9 C)-99.7 F (37.6 C)] 99.4 F (37.4 C) (05/12 0352) Pulse Rate:  [93-125] 93 (05/12 0700) Resp:  [18-42] 20 (05/12 0700) BP: (96-177)/(61-111) 142/79 mmHg (05/12 0700) SpO2:  [92 %-99 %] 99 % (05/12 0733) FiO2 (%):  [28 %] 28 % (05/12 0733) Weight:  [87.3 kg (192 lb 7.4 oz)] 87.3 kg (192 lb 7.4 oz) (05/12 0402) Last BM Date: 08/31/15  Intake/Output from previous day: 05/11 0701 - 05/12 0700 In: 3280 [I.V.:1230; NG/GT:2040] Out: 2350 [Urine:2275; Stool:75] Intake/Output this shift:    General: Not responding to me, but apparently very interactive with the nursing staff.  Lungs: Coarse on the right, better on the left.  No wheezing.  Abd: Benign  Extremities: No changes  Neuro: No changes.  Lab Results: CBC   Recent Labs  08/29/15 1135 09/01/15 0600  WBC 7.8 7.1  HGB 10.0* 9.9*  HCT 32.6* 31.8*  PLT 236 229   BMET  Recent Labs  08/29/15 1135 09/01/15 0600  NA 140 142  K 4.0 5.4*  CL 103 109  CO2 27 25  GLUCOSE 115* 105*  BUN 16 18  CREATININE 0.47* 0.39*  CALCIUM 9.1 8.4*   PT/INR No results for input(s): LABPROT, INR in the last 72 hours. ABG No results for input(s): PHART, HCO3 in the last 72 hours.  Invalid input(s): PCO2, PO2  Studies/Results: No results found.  Anti-infectives: Anti-infectives    Start     Dose/Rate Route Frequency Ordered Stop   08/16/15 1000  levofloxacin (LEVAQUIN) IVPB 750 mg     750 mg 100 mL/hr over 90 Minutes Intravenous Every 24 hours 08/16/15 0953 08/25/15 1211   08/15/15 1200  ceFAZolin (ANCEF) IVPB 2g/100 mL premix  Status:  Discontinued     2 g 200 mL/hr over 30 Minutes Intravenous  Once 08/15/15 0858 08/16/15 0944   08/14/15 1200  ceFAZolin (ANCEF) IVPB 2g/100 mL premix  Status:  Discontinued     2 g 200 mL/hr over 30 Minutes  Intravenous On call to O.R. 08/13/15 [id number] 08/15/15 0858   08/04/15 1800  vancomycin (VANCOCIN) IVPB 1000 mg/200 mL premix  Status:  Discontinued     1,000 mg 200 mL/hr over 60 Minutes Intravenous Every 8 hours 08/04/15 0906 08/06/15 0817   08/04/15 1000  ceFEPIme (MAXIPIME) 2 g in dextrose 5 % 50 mL IVPB     2 g 100 mL/hr over 30 Minutes Intravenous Every 8 hours 08/04/15 0906 08/13/15 0231   08/04/15 1000  vancomycin (VANCOCIN) 2,000 mg in sodium chloride 0.9 % 500 mL IVPB     2,000 mg 250 mL/hr over 120 Minutes Intravenous  Once 08/04/15 0906 08/04/15 1446   07/31/15 1930  ceFAZolin (ANCEF) IVPB 2g/100 mL premix  Status:  Discontinued     2 g 200 mL/hr over 30 Minutes Intravenous 3 times per day 07/31/15 1923 08/04/15 0858   07/31/15 1715  ceFAZolin (ANCEF) 2-4 GM/100ML-% IVPB  Status:  Discontinued    Comments:  Key, Jennifer   : cabinet override      07/31/15 1715 07/31/15 1923   07/31/15 1436  vancomycin (VANCOCIN) powder  Status:  Discontinued       As needed 07/31/15 1436 07/31/15 1844   07/31/15 1300  bacitracin 50,000 Units in sodium chloride irrigation 0.9 % 500 mL irrigation  Status:  Discontinued       As needed 07/31/15 1353 07/31/15 1844   07/31/15 1240  vancomycin (VANCOCIN) 1000 MG powder  Status:  Discontinued    Comments:  Ratcliff, Esther   : cabinet override      07/31/15 1240 07/31/15 1248   07/31/15 1231  vancomycin (VANCOCIN) 1000 MG powder    Comments:  Dorinda Hill   : cabinet override      07/31/15 1231 08/01/15 0044      Assessment/Plan: s/p Procedure(s): PERCUTANEOUS ENDOSCOPIC GASTROSTOMY (PEG) PLACEMENT Transfer to [facility] Neurosurgery unti with tele  Hyperkalemia.  Will changed IVFs.  Recheck Bmet tomorrow AM  LOS: 33 days   Marta Lamas. Gae Bon, MD, FACS [phone] Trauma Surgeon 09/01/2015
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
## 2015-09-01 NOTE — Progress Notes (Signed)
Pt on 28% trach collar since Wednesday, May 10.  Plan transfer to neuro floor when bed available.  Pt making good progress with PT/OT; CIR following for possible admission upon medical stability.  Will follow progress.    Quintella BatonJulie W. Zsofia Prout, RN, BSN  Trauma/Neuro ICU Case Manager [phone]
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
## 2015-09-02 LAB — BASIC METABOLIC PANEL
Anion gap: 11 (ref 5–15)
BUN: 19 mg/dL (ref 6–20)
CALCIUM: 9.1 mg/dL (ref 8.9–10.3)
CHLORIDE: 103 mmol/L (ref 101–111)
CO2: 28 mmol/L (ref 22–32)
CREATININE: 0.49 mg/dL — AB (ref 0.61–1.24)
GFR calc Af Amer: >60 mL/min (ref >60)
GFR calc non Af Amer: >60 mL/min (ref >60)
GLUCOSE: 99 mg/dL (ref 65–99)
Potassium: 3.7 mmol/L (ref 3.5–5.1)
Sodium: 142 mmol/L (ref 135–145)

## 2015-09-02 NOTE — Progress Notes (Signed)
RT unavailable to assess pt for 1600 check due to code blue, pt intubation, and emergent trip to IR. 
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
## 2015-09-03 NOTE — Progress Notes (Signed)
Trauma Service Note  Subjective: Patient stares at you, will not attempt to mouth words or communicate.  Tracks you closely.  Very anxious  Objective: Vital signs in last 24 hours: Temp:  [97.5 F (36.4 C)-99.8 F (37.7 C)] 99.2 F (37.3 C) (05/14 0504) Pulse Rate:  [97-115] 106 (05/14 0805) Resp:  [18-200] 19 (05/14 0805) BP: (115-143)/(75-91) 130/75 mmHg (05/14 0504) SpO2:  [92 %-97 %] 97 % (05/14 0805) FiO2 (%):  [28 %] 28 % (05/14 0805) Weight:  [88.406 kg (194 lb 14.4 oz)] 88.406 kg (194 lb 14.4 oz) (05/14 0500) Last BM Date: 09/02/15  Intake/Output from previous day: 05/13 0701 - 05/14 0700 In: -  Out: 2000 [Urine:2000] Intake/Output this shift:    General: Anxious, no distress  Lungs: Clear to auscultation.  Sats are good.  Abd: Tolerating tube feedings.  Benign otherwise.  Extremities: No swelling  Neuro: The same.  Lab Results: CBC   Recent Labs  09/01/15 0600  WBC 7.1  HGB 9.9*  HCT 31.8*  PLT 229   BMET  Recent Labs  09/01/15 0600 09/02/15 0410  NA 142 142  K 5.4* 3.7  CL 109 103  CO2 25 28  GLUCOSE 105* 99  BUN 18 19  CREATININE 0.39* 0.49*  CALCIUM 8.4* 9.1   PT/INR No results for input(s): LABPROT, INR in the last 72 hours. ABG No results for input(s): PHART, HCO3 in the last 72 hours.  Invalid input(s): PCO2, PO2  Studies/Results: No results found.  Anti-infectives: Anti-infectives    Start     Dose/Rate Route Frequency Ordered Stop   08/16/15 1000  levofloxacin (LEVAQUIN) IVPB 750 mg     750 mg 100 mL/hr over 90 Minutes Intravenous Every 24 hours 08/16/15 0953 08/25/15 1211   08/15/15 1200  ceFAZolin (ANCEF) IVPB 2g/100 mL premix  Status:  Discontinued     2 g 200 mL/hr over 30 Minutes Intravenous  Once 08/15/15 0858 08/16/15 0944   08/14/15 1200  ceFAZolin (ANCEF) IVPB 2g/100 mL premix  Status:  Discontinued     2 g 200 mL/hr over 30 Minutes Intravenous On call to O.R. 08/13/15 [id number] 08/15/15 0858   08/04/15 1800   vancomycin (VANCOCIN) IVPB 1000 mg/200 mL premix  Status:  Discontinued     1,000 mg 200 mL/hr over 60 Minutes Intravenous Every 8 hours 08/04/15 0906 08/06/15 0817   08/04/15 1000  ceFEPIme (MAXIPIME) 2 g in dextrose 5 % 50 mL IVPB     2 g 100 mL/hr over 30 Minutes Intravenous Every 8 hours 08/04/15 0906 08/13/15 0231   08/04/15 1000  vancomycin (VANCOCIN) 2,000 mg in sodium chloride 0.9 % 500 mL IVPB     2,000 mg 250 mL/hr over 120 Minutes Intravenous  Once 08/04/15 0906 08/04/15 1446   07/31/15 1930  ceFAZolin (ANCEF) IVPB 2g/100 mL premix  Status:  Discontinued     2 g 200 mL/hr over 30 Minutes Intravenous 3 times per day 07/31/15 1923 08/04/15 0858   07/31/15 1715  ceFAZolin (ANCEF) 2-4 GM/100ML-% IVPB  Status:  Discontinued    Comments:  Key, Jennifer   : cabinet override      07/31/15 1715 07/31/15 1923   07/31/15 1436  vancomycin (VANCOCIN) powder  Status:  Discontinued       As needed 07/31/15 1436 07/31/15 1844   07/31/15 1300  bacitracin 50,000 Units in sodium chloride irrigation 0.9 % 500 mL irrigation  Status:  Discontinued       As needed 07/31/15  1353 07/31/15 1844   07/31/15 1240  vancomycin (VANCOCIN) 1000 MG powder  Status:  Discontinued    Comments:  Ratcliff, Esther   : cabinet override      07/31/15 1240 07/31/15 1248   07/31/15 1231  vancomycin (VANCOCIN) 1000 MG powder    Comments:  Dorinda Hill   : cabinet override      07/31/15 1231 08/01/15 0044      Assessment/Plan: s/p Procedure(s): PERCUTANEOUS ENDOSCOPIC GASTROSTOMY (PEG) PLACEMENT Consider Rehab at the appropriat time.  LOS: 35 days   Marta Lamas. Gae Bon, MD, FACS [phone] Trauma Surgeon 09/03/2015

## 2015-09-03 NOTE — Progress Notes (Signed)
In and out catherized due to inability to void; 850 ml residual urine obtained using sterile tech.

## 2015-09-04 MED ORDER — AMANTADINE HCL 50 MG/5ML PO SYRP
200.0000 mg | ORAL_SOLUTION | Freq: Two times a day (BID) | ORAL | Status: DC
  Administered 2015-09-04 – 2015-09-11 (×15): 200 mg
  Filled 2015-09-04 (×15): qty 20

## 2015-09-04 MED ORDER — BETHANECHOL CHLORIDE 25 MG PO TABS
25.0000 mg | ORAL_TABLET | Freq: Four times a day (QID) | ORAL | Status: DC
  Administered 2015-09-04 – 2015-09-11 (×29): 25 mg
  Filled 2015-09-04 (×29): qty 1

## 2015-09-04 MED ORDER — HYDROCODONE-ACETAMINOPHEN 7.5-325 MG/15ML PO SOLN
10.0000 mL | ORAL | Status: DC | PRN
  Administered 2015-09-04 – 2015-09-12 (×11): 15 mL via ORAL
  Administered 2015-09-12: 20 mL via ORAL
  Administered 2015-09-13 – 2015-09-20 (×8): 15 mL via ORAL
  Filled 2015-09-04 (×12): qty 15
  Filled 2015-09-04: qty 30
  Filled 2015-09-04 (×3): qty 15
  Filled 2015-09-04: qty 30
  Filled 2015-09-04 (×2): qty 15
  Filled 2015-09-04: qty 30

## 2015-09-04 MED ORDER — MORPHINE SULFATE (PF) 2 MG/ML IV SOLN
2.0000 mg | INTRAVENOUS | Status: DC | PRN
  Filled 2015-09-04: qty 1

## 2015-09-04 MED ORDER — QUETIAPINE FUMARATE 200 MG PO TABS
200.0000 mg | ORAL_TABLET | Freq: Two times a day (BID) | ORAL | Status: DC
  Administered 2015-09-04 – 2015-09-08 (×9): 200 mg
  Filled 2015-09-04 (×9): qty 1
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
## 2015-09-04 NOTE — Progress Notes (Signed)
Rehab admissions - Patient asleep when I rounded.  Nurse reports that patient does not speak or follow commands for her.  Noted patient does track and did follow simple one step commands for OT today.  Please see rehab consult done by Dr. Wynn BankerKirsteins recommending remain on acute care.  Not ready yet for inpatient rehab.  Will need to be more fully participatory with therapies.  [facility] also [facility]  I will follow progress for now.  Call me for questions.  #[phone]

## 2015-09-04 NOTE — Progress Notes (Signed)
PT/OT Cancellation Note  Patient Details Name: Julien NordmannDonald Wayne Spear MRN: [id number] DOB: [birth year]   Cancelled Treatment:    Reason Eval/Treat Not Completed: Fatigue/lethargy limiting ability to participate. PT/OT returned to complete [name] as it was unable to be completed on Friday 5/12 however pt just received seraquel and is now with increased lethargy. [name] to be done next PT/OT session.   Marcene BrawnChadwell, Deane Wattenbarger Marie 09/04/2015, 11:21 AM  Lewis ShockAshly Shailey Butterbaugh, PT, DPT Pager #: [phone] Office #: [phone]

## 2015-09-05 LAB — BASIC METABOLIC PANEL
Anion gap: 11 (ref 5–15)
BUN: 15 mg/dL (ref 6–20)
CALCIUM: 9.3 mg/dL (ref 8.9–10.3)
CO2: 28 mmol/L (ref 22–32)
Chloride: 103 mmol/L (ref 101–111)
Creatinine, Ser: 0.5 mg/dL — ABNORMAL LOW (ref 0.61–1.24)
GFR calc Af Amer: >60 mL/min (ref >60)
GFR calc non Af Amer: >60 mL/min (ref >60)
GLUCOSE: 94 mg/dL (ref 65–99)
Potassium: 3.8 mmol/L (ref 3.5–5.1)
Sodium: 142 mmol/L (ref 135–145)

## 2015-09-05 LAB — CBC
HEMATOCRIT: 35.2 % — AB (ref 39.0–52.0)
HEMOGLOBIN: 11.4 g/dL — AB (ref 13.0–17.0)
MCH: 30.6 pg (ref 26.0–34.0)
MCHC: 32.4 g/dL (ref 30.0–36.0)
MCV: 94.6 fL (ref 78.0–100.0)
PLATELETS: 265 10*3/uL (ref 150–400)
RBC: 3.72 MIL/uL — ABNORMAL LOW (ref 4.22–5.81)
RDW: 14.9 % (ref 11.5–15.5)
WBC: 6.8 10*3/uL (ref 4.0–10.5)

## 2015-09-05 MED ORDER — PIVOT 1.5 CAL PO LIQD
1000.0000 mL | ORAL | Status: DC
  Administered 2015-09-05 – 2015-09-18 (×14): 1000 mL
  Filled 2015-09-05 (×28): qty 1000
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
## 2015-09-05 NOTE — Progress Notes (Signed)
Trauma Service Note  Subjective: Patient is doing okay, but not really following commands and somewhat agitated. Pulling at clothing consatantly  Objective: Vital signs in last 24 hours: Temp:  [98.1 F (36.7 C)-98.7 F (37.1 C)] 98.7 F (37.1 C) (05/16 0521) Pulse Rate:  [82-106] 90 (05/16 0724) Resp:  [16-22] 22 (05/16 0724) BP: (108-183)/(75-92) 183/92 mmHg (05/16 0521) SpO2:  [95 %-100 %] 99 % (05/16 0724) FiO2 (%):  [28 %] 28 % (05/16 0724) Weight:  [88.089 kg (194 lb 3.2 oz)] 88.089 kg (194 lb 3.2 oz) (05/16 0500) Last BM Date: 09/02/15  Intake/Output from previous day: 05/15 0701 - 05/16 0700 In: 4760 [I.V.:20; NG/GT:4740] Out: 2400 [Urine:1900; Stool:500] Intake/Output this shift:    General: No acute distress, agitated.  All labs are okay.  Lungs: Clear.  Sats are good.  Abd: Benign.  Toleratign tube feedings  Extremities: No changes  Neuro: Pupils good.   Agitated and not really following commands.  Rancho III seems appropriate.  Lab Results: CBC   Recent Labs  09/05/15 0400  WBC 6.8  HGB 11.4*  HCT 35.2*  PLT 265   BMET  Recent Labs  09/05/15 0400  NA 142  K 3.8  CL 103  CO2 28  GLUCOSE 94  BUN 15  CREATININE 0.50*  CALCIUM 9.3   PT/INR No results for input(s): LABPROT, INR in the last 72 hours. ABG No results for input(s): PHART, HCO3 in the last 72 hours.  Invalid input(s): PCO2, PO2  Studies/Results: No results found.  Anti-infectives: Anti-infectives    Start     Dose/Rate Route Frequency Ordered Stop   08/16/15 1000  levofloxacin (LEVAQUIN) IVPB 750 mg     750 mg 100 mL/hr over 90 Minutes Intravenous Every 24 hours 08/16/15 0953 08/25/15 1211   08/15/15 1200  ceFAZolin (ANCEF) IVPB 2g/100 mL premix  Status:  Discontinued     2 g 200 mL/hr over 30 Minutes Intravenous  Once 08/15/15 0858 08/16/15 0944   08/14/15 1200  ceFAZolin (ANCEF) IVPB 2g/100 mL premix  Status:  Discontinued     2 g 200 mL/hr over 30 Minutes  Intravenous On call to O.R. 08/13/15 6962 08/15/15 0858   08/04/15 1800  vancomycin (VANCOCIN) IVPB 1000 mg/200 mL premix  Status:  Discontinued     1,000 mg 200 mL/hr over 60 Minutes Intravenous Every 8 hours 08/04/15 0906 08/06/15 0817   08/04/15 1000  ceFEPIme (MAXIPIME) 2 g in dextrose 5 % 50 mL IVPB     2 g 100 mL/hr over 30 Minutes Intravenous Every 8 hours 08/04/15 0906 08/13/15 0231   08/04/15 1000  vancomycin (VANCOCIN) 2,000 mg in sodium chloride 0.9 % 500 mL IVPB     2,000 mg 250 mL/hr over 120 Minutes Intravenous  Once 08/04/15 0906 08/04/15 1446   07/31/15 1930  ceFAZolin (ANCEF) IVPB 2g/100 mL premix  Status:  Discontinued     2 g 200 mL/hr over 30 Minutes Intravenous 3 times per day 07/31/15 1923 08/04/15 0858   07/31/15 1715  ceFAZolin (ANCEF) 2-4 GM/100ML-% IVPB  Status:  Discontinued    Comments:  Key, Jennifer   : cabinet override      07/31/15 1715 07/31/15 1923   07/31/15 1436  vancomycin (VANCOCIN) powder  Status:  Discontinued       As needed 07/31/15 1436 07/31/15 1844   07/31/15 1300  bacitracin 50,000 Units in sodium chloride irrigation 0.9 % 500 mL irrigation  Status:  Discontinued  As needed 07/31/15 1353 07/31/15 1844   07/31/15 1240  vancomycin (VANCOCIN) 1000 MG powder  Status:  Discontinued    Comments:  Ratcliff, Esther   : cabinet override      07/31/15 1240 07/31/15 1248   07/31/15 1231  vancomycin (VANCOCIN) 1000 MG powder    Comments:  Dorinda HillKey, Jennifer   : cabinet override      07/31/15 1231 08/01/15 0044      Assessment/Plan: s/p Procedure(s): PERCUTANEOUS ENDOSCOPIC GASTROSTOMY (PEG) PLACEMENT Disposition concerns.  Likely to need SNF palcement  LOS: 37 days   Marta LamasJames O. Gae BonWyatt, III, MD, FACS [phone] Trauma Surgeon 09/05/2015

## 2015-09-06 LAB — URINALYSIS, ROUTINE W REFLEX MICROSCOPIC
Bilirubin Urine: NEGATIVE
GLUCOSE, UA: NEGATIVE mg/dL
KETONES UR: NEGATIVE mg/dL
Nitrite: POSITIVE — AB
PROTEIN: 30 mg/dL — AB
Specific Gravity, Urine: 1.018 (ref 1.005–1.030)
pH: 7.5 (ref 5.0–8.0)

## 2015-09-06 LAB — URINE MICROSCOPIC-ADD ON: Squamous Epithelial / LPF: NONE SEEN

## 2015-09-06 MED ORDER — HYDRALAZINE HCL 20 MG/ML IJ SOLN
10.0000 mg | Freq: Four times a day (QID) | INTRAMUSCULAR | Status: DC | PRN
  Administered 2015-09-06: 10 mg via INTRAVENOUS
  Filled 2015-09-06: qty 1

## 2015-09-06 MED ORDER — METOPROLOL TARTRATE 25 MG/10 ML ORAL SUSPENSION
50.0000 mg | Freq: Two times a day (BID) | ORAL | Status: DC
  Administered 2015-09-06 – 2015-09-18 (×24): 50 mg
  Filled 2015-09-06 (×25): qty 20

## 2015-09-06 NOTE — Progress Notes (Signed)
Trauma Service Note  Subjective: Patient doing okay, but hypertensive.  Very agitated this AM.    Objective: Vital signs in last 24 hours: Temp:  [97.9 F (36.6 C)-98.8 F (37.1 C)] 98.4 F (36.9 C) (05/17 0557) Pulse Rate:  [92-111] 99 (05/17 0906) Resp:  [20-22] 22 (05/17 0906) BP: (128-193)/(79-104) 193/104 mmHg (05/17 0557) SpO2:  [96 %-99 %] 99 % (05/17 0906) FiO2 (%):  [21 %-28 %] 21 % (05/17 0906) Weight:  [86.864 kg (191 lb 8 oz)] 86.864 kg (191 lb 8 oz) (05/17 0500) Last BM Date: 09/05/15 (flexaseal)  Intake/Output from previous day: 05/16 0701 - 05/17 0700 In: -  Out: 2550 [Urine:2550] Intake/Output this shift:    General: Agitated, no acute problems  Lungs: Clear  Abd: Benign  Extremities: No changes  Neuro: Same   Lab Results: CBC   Recent Labs  09/05/15 0400  WBC 6.8  HGB 11.4*  HCT 35.2*  PLT 265   BMET  Recent Labs  09/05/15 0400  NA 142  K 3.8  CL 103  CO2 28  GLUCOSE 94  BUN 15  CREATININE 0.50*  CALCIUM 9.3   PT/INR No results for input(s): LABPROT, INR in the last 72 hours. ABG No results for input(s): PHART, HCO3 in the last 72 hours.  Invalid input(s): PCO2, PO2  Studies/Results: No results found.  Anti-infectives: Anti-infectives    Start     Dose/Rate Route Frequency Ordered Stop   08/16/15 1000  levofloxacin (LEVAQUIN) IVPB 750 mg     750 mg 100 mL/hr over 90 Minutes Intravenous Every 24 hours 08/16/15 0953 08/25/15 1211   08/15/15 1200  ceFAZolin (ANCEF) IVPB 2g/100 mL premix  Status:  Discontinued     2 g 200 mL/hr over 30 Minutes Intravenous  Once 08/15/15 0858 08/16/15 0944   08/14/15 1200  ceFAZolin (ANCEF) IVPB 2g/100 mL premix  Status:  Discontinued     2 g 200 mL/hr over 30 Minutes Intravenous On call to O.R. 08/13/15 [id number] 08/15/15 0858   08/04/15 1800  vancomycin (VANCOCIN) IVPB 1000 mg/200 mL premix  Status:  Discontinued     1,000 mg 200 mL/hr over 60 Minutes Intravenous Every 8 hours 08/04/15 0906  08/06/15 0817   08/04/15 1000  ceFEPIme (MAXIPIME) 2 g in dextrose 5 % 50 mL IVPB     2 g 100 mL/hr over 30 Minutes Intravenous Every 8 hours 08/04/15 0906 08/13/15 0231   08/04/15 1000  vancomycin (VANCOCIN) 2,000 mg in sodium chloride 0.9 % 500 mL IVPB     2,000 mg 250 mL/hr over 120 Minutes Intravenous  Once 08/04/15 0906 08/04/15 1446   07/31/15 1930  ceFAZolin (ANCEF) IVPB 2g/100 mL premix  Status:  Discontinued     2 g 200 mL/hr over 30 Minutes Intravenous 3 times per day 07/31/15 1923 08/04/15 0858   07/31/15 1715  ceFAZolin (ANCEF) 2-4 GM/100ML-% IVPB  Status:  Discontinued    Comments:  Key, Jennifer   : cabinet override      07/31/15 1715 07/31/15 1923   07/31/15 1436  vancomycin (VANCOCIN) powder  Status:  Discontinued       As needed 07/31/15 1436 07/31/15 1844   07/31/15 1300  bacitracin 50,000 Units in sodium chloride irrigation 0.9 % 500 mL irrigation  Status:  Discontinued       As needed 07/31/15 1353 07/31/15 1844   07/31/15 1240  vancomycin (VANCOCIN) 1000 MG powder  Status:  Discontinued    Comments:  Karmen Stabsatcliff, Esther   :  cabinet override      07/31/15 1240 07/31/15 1248   07/31/15 1231  vancomycin (VANCOCIN) 1000 MG powder    Comments:  Dorinda Hill   : cabinet override      07/31/15 1231 08/01/15 0044      Assessment/Plan: s/p Procedure(s): PERCUTANEOUS ENDOSCOPIC GASTROSTOMY (PEG) PLACEMENT Disposition  Additional medications for hypertension  LOS: 38 days   Marta Lamas. Gae Bon, MD, FACS [phone] Trauma Surgeon 09/06/2015
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
## 2015-09-07 LAB — GLUCOSE, CAPILLARY
GLUCOSE-CAPILLARY: 104 mg/dL — AB (ref 65–99)
Glucose-Capillary: 144 mg/dL — ABNORMAL HIGH (ref 65–99)
Glucose-Capillary: 94 mg/dL (ref 65–99)

## 2015-09-07 MED ORDER — SULFAMETHOXAZOLE-TRIMETHOPRIM 200-40 MG/5ML PO SUSP
20.0000 mL | Freq: Two times a day (BID) | ORAL | Status: DC
Start: 2015-09-07 — End: 2015-09-11
  Administered 2015-09-07 – 2015-09-11 (×9): 20 mL
  Filled 2015-09-07 (×9): qty 20

## 2015-09-07 NOTE — Progress Notes (Signed)
Rehab admissions - Noted OT and ST documenting ranchos level V.  Patient still agitated/restless.  Not able to sit up in chair yet. Anticipate that patient will need SNF even after a potential inpatient rehab admission.  Following progress.  Call me for questions.  #[phone]

## 2015-09-07 NOTE — Progress Notes (Signed)
Trauma Service Note  Subjective: Patient responding but still very agitated.  Has a urinary infection.  Objective: Vital signs in last 24 hours: Temp:  [98 F (36.7 C)-98.5 F (36.9 C)] 98.1 F (36.7 C) (05/18 0422) Pulse Rate:  [78-109] 88 (05/18 0843) Resp:  [18-22] 18 (05/18 0843) BP: (117-207)/(80-111) 152/80 mmHg (05/18 0422) SpO2:  [95 %-99 %] 98 % (05/18 0843) FiO2 (%):  [21 %-28 %] 28 % (05/18 0843) Weight:  [86.909 kg (191 lb 9.6 oz)] 86.909 kg (191 lb 9.6 oz) (05/18 0556) Last BM Date: 09/06/15  Intake/Output from previous day: 05/17 0701 - 05/18 0700 In: 130 [I.V.:30] Out: 2750 [Urine:1950; Stool:800] Intake/Output this shift:    General: Agitated, somewhat responsive.    Lungs: Clear  Abd: Good bowel sounds.  Still with significant diarrhea  Extremities: No changes  Neuro: Intact  Lab Results: CBC   Recent Labs  09/05/15 0400  WBC 6.8  HGB 11.4*  HCT 35.2*  PLT 265   BMET  Recent Labs  09/05/15 0400  NA 142  K 3.8  CL 103  CO2 28  GLUCOSE 94  BUN 15  CREATININE 0.50*  CALCIUM 9.3   PT/INR No results for input(s): LABPROT, INR in the last 72 hours. ABG No results for input(s): PHART, HCO3 in the last 72 hours.  Invalid input(s): PCO2, PO2  Studies/Results: No results found.  Anti-infectives: Anti-infectives    Start     Dose/Rate Route Frequency Ordered Stop   08/16/15 1000  levofloxacin (LEVAQUIN) IVPB 750 mg     750 mg 100 mL/hr over 90 Minutes Intravenous Every 24 hours 08/16/15 0953 08/25/15 1211   08/15/15 1200  ceFAZolin (ANCEF) IVPB 2g/100 mL premix  Status:  Discontinued     2 g 200 mL/hr over 30 Minutes Intravenous  Once 08/15/15 0858 08/16/15 0944   08/14/15 1200  ceFAZolin (ANCEF) IVPB 2g/100 mL premix  Status:  Discontinued     2 g 200 mL/hr over 30 Minutes Intravenous On call to O.R. 08/13/15 [id number] 08/15/15 0858   08/04/15 1800  vancomycin (VANCOCIN) IVPB 1000 mg/200 mL premix  Status:  Discontinued     1,000  mg 200 mL/hr over 60 Minutes Intravenous Every 8 hours 08/04/15 0906 08/06/15 0817   08/04/15 1000  ceFEPIme (MAXIPIME) 2 g in dextrose 5 % 50 mL IVPB     2 g 100 mL/hr over 30 Minutes Intravenous Every 8 hours 08/04/15 0906 08/13/15 0231   08/04/15 1000  vancomycin (VANCOCIN) 2,000 mg in sodium chloride 0.9 % 500 mL IVPB     2,000 mg 250 mL/hr over 120 Minutes Intravenous  Once 08/04/15 0906 08/04/15 1446   07/31/15 1930  ceFAZolin (ANCEF) IVPB 2g/100 mL premix  Status:  Discontinued     2 g 200 mL/hr over 30 Minutes Intravenous 3 times per day 07/31/15 1923 08/04/15 0858   07/31/15 1715  ceFAZolin (ANCEF) 2-4 GM/100ML-% IVPB  Status:  Discontinued    Comments:  Key, Jennifer   : cabinet override      07/31/15 1715 07/31/15 1923   07/31/15 1436  vancomycin (VANCOCIN) powder  Status:  Discontinued       As needed 07/31/15 1436 07/31/15 1844   07/31/15 1300  bacitracin 50,000 Units in sodium chloride irrigation 0.9 % 500 mL irrigation  Status:  Discontinued       As needed 07/31/15 1353 07/31/15 1844   07/31/15 1240  vancomycin (VANCOCIN) 1000 MG powder  Status:  Discontinued  Comments:  Karmen Stabs   : cabinet override      07/31/15 1240 07/31/15 1248   07/31/15 1231  vancomycin (VANCOCIN) 1000 MG powder    Comments:  Dorinda Hill   : cabinet override      07/31/15 1231 08/01/15 0044      Assessment/Plan: s/p Procedure(s): PERCUTANEOUS ENDOSCOPIC GASTROSTOMY (PEG) PLACEMENT Urinary tract infection.  Will start Bactrim per tube  LOS: 39 days   Marta Lamas. Gae Bon, MD, FACS [phone] Trauma Surgeon 09/07/2015
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
## 2015-09-07 NOTE — Progress Notes (Signed)
Patients rectal tube started leaking checked placement making sure bulb was inflated with 45 ml of water, appears that patient is loosing muscle tension in rectum and when he coughs liquid stool goes past tube. Also cleaned and changed foam pad on pressure ulcer which does not appear to be healing very well.

## 2015-09-08 LAB — GLUCOSE, CAPILLARY
GLUCOSE-CAPILLARY: 117 mg/dL — AB (ref 65–99)
Glucose-Capillary: 112 mg/dL — ABNORMAL HIGH (ref 65–99)
Glucose-Capillary: 112 mg/dL — ABNORMAL HIGH (ref 65–99)

## 2015-09-08 MED ORDER — QUETIAPINE FUMARATE 50 MG PO TABS
100.0000 mg | ORAL_TABLET | Freq: Three times a day (TID) | ORAL | Status: DC
  Administered 2015-09-08 (×2): 100 mg
  Filled 2015-09-08 (×2): qty 2

## 2015-09-08 MED ORDER — COLLAGENASE 250 UNIT/GM EX OINT
TOPICAL_OINTMENT | Freq: Every day | CUTANEOUS | Status: DC
  Administered 2015-09-08 – 2015-09-20 (×12): via TOPICAL
  Filled 2015-09-08 (×2): qty 30

## 2015-09-08 MED ORDER — CLONAZEPAM 1 MG PO TABS
2.0000 mg | ORAL_TABLET | Freq: Two times a day (BID) | ORAL | Status: DC
  Administered 2015-09-08: 2 mg
  Filled 2015-09-08: qty 2
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
## 2015-09-08 NOTE — Progress Notes (Signed)
Trauma Service Note  Subjective: Patient not doing much more than yesterday.  Objective: Vital signs in last 24 hours: Temp:  [97.7 F (36.5 C)-99.3 F (37.4 C)] 98 F (36.7 C) (05/19 0422) Pulse Rate:  [82-107] 82 (05/19 0726) Resp:  [16-20] 18 (05/19 0726) BP: (111-146)/(64-88) 111/64 mmHg (05/19 0422) SpO2:  [96 %-100 %] 97 % (05/19 0726) FiO2 (%):  [28 %] 28 % (05/19 0726) Weight:  [88.86 kg (195 lb 14.4 oz)] 88.86 kg (195 lb 14.4 oz) (05/19 0500) Last BM Date: 09/07/15  Intake/Output from previous day: 05/18 0701 - 05/19 0700 In: 10 [I.V.:10] Out: 1746 [Urine:1746] Intake/Output this shift:    General: No distress.  Lungs: Clear  Abd: Benign, diarrhea, leaking from Flexiseal  Extremities: No changes  Neuro: Same deficits  Skin:  Stage III decubitus ulcer, may need debridement, will get wound care consultation.      Lab Results: CBC  No results for input(s): WBC, HGB, HCT, PLT in the last 72 hours. BMET No results for input(s): NA, K, CL, CO2, GLUCOSE, BUN, CREATININE, CALCIUM in the last 72 hours. PT/INR No results for input(s): LABPROT, INR in the last 72 hours. ABG No results for input(s): PHART, HCO3 in the last 72 hours.  Invalid input(s): PCO2, PO2  Studies/Results: No results found.  Anti-infectives: Anti-infectives    Start     Dose/Rate Route Frequency Ordered Stop   09/07/15 1000  sulfamethoxazole-trimethoprim (BACTRIM,SEPTRA) 200-40 MG/5ML suspension 20 mL     20 mL Per Tube Every 12 hours 09/07/15 0922     08/16/15 1000  levofloxacin (LEVAQUIN) IVPB 750 mg     750 mg 100 mL/hr over 90 Minutes Intravenous Every 24 hours 08/16/15 0953 08/25/15 1211   08/15/15 1200  ceFAZolin (ANCEF) IVPB 2g/100 mL premix  Status:  Discontinued     2 g 200 mL/hr over 30 Minutes Intravenous  Once 08/15/15 0858 08/16/15 0944   08/14/15 1200  ceFAZolin (ANCEF) IVPB 2g/100 mL premix  Status:  Discontinued     2 g 200 mL/hr over 30 Minutes Intravenous On  call to O.R. 08/13/15 1610 08/15/15 0858   08/04/15 1800  vancomycin (VANCOCIN) IVPB 1000 mg/200 mL premix  Status:  Discontinued     1,000 mg 200 mL/hr over 60 Minutes Intravenous Every 8 hours 08/04/15 0906 08/06/15 0817   08/04/15 1000  ceFEPIme (MAXIPIME) 2 g in dextrose 5 % 50 mL IVPB     2 g 100 mL/hr over 30 Minutes Intravenous Every 8 hours 08/04/15 0906 08/13/15 0231   08/04/15 1000  vancomycin (VANCOCIN) 2,000 mg in sodium chloride 0.9 % 500 mL IVPB     2,000 mg 250 mL/hr over 120 Minutes Intravenous  Once 08/04/15 0906 08/04/15 1446   07/31/15 1930  ceFAZolin (ANCEF) IVPB 2g/100 mL premix  Status:  Discontinued     2 g 200 mL/hr over 30 Minutes Intravenous 3 times per day 07/31/15 1923 08/04/15 0858   07/31/15 1715  ceFAZolin (ANCEF) 2-4 GM/100ML-% IVPB  Status:  Discontinued    Comments:  Key, Jennifer   : cabinet override      07/31/15 1715 07/31/15 1923   07/31/15 1436  vancomycin (VANCOCIN) powder  Status:  Discontinued       As needed 07/31/15 1436 07/31/15 1844   07/31/15 1300  bacitracin 50,000 Units in sodium chloride irrigation 0.9 % 500 mL irrigation  Status:  Discontinued       As needed 07/31/15 1353 07/31/15 1844   07/31/15  1240  vancomycin (VANCOCIN) 1000 MG powder  Status:  Discontinued    Comments:  Lannie FieldsRatcliff, Esther   : cabinet override      07/31/15 1240 07/31/15 1248   07/31/15 1231  vancomycin (VANCOCIN) 1000 MG powder    Comments:  Dorinda HillKey, Jennifer   : cabinet override      07/31/15 1231 08/01/15 0044      Assessment/Plan: s/p Procedure(s): PERCUTANEOUS ENDOSCOPIC GASTROSTOMY (PEG) PLACEMENT Now has Stage III decubitus ulcer with some Santyl on top.  Wound care consultation  LOS: 40 days   Marta LamasJames O. Gae BonWyatt, III, MD, FACS [phone] Trauma Surgeon 09/08/2015

## 2015-09-09 LAB — BASIC METABOLIC PANEL
ANION GAP: 9 (ref 5–15)
BUN: 18 mg/dL (ref 6–20)
CALCIUM: 9.3 mg/dL (ref 8.9–10.3)
CO2: 29 mmol/L (ref 22–32)
CREATININE: 0.51 mg/dL — AB (ref 0.61–1.24)
Chloride: 101 mmol/L (ref 101–111)
Glucose, Bld: 99 mg/dL (ref 65–99)
POTASSIUM: 3.9 mmol/L (ref 3.5–5.1)
SODIUM: 139 mmol/L (ref 135–145)

## 2015-09-09 LAB — CBC WITH DIFFERENTIAL/PLATELET
BASOS PCT: 0 %
Basophils Absolute: 0 10*3/uL (ref 0.0–0.1)
EOS ABS: 0.4 10*3/uL (ref 0.0–0.7)
Eosinophils Relative: 5 %
HEMATOCRIT: 36.9 % — AB (ref 39.0–52.0)
HEMOGLOBIN: 11.5 g/dL — AB (ref 13.0–17.0)
Lymphocytes Relative: 31 %
Lymphs Abs: 2.5 10*3/uL (ref 0.7–4.0)
MCH: 29.2 pg (ref 26.0–34.0)
MCHC: 31.2 g/dL (ref 30.0–36.0)
MCV: 93.7 fL (ref 78.0–100.0)
MONOS PCT: 9 %
Monocytes Absolute: 0.8 10*3/uL (ref 0.1–1.0)
NEUTROS ABS: 4.5 10*3/uL (ref 1.7–7.7)
NEUTROS PCT: 55 %
Platelets: 253 10*3/uL (ref 150–400)
RBC: 3.94 MIL/uL — AB (ref 4.22–5.81)
RDW: 15.1 % (ref 11.5–15.5)
WBC: 8.2 10*3/uL (ref 4.0–10.5)

## 2015-09-09 MED ORDER — CLONAZEPAM 1 MG PO TABS
1.0000 mg | ORAL_TABLET | Freq: Two times a day (BID) | ORAL | Status: DC
  Administered 2015-09-09 – 2015-09-10 (×4): 1 mg
  Filled 2015-09-09 (×5): qty 1

## 2015-09-09 MED ORDER — QUETIAPINE FUMARATE 50 MG PO TABS
50.0000 mg | ORAL_TABLET | Freq: Three times a day (TID) | ORAL | Status: DC
  Administered 2015-09-09 – 2015-09-10 (×6): 50 mg
  Filled 2015-09-09 (×7): qty 1

## 2015-09-09 NOTE — Progress Notes (Signed)
Patient ID: William NordmannDonald Wayne Pawling, male   DOB: 07/08/1965, 50 y.o.   MRN: [id number] Neuro unchanged

## 2015-09-09 NOTE — Progress Notes (Signed)
Patient ID: [name], male   DOB: 1965-06-11, 50 y.o.   MRN: [id number] Trauma Service Note  Subjective: Patient not doing much more than yesterday.  Objective: Vital signs in last 24 hours: Temp:  [97.9 F (36.6 C)-98.6 F (37 C)] 98.5 F (36.9 C) (05/20 0957) Pulse Rate:  [89-98] 98 (05/20 0957) Resp:  [16-20] 18 (05/20 0957) BP: (112-138)/(66-82) 138/82 mmHg (05/20 0526) SpO2:  [94 %-99 %] 99 % (05/20 1001) FiO2 (%):  [28 %] 28 % (05/20 1001) Weight:  [89.631 kg (197 lb 9.6 oz)] 89.631 kg (197 lb 9.6 oz) (05/20 0400) Last BM Date: 09/09/15  Intake/Output from previous day: 05/19 0701 - 05/20 0700 In: -  Out: 1600 [Urine:1600] Intake/Output this shift: Total I/O In: -  Out: 800 [Urine:800]  General: No distress.  Lungs: Clear  Abd: Benign, diarrhea, leaking from Flexiseal  Extremities: No changes  Neuro: Same deficits  Skin:  Stage III decubitus ulcer, may need debridement, will get wound care consultation.      Lab Results: CBC   Recent Labs  09/09/15 0455  WBC 8.2  HGB 11.5*  HCT 36.9*  PLT 253   BMET  Recent Labs  09/09/15 0455  NA 139  K 3.9  CL 101  CO2 29  GLUCOSE 99  BUN 18  CREATININE 0.51*  CALCIUM 9.3   PT/INR No results for input(s): LABPROT, INR in the last 72 hours. ABG No results for input(s): PHART, HCO3 in the last 72 hours.  Invalid input(s): PCO2, PO2  Studies/Results: No results found.  Anti-infectives: Anti-infectives    Start     Dose/Rate Route Frequency Ordered Stop   09/07/15 1000  sulfamethoxazole-trimethoprim (BACTRIM,SEPTRA) 200-40 MG/5ML suspension 20 mL     20 mL Per Tube Every 12 hours 09/07/15 0922     08/16/15 1000  levofloxacin (LEVAQUIN) IVPB 750 mg     750 mg 100 mL/hr over 90 Minutes Intravenous Every 24 hours 08/16/15 0953 08/25/15 1211   08/15/15 1200  ceFAZolin (ANCEF) IVPB 2g/100 mL premix  Status:  Discontinued     2 g 200 mL/hr over 30 Minutes Intravenous  Once 08/15/15 0858  08/16/15 0944   08/14/15 1200  ceFAZolin (ANCEF) IVPB 2g/100 mL premix  Status:  Discontinued     2 g 200 mL/hr over 30 Minutes Intravenous On call to O.R. 08/13/15 4742 08/15/15 0858   08/04/15 1800  vancomycin (VANCOCIN) IVPB 1000 mg/200 mL premix  Status:  Discontinued     1,000 mg 200 mL/hr over 60 Minutes Intravenous Every 8 hours 08/04/15 0906 08/06/15 0817   08/04/15 1000  ceFEPIme (MAXIPIME) 2 g in dextrose 5 % 50 mL IVPB     2 g 100 mL/hr over 30 Minutes Intravenous Every 8 hours 08/04/15 0906 08/13/15 0231   08/04/15 1000  vancomycin (VANCOCIN) 2,000 mg in sodium chloride 0.9 % 500 mL IVPB     2,000 mg 250 mL/hr over 120 Minutes Intravenous  Once 08/04/15 0906 08/04/15 1446   07/31/15 1930  ceFAZolin (ANCEF) IVPB 2g/100 mL premix  Status:  Discontinued     2 g 200 mL/hr over 30 Minutes Intravenous 3 times per day 07/31/15 1923 08/04/15 0858   07/31/15 1715  ceFAZolin (ANCEF) 2-4 GM/100ML-% IVPB  Status:  Discontinued    Comments:  Key, Jennifer   : cabinet override      07/31/15 1715 07/31/15 1923   07/31/15 1436  vancomycin (VANCOCIN) powder  Status:  Discontinued  As needed 07/31/15 1436 07/31/15 1844   07/31/15 1300  bacitracin 50,000 Units in sodium chloride irrigation 0.9 % 500 mL irrigation  Status:  Discontinued       As needed 07/31/15 1353 07/31/15 1844   07/31/15 1240  vancomycin (VANCOCIN) 1000 MG powder  Status:  Discontinued    Comments:  Ratcliff, Esther   : cabinet override      07/31/15 1240 07/31/15 1248   07/31/15 1231  vancomycin (VANCOCIN) 1000 MG powder    Comments:  Dorinda HillKey, Jennifer   : cabinet override      07/31/15 1231 08/01/15 0044      Assessment/Plan: s/p Procedure(s): PERCUTANEOUS ENDOSCOPIC GASTROSTOMY (PEG) PLACEMENT Now has Stage III decubitus ulcer with some Santyl on top.  Wound care consultation  LOS: 41 days

## 2015-09-10 NOTE — Progress Notes (Signed)
Patient ID: [name], male   DOB: January 17, 1966, 50 y.o.   MRN: [id number] Patient ID: [name], male   DOB: 06-07-1965, 50 y.o.   MRN: [id number] Trauma Service Note  Subjective: Patient not doing much more than yesterday.  Objective: Vital signs in last 24 hours: Temp:  [97.1 F (36.2 C)-98.9 F (37.2 C)] 98 F (36.7 C) (05/21 0943) Pulse Rate:  [82-105] 95 (05/21 0943) Resp:  [17-18] 18 (05/21 0943) BP: (137-163)/(80-95) 155/86 mmHg (05/21 0943) SpO2:  [91 %-99 %] 99 % (05/21 0943) FiO2 (%):  [28 %] 28 % (05/21 0943) Weight:  [87.998 kg (194 lb)] 87.998 kg (194 lb) (05/21 0500) Last BM Date: 09/10/15  Intake/Output from previous day: 05/20 0701 - 05/21 0700 In: 0  Out: 2850 [Urine:2850] Intake/Output this shift:    General: No distress.  Lungs: Clear  Abd: Benign, diarrhea, leaking from Flexiseal  Extremities: No changes  Neuro: Same deficits  Skin:  Stage III decubitus ulcer, may need debridement, will get wound care consultation.      Lab Results: CBC   Recent Labs  09/09/15 0455  WBC 8.2  HGB 11.5*  HCT 36.9*  PLT 253   BMET  Recent Labs  09/09/15 0455  NA 139  K 3.9  CL 101  CO2 29  GLUCOSE 99  BUN 18  CREATININE 0.51*  CALCIUM 9.3   PT/INR No results for input(s): LABPROT, INR in the last 72 hours. ABG No results for input(s): PHART, HCO3 in the last 72 hours.  Invalid input(s): PCO2, PO2  Studies/Results: No results found.  Anti-infectives: Anti-infectives    Start     Dose/Rate Route Frequency Ordered Stop   09/07/15 1000  sulfamethoxazole-trimethoprim (BACTRIM,SEPTRA) 200-40 MG/5ML suspension 20 mL     20 mL Per Tube Every 12 hours 09/07/15 0922     08/16/15 1000  levofloxacin (LEVAQUIN) IVPB 750 mg     750 mg 100 mL/hr over 90 Minutes Intravenous Every 24 hours 08/16/15 0953 08/25/15 1211   08/15/15 1200  ceFAZolin (ANCEF) IVPB 2g/100 mL premix  Status:  Discontinued     2 g 200 mL/hr over 30 Minutes  Intravenous  Once 08/15/15 0858 08/16/15 0944   08/14/15 1200  ceFAZolin (ANCEF) IVPB 2g/100 mL premix  Status:  Discontinued     2 g 200 mL/hr over 30 Minutes Intravenous On call to O.R. 08/13/15 7829 08/15/15 0858   08/04/15 1800  vancomycin (VANCOCIN) IVPB 1000 mg/200 mL premix  Status:  Discontinued     1,000 mg 200 mL/hr over 60 Minutes Intravenous Every 8 hours 08/04/15 0906 08/06/15 0817   08/04/15 1000  ceFEPIme (MAXIPIME) 2 g in dextrose 5 % 50 mL IVPB     2 g 100 mL/hr over 30 Minutes Intravenous Every 8 hours 08/04/15 0906 08/13/15 0231   08/04/15 1000  vancomycin (VANCOCIN) 2,000 mg in sodium chloride 0.9 % 500 mL IVPB     2,000 mg 250 mL/hr over 120 Minutes Intravenous  Once 08/04/15 0906 08/04/15 1446   07/31/15 1930  ceFAZolin (ANCEF) IVPB 2g/100 mL premix  Status:  Discontinued     2 g 200 mL/hr over 30 Minutes Intravenous 3 times per day 07/31/15 1923 08/04/15 0858   07/31/15 1715  ceFAZolin (ANCEF) 2-4 GM/100ML-% IVPB  Status:  Discontinued    Comments:  Key, Jennifer   : cabinet override      07/31/15 1715 07/31/15 1923   07/31/15 1436  vancomycin (VANCOCIN) powder  Status:  Discontinued       As needed 07/31/15 1436 07/31/15 1844   07/31/15 1300  bacitracin 50,000 Units in sodium chloride irrigation 0.9 % 500 mL irrigation  Status:  Discontinued       As needed 07/31/15 1353 07/31/15 1844   07/31/15 1240  vancomycin (VANCOCIN) 1000 MG powder  Status:  Discontinued    Comments:  Ratcliff, Esther   : cabinet override      07/31/15 1240 07/31/15 1248   07/31/15 1231  vancomycin (VANCOCIN) 1000 MG powder    Comments:  Dorinda HillKey, Jennifer   : cabinet override      07/31/15 1231 08/01/15 0044      Assessment/Plan: s/p Procedure(s): PERCUTANEOUS ENDOSCOPIC GASTROSTOMY (PEG) PLACEMENT Now has Stage III decubitus ulcer with some Santyl on top.  Wound care consultation  LOS: 42 days

## 2015-09-10 NOTE — Progress Notes (Signed)
Patient ID: [name], male   DOB: [birth year], 50 y.o.   MRN: [id number] Neuro condition unchanged

## 2015-09-11 LAB — URINE CULTURE: Special Requests: NORMAL

## 2015-09-11 MED ORDER — NITROFURANTOIN MONOHYD MACRO 100 MG PO CAPS
100.0000 mg | ORAL_CAPSULE | Freq: Two times a day (BID) | ORAL | Status: DC
  Filled 2015-09-11 (×2): qty 1

## 2015-09-11 MED ORDER — QUETIAPINE FUMARATE 50 MG PO TABS
50.0000 mg | ORAL_TABLET | Freq: Two times a day (BID) | ORAL | Status: DC
  Administered 2015-09-11 – 2015-09-12 (×3): 50 mg
  Filled 2015-09-11 (×2): qty 1

## 2015-09-11 MED ORDER — NITROFURANTOIN 25 MG/5ML PO SUSP
100.0000 mg | Freq: Two times a day (BID) | ORAL | Status: AC
Start: 2015-09-11 — End: 2015-09-17
  Administered 2015-09-11 – 2015-09-17 (×14): 100 mg
  Filled 2015-09-11 (×15): qty 20

## 2015-09-11 MED ORDER — NITROFURANTOIN 25 MG/5ML PO SUSP
50.0000 mg | Freq: Four times a day (QID) | ORAL | Status: DC
  Filled 2015-09-11: qty 10

## 2015-09-11 MED ORDER — CLONAZEPAM 0.5 MG PO TABS
0.5000 mg | ORAL_TABLET | Freq: Three times a day (TID) | ORAL | Status: DC
  Administered 2015-09-11 – 2015-09-12 (×4): 0.5 mg
  Filled 2015-09-11 (×3): qty 1
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
## 2015-09-11 NOTE — Progress Notes (Signed)
Attempted to downsize trach and upon removal met significant resistance attempting to remove old trach.  Did not change trach and resecured current one.  Left all equipment for change at bedside for MD to change and RN notified of issue.
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
## 2015-09-11 NOTE — Progress Notes (Signed)
MD notified that RRT unable to change trach size down
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
## 2015-09-11 NOTE — Progress Notes (Signed)
RN pulled Hycet from pyxis. RN documented on pyxis that 10mL would be given, RN administered 15mL. No hycet left to waste.

## 2015-09-12 MED ORDER — CLONAZEPAM 0.5 MG PO TABS
0.5000 mg | ORAL_TABLET | Freq: Two times a day (BID) | ORAL | Status: DC
  Administered 2015-09-12 – 2015-09-14 (×3): 0.5 mg
  Filled 2015-09-12 (×3): qty 1

## 2015-09-12 MED ORDER — QUETIAPINE FUMARATE 50 MG PO TABS
50.0000 mg | ORAL_TABLET | Freq: Every day | ORAL | Status: DC
Start: 2015-09-13 — End: 2015-09-14
  Administered 2015-09-14: 50 mg
  Filled 2015-09-12: qty 1
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
## 2015-09-12 NOTE — Progress Notes (Signed)
Patient has not had stool output since 5/22. MD made aware. Flexiseal removed.

## 2015-09-12 NOTE — Progress Notes (Signed)
Patient placed in chair via lift equipment. Patient tolerated well. Returned to bed, bilateral heel protectors in place, heels elevated, HOB elevated for tube feedings. Repositioned throughout shift from right to left q2 hours.
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
## 2015-09-12 NOTE — Progress Notes (Signed)
Redness noted on skin on bilateral wrist restraints. Patient has green mittens on as well. Restraints removed, no broken skin. Restraints reapplied, skin protectant wipe applied to bilateral wrists. Restraints applied correctly.

## 2015-09-12 NOTE — Progress Notes (Signed)
Rehab admissions - Patient making very slow progress.  Likely will need SNF level therapies.  Wife unlikely to be able to manage after a potential inpatient rehab stay.  Patient has a Insurance underwriterBCBS plan and they are unlikely to authorize inpatient rehab and SNF therapies.  Call me for questions.  #[phone]

## 2015-09-12 NOTE — Clinical Social Work Placement (Signed)
   CLINICAL SOCIAL WORK PLACEMENT  NOTE  Date:  09/12/2015  Patient Details  Name: Julien NordmannDonald Wayne Schranz MRN: [id number] Date of Birth: March 03, 1966  Clinical Social Work is seeking post-discharge placement for this patient at the Skilled  Nursing Facility level of care (*CSW will initial, date and re-position this form in  chart as items are completed):  Yes   Patient/family provided with [place] Clinical Social Work Department's list of facilities offering this level of care within the geographic area requested by the patient (or if unable, by the patient's family).  Yes   Patient/family informed of their freedom to choose among providers that offer the needed level of care, that participate in Medicare, Medicaid or managed care program needed by the patient, have an available bed and are willing to accept the patient.  Yes   Patient/family informed of [facility]'s ownership interest in [facility] and [facility], as well as of the fact that they are under no obligation to receive care at these facilities.  PASRR submitted to EDS on 09/12/15     PASRR number received on 09/12/15     Existing PASRR number confirmed on       FL2 transmitted to all facilities in geographic area requested by pt/family on 09/12/15     FL2 transmitted to all facilities within larger geographic area on 09/12/15     Patient informed that his/her managed care company has contracts with or will negotiate with certain facilities, including the following:            Patient/family informed of bed offers received.  Patient chooses bed at       Physician recommends and patient chooses bed at      Patient to be transferred to   on  .  Patient to be transferred to facility by       Patient family notified on   of transfer.  Name of family member notified:        PHYSICIAN Please sign FL2     Additional Comment:    _______________________________________________ Derenda FennelBashira Casilda Pickerill, MSW,  LCSWA [phone] 09/12/2015 3:21 PM

## 2015-09-13 NOTE — Progress Notes (Addendum)
Pt up to chair, sat in chair 1 hour.  rn asked pt what his name was, ggave different options of names, pt nodded correctly to "[name]", which per wife is what he goes by. Pt also given options of who visitors was, sisters, wife, etc. Pt nodded correctly that visitor was his wife.   Is able to follow some commands, does better when rn performs command first, such as telling pt to clap hands. Pt smiles upon command.
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
## 2015-09-14 ENCOUNTER — Inpatient Hospital Stay (HOSPITAL_COMMUNITY): Payer: BLUE CROSS/BLUE SHIELD

## 2015-09-14 DIAGNOSIS — R251 Tremor, unspecified: Secondary | ICD-10-CM

## 2015-09-14 DIAGNOSIS — S82142A Displaced bicondylar fracture of left tibia, initial encounter for closed fracture: Secondary | ICD-10-CM | POA: Diagnosis present

## 2015-09-14 DIAGNOSIS — R569 Unspecified convulsions: Secondary | ICD-10-CM

## 2015-09-14 MED ORDER — FREE WATER
100.0000 mL | Status: DC
  Administered 2015-09-14 – 2015-09-20 (×36): 100 mL
  Filled 2015-09-14: qty 100

## 2015-09-14 MED ORDER — CLONAZEPAM 0.5 MG PO TABS
0.5000 mg | ORAL_TABLET | Freq: Every day | ORAL | Status: DC
  Administered 2015-09-14: 0.5 mg
  Filled 2015-09-14: qty 1

## 2015-09-14 NOTE — Clinical Social Work Note (Signed)
Clinical Social Worker continuing to follow patient and family for support and discharge planning needs.  Patient has been decannulated and mittens off.  [facility] and insurance authorization obtained.  CSW left message with patient wife to provide information about facility acceptance and potential for discharge tomorrow pending patient medical stability.  CSW remains available for support and to facilitate patient discharge needs once medically ready.  Macario GoldsJesse Elyshia Kumagai, [place] [phone]

## 2015-09-14 NOTE — Progress Notes (Signed)
MD monitored pt and spoke with family regarding pt's status and progress. While observing pt, MD noticed pt's jaw shivering and fluttering of eyelids. MD mentioned that he was concerned about pt possibly having focal seizures and stated will contact Neuro for consult.

## 2015-09-14 NOTE — Procedures (Signed)
Tracheostomy Change Note  Patient Details:   Name: [name] DOB: 1966-04-03 MRN: [id number]    Airway Documentation:     Evaluation  O2 sats: stable throughout Complications: No apparent complications Patient did tolerate procedure well. Bilateral Breath Sounds: Clear, Diminished    Pt decannulated and dressing applied to stoma. PT tolerated well. Site looks clean and in tact with no redness.   Ok AnisKelly Smith, MA 09/14/2015, 9:46 AM
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
## 2015-09-15 ENCOUNTER — Inpatient Hospital Stay (HOSPITAL_COMMUNITY)
Admit: 2015-09-15 | Discharge: 2015-09-15 | Disposition: A | Discharge status: Home / Self Care | Payer: BLUE CROSS/BLUE SHIELD | Attending: Neurology | Admitting: Neurology

## 2015-09-15 ENCOUNTER — Other Ambulatory Visit: Payer: Self-pay

## 2015-09-15 ENCOUNTER — Other Ambulatory Visit (HOSPITAL_COMMUNITY): Payer: BLUE CROSS/BLUE SHIELD

## 2015-09-15 ENCOUNTER — Inpatient Hospital Stay (HOSPITAL_COMMUNITY): Payer: BLUE CROSS/BLUE SHIELD

## 2015-09-15 DIAGNOSIS — S069X6A Unspecified intracranial injury with loss of consciousness greater than 24 hours without return to pre-existing conscious level with patient surviving, initial encounter: Secondary | ICD-10-CM

## 2015-09-15 LAB — GLUCOSE, CAPILLARY
GLUCOSE-CAPILLARY: 147 mg/dL — AB (ref 65–99)
GLUCOSE-CAPILLARY: 116 mg/dL — AB (ref 65–99)
Glucose-Capillary: 127 mg/dL — ABNORMAL HIGH (ref 65–99)

## 2015-09-15 LAB — CBC
HEMATOCRIT: 38.9 % — AB (ref 39.0–52.0)
HEMOGLOBIN: 12 g/dL — AB (ref 13.0–17.0)
MCH: 29.1 pg (ref 26.0–34.0)
MCHC: 30.8 g/dL (ref 30.0–36.0)
MCV: 94.4 fL (ref 78.0–100.0)
Platelets: 248 10*3/uL (ref 150–400)
RBC: 4.12 MIL/uL — ABNORMAL LOW (ref 4.22–5.81)
RDW: 14.7 % (ref 11.5–15.5)
WBC: 10.4 10*3/uL (ref 4.0–10.5)

## 2015-09-15 LAB — BASIC METABOLIC PANEL
ANION GAP: 9 (ref 5–15)
BUN: 22 mg/dL — ABNORMAL HIGH (ref 6–20)
CALCIUM: 9.1 mg/dL (ref 8.9–10.3)
CHLORIDE: 105 mmol/L (ref 101–111)
CO2: 28 mmol/L (ref 22–32)
Creatinine, Ser: 0.49 mg/dL — ABNORMAL LOW (ref 0.61–1.24)
GFR calc non Af Amer: >60 mL/min (ref >60)
GLUCOSE: 130 mg/dL — AB (ref 65–99)
Potassium: 3.6 mmol/L (ref 3.5–5.1)
Sodium: 142 mmol/L (ref 135–145)

## 2015-09-15 MED ORDER — LORAZEPAM 2 MG/ML IJ SOLN
1.0000 mg | Freq: Once | INTRAMUSCULAR | Status: AC
  Administered 2015-09-15: 1 mg via INTRAVENOUS
  Filled 2015-09-15: qty 1

## 2015-09-15 MED ORDER — METOPROLOL TARTRATE 5 MG/5ML IV SOLN
INTRAVENOUS | Status: AC
  Administered 2015-09-15: 2.5 mg via INTRAVENOUS
  Filled 2015-09-15: qty 5

## 2015-09-15 MED ORDER — DIGOXIN 0.25 MG/ML IJ SOLN
0.2500 mg | Freq: Every day | INTRAMUSCULAR | Status: AC
  Administered 2015-09-15 – 2015-09-16 (×2): 0.25 mg via INTRAVENOUS
  Filled 2015-09-15 (×2): qty 2
  Filled 2015-09-15: qty 1

## 2015-09-15 MED ORDER — DILTIAZEM HCL 100 MG IV SOLR
5.0000 mg/h | INTRAVENOUS | Status: DC
  Filled 2015-09-15: qty 100

## 2015-09-15 MED ORDER — SODIUM CHLORIDE 0.9 % IV BOLUS (SEPSIS)
250.0000 mL | Freq: Once | INTRAVENOUS | Status: AC
  Administered 2015-09-15: 250 mL via INTRAVENOUS

## 2015-09-15 MED ORDER — DILTIAZEM HCL 30 MG PO TABS
30.0000 mg | ORAL_TABLET | Freq: Four times a day (QID) | ORAL | Status: DC
  Administered 2015-09-15 – 2015-09-16 (×3): 30 mg via ORAL
  Filled 2015-09-15 (×3): qty 1

## 2015-09-15 MED ORDER — METOPROLOL TARTRATE 5 MG/5ML IV SOLN
5.0000 mg | Freq: Once | INTRAVENOUS | Status: AC
  Administered 2015-09-15: 2.5 mg via INTRAVENOUS

## 2015-09-15 NOTE — Progress Notes (Signed)
EEG completed full report to follow. °
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
## 2015-09-15 NOTE — Consult Note (Signed)
WOC wound follow up Wound type: Unstageable Pressure Injury Measurement:2.5cm x 6cm x 0.1cm  Wound bed: wound bed is cleaning up with enzymatic debridement ointment.  75% soft grey, black eschar, 25% pink, moist tissue at wound perimeter.  Drainage (amount, consistency, odor) moderate, serosanguinous  Periwound: intact  Flexiseal out, will need to monitor dressings for contamination Dressing procedure/placement/frequency: Continue enzymatic debridement and LALM for now. WOC will reassess weekly.  Chair pressure redistribution pad in room for use when patient up in chair. If patient to CIR will need appropriate WC cushion as well.  Maximize nutrition for wound healing.    WOC team will follow along with you for weekly wound assessments.  Please notify me of any acute changes in the wounds or any new areas of concerns Armen PickupMelody Akshay Spang RN,CWOCN [phone]
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
## 2015-09-15 NOTE — Progress Notes (Signed)
Pt transferred from 5MW. Upon arrival to room pt noted to be in ST HR in the low 100s (104-106). MD Algie CofferKadakia notified & stated to hold the pt's cardizem drip & to give the pt his scheduled dig. CCMD notified of pt transferring to 2H15. Will continue to monitor the pt. Sanda LingerMilam, Naquan Garman R, RN

## 2015-09-15 NOTE — Progress Notes (Signed)
  Echocardiogram 2D Echocardiogram has been performed.  [name], [name] 09/15/2015, 5:56 PM
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
## 2015-09-15 NOTE — Progress Notes (Signed)
PT Cancellation Note  Patient Details Name: Julien NordmannDonald Wayne Nakagawa MRN: [id number] DOB: Feb 18, 1966   Cancelled Treatment:    Reason Eval/Treat Not Completed: Other (comment) (patient into Afib and transfer to step down unit)   Fabio AsaWerner, Caden Fukushima J 09/15/2015, 2:53 PM Charlotte Crumbevon Jamol Ginyard, PT DPT  [phone]

## 2015-09-15 NOTE — Progress Notes (Signed)
OT Cancellation Note  Patient Details Name: [name] MRN: [id number] DOB: 07-15-1965   Cancelled Treatment:    Reason Eval/Treat Not Completed: Patient at procedure or test/ unavailable  Felecia ShellingJones, Jourdyn Hasler B   Chance Munter, Brynn   OTR/L Pager: [phone] Office: [phone] .  09/15/2015, 9:56 AM

## 2015-09-15 NOTE — Clinical Social Work Note (Signed)
Patient not medically ready for discharge yet per md, CSW continuing to follow patient's progress.  William KnackEric R. Kaan Logan, MSW, William MajorsLCSWA [phone] 09/15/2015 4:14 PM

## 2015-09-15 NOTE — Progress Notes (Signed)
[place] in [place], Casimiro NeedleMichael GeorgiaPA at bedside.  Cardizem gtt ordered for rate control.  Dr Algie CofferKadakia consulted and at bedside. BP 110/60  AF 150s 2.5mg  Lopressor Given IV and 250cc NS bolus given per orders.  HR improved to ST 100-105.  Orders received to hold remaining 2.5 Lopressor and Cardizem gtt.  Patient transported to 2H15, RN updated on patient status.

## 2015-09-16 ENCOUNTER — Inpatient Hospital Stay (HOSPITAL_COMMUNITY): Payer: BLUE CROSS/BLUE SHIELD

## 2015-09-16 LAB — CBC WITH DIFFERENTIAL/PLATELET
BASOS PCT: 0 %
Basophils Absolute: 0 10*3/uL (ref 0.0–0.1)
EOS ABS: 0.2 10*3/uL (ref 0.0–0.7)
Eosinophils Relative: 2 %
HCT: 38.5 % — ABNORMAL LOW (ref 39.0–52.0)
HEMOGLOBIN: 11.9 g/dL — AB (ref 13.0–17.0)
LYMPHS PCT: 29 %
Lymphs Abs: 2.9 10*3/uL (ref 0.7–4.0)
MCH: 29.2 pg (ref 26.0–34.0)
MCHC: 30.9 g/dL (ref 30.0–36.0)
MCV: 94.4 fL (ref 78.0–100.0)
MONO ABS: 0.6 10*3/uL (ref 0.1–1.0)
MONOS PCT: 6 %
NEUTROS ABS: 6.1 10*3/uL (ref 1.7–7.7)
Neutrophils Relative %: 63 %
Platelets: 242 10*3/uL (ref 150–400)
RBC: 4.08 MIL/uL — ABNORMAL LOW (ref 4.22–5.81)
RDW: 14.6 % (ref 11.5–15.5)
WBC: 9.8 10*3/uL (ref 4.0–10.5)

## 2015-09-16 LAB — ECHOCARDIOGRAM LIMITED
HEIGHTINCHES: 71 in
WEIGHTICAEL: 3001.6 [oz_av]

## 2015-09-16 LAB — BASIC METABOLIC PANEL
Anion gap: 8 (ref 5–15)
BUN: 23 mg/dL — ABNORMAL HIGH (ref 6–20)
CALCIUM: 9 mg/dL (ref 8.9–10.3)
CO2: 28 mmol/L (ref 22–32)
CREATININE: 0.52 mg/dL — AB (ref 0.61–1.24)
Chloride: 103 mmol/L (ref 101–111)
GFR calc non Af Amer: >60 mL/min (ref >60)
Glucose, Bld: 116 mg/dL — ABNORMAL HIGH (ref 65–99)
Potassium: 3.6 mmol/L (ref 3.5–5.1)
SODIUM: 139 mmol/L (ref 135–145)

## 2015-09-16 LAB — URINALYSIS, ROUTINE W REFLEX MICROSCOPIC
Bilirubin Urine: NEGATIVE
GLUCOSE, UA: NEGATIVE mg/dL
KETONES UR: NEGATIVE mg/dL
Nitrite: NEGATIVE
PH: 6 (ref 5.0–8.0)
PROTEIN: 100 mg/dL — AB
Specific Gravity, Urine: 1.025 (ref 1.005–1.030)

## 2015-09-16 LAB — URINE MICROSCOPIC-ADD ON

## 2015-09-16 LAB — GLUCOSE, CAPILLARY
GLUCOSE-CAPILLARY: 131 mg/dL — AB (ref 65–99)
GLUCOSE-CAPILLARY: 118 mg/dL — AB (ref 65–99)
GLUCOSE-CAPILLARY: 142 mg/dL — AB (ref 65–99)
GLUCOSE-CAPILLARY: 151 mg/dL — AB (ref 65–99)
Glucose-Capillary: 126 mg/dL — ABNORMAL HIGH (ref 65–99)
Glucose-Capillary: 135 mg/dL — ABNORMAL HIGH (ref 65–99)

## 2015-09-16 LAB — MRSA PCR SCREENING: MRSA BY PCR: POSITIVE — AB

## 2015-09-16 MED ORDER — CHLORHEXIDINE GLUCONATE CLOTH 2 % EX PADS
6.0000 | MEDICATED_PAD | Freq: Every day | CUTANEOUS | Status: DC
  Administered 2015-09-16 – 2015-09-17 (×2): 6 via TOPICAL

## 2015-09-16 MED ORDER — DILTIAZEM HCL 60 MG PO TABS: 60.0000 mg | ORAL_TABLET | Freq: Four times a day (QID) | ORAL | Status: DC

## 2015-09-16 MED ORDER — DILTIAZEM 12 MG/ML ORAL SUSPENSION
60.0000 mg | Freq: Four times a day (QID) | ORAL | Status: DC
  Administered 2015-09-16 – 2015-09-20 (×18): 60 mg
  Filled 2015-09-16 (×20): qty 6

## 2015-09-16 MED ORDER — MUPIROCIN 2 % EX OINT
1.0000 "application " | TOPICAL_OINTMENT | Freq: Two times a day (BID) | CUTANEOUS | Status: AC
  Administered 2015-09-16 – 2015-09-20 (×10): 1 via NASAL
  Filled 2015-09-16: qty 22

## 2015-09-16 NOTE — Progress Notes (Signed)
Accepted patient to 785C08. Wife at his side.

## 2015-09-16 NOTE — Progress Notes (Signed)
Multiple alerts received for patient from CCM. Patient is alert, telemetry leads are being changed. NT collecting vital signs. RN has notified pharmacy for cardizem oral suspension and will administer upon medication arrival to unit. NT with patient at this time, RN will continue to monitor.

## 2015-09-16 NOTE — Progress Notes (Signed)
SLP Cancellation Note  Patient Details Name: Julien NordmannDonald Wayne Casanova MRN: [id number] DOB: Jun 09, 1965   Cancelled treatment:       Reason Eval/Treat Not Completed: Other ([facility] competencies not completed)   ADAMS,PAT, M.S., CCC-SLP 09/16/2015, 10:57 AM

## 2015-09-16 NOTE — Consult Note (Signed)
Ref: No primary care provider on file./Trauma MD   Subjective:  Awake, febrile. Monitor-Sinus rhythm, heart rate 100-110/min.  Objective:  Vital Signs in the last 24 hours: Temp:  [97.6 F (36.4 C)-101.3 F (38.5 C)] 100.2 F (37.9 C) (05/27 1143) Pulse Rate:  [99-127] 106 (05/27 0800) Cardiac Rhythm:  [-] Sinus tachycardia (05/27 1507) Resp:  [16-30] 19 (05/27 1400) BP: (111-165)/(71-125) 144/71 mmHg (05/27 1400) SpO2:  [96 %-100 %] 97 % (05/27 1400) Weight:  [72.031 kg (158 lb 12.8 oz)] 72.031 kg (158 lb 12.8 oz) (05/27 0400)  Physical Exam: BP Readings from Last 1 Encounters:  09/16/15 144/71    Wt Readings from Last 1 Encounters:  09/16/15 72.031 kg (158 lb 12.8 oz)    Weight change:   HEENT: [place]/AT, Eyes- Conjunctiva-Pink, Sclera-Non-icteric Neck: No JVD, No bruit, Trachea midline. Lungs:  Clear anteriorly, Bilateral. Cardiac:  Regular rhythm, normal S1 and S2, no S3.  Abdomen:  Soft, non-tender. Extremities:  No edema present. No cyanosis. No clubbing. CNS: AxOx0.  Skin: Warm and dry.   Intake/Output from previous day: 05/26 0701 - 05/27 0700 In: 40981 [NG/GT:19400] Out: 1725 [Urine:1725]    Lab Results: BMET    Component Value Date/Time   NA 139 09/16/2015 0830   NA 142 09/15/2015 0435   NA 139 09/09/2015 0455   K 3.6 09/16/2015 0830   K 3.6 09/15/2015 0435   K 3.9 09/09/2015 0455   CL 103 09/16/2015 0830   CL 105 09/15/2015 0435   CL 101 09/09/2015 0455   CO2 28 09/16/2015 0830   CO2 28 09/15/2015 0435   CO2 29 09/09/2015 0455   GLUCOSE 116* 09/16/2015 0830   GLUCOSE 130* 09/15/2015 0435   GLUCOSE 99 09/09/2015 0455   BUN 23* 09/16/2015 0830   BUN 22* 09/15/2015 0435   BUN 18 09/09/2015 0455   CREATININE 0.52* 09/16/2015 0830   CREATININE 0.49* 09/15/2015 0435   CREATININE 0.51* 09/09/2015 0455   CALCIUM 9.0 09/16/2015 0830   CALCIUM 9.1 09/15/2015 0435   CALCIUM 9.3 09/09/2015 0455   GFRNONAA >60 09/16/2015 0830   GFRNONAA >60 09/15/2015  0435   GFRNONAA >60 09/09/2015 0455   GFRAA >60 09/16/2015 0830   GFRAA >60 09/15/2015 0435   GFRAA >60 09/09/2015 0455   CBC    Component Value Date/Time   WBC 9.8 09/16/2015 0830   RBC 4.08* 09/16/2015 0830   HGB 11.9* 09/16/2015 0830   HCT 38.5* 09/16/2015 0830   PLT 242 09/16/2015 0830   MCV 94.4 09/16/2015 0830   MCH 29.2 09/16/2015 0830   MCHC 30.9 09/16/2015 0830   RDW 14.6 09/16/2015 0830   LYMPHSABS 2.9 09/16/2015 0830   MONOABS 0.6 09/16/2015 0830   EOSABS 0.2 09/16/2015 0830   BASOSABS 0.0 09/16/2015 0830   HEPATIC Function Panel  Recent Labs  07/30/15 0735 08/07/15 0415 08/22/15 0346  PROT 4.9* 4.7* 5.8*   HEMOGLOBIN A1C No components found for: HGA1C,  MPG CARDIAC ENZYMES No results found for: CKTOTAL, CKMB, CKMBINDEX, TROPONINI BNP No results for input(s): PROBNP in the last 8760 hours. TSH No results for input(s): TSH in the last 8760 hours. CHOLESTEROL No results for input(s): CHOL in the last 8760 hours.  Scheduled Meds: . antiseptic oral rinse  7 mL Mouth Rinse 10 times per day  . Chlorhexidine Gluconate Cloth  6 each Topical Q0600  . collagenase   Topical Daily  . diltiazem  60 mg Per Tube Q6H  . enoxaparin (LOVENOX) injection  30  mg Subcutaneous Q12H  . folic acid  1 mg Per Tube Daily  . free water  100 mL Per Tube Q4H  . metoprolol tartrate  50 mg Per Tube BID  . mupirocin ointment  1 application Nasal BID  . nitrofurantoin  100 mg Per Tube Q12H  . pantoprazole sodium  40 mg Per Tube Daily  . sodium chloride flush  10-40 mL Intracatheter Q12H  . thiamine  100 mg Per Tube Daily   Continuous Infusions: . feeding supplement (PIVOT 1.5 CAL) 1,000 mL (09/16/15 0536)   PRN Meds:.acetaminophen (TYLENOL) oral liquid 160 mg/5 mL, bisacodyl, haloperidol lactate, hydrALAZINE, HYDROcodone-acetaminophen, ipratropium-albuterol, loperamide, morphine injection, ondansetron **OR** ondansetron (ZOFRAN) IV, sodium chloride  flush  Assessment/Plan: Atrial fibrillation with rapid ventricular response-resolved Severe head trauma with encephalopathy Sinus tachycardia Fever with possible left lung pneumonia  Limited echocardiogram showing fair LV systolic function. Increase diltiazem dose. Antibiotics per primary consultant.    LOS: 48 days    Orpah CobbAjay Luisfernando Brightwell  MD  09/16/2015, 3:34 PM
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
## 2015-09-17 LAB — URINE CULTURE: CULTURE: NO GROWTH

## 2015-09-17 LAB — GLUCOSE, CAPILLARY
GLUCOSE-CAPILLARY: 126 mg/dL — AB (ref 65–99)
GLUCOSE-CAPILLARY: 134 mg/dL — AB (ref 65–99)
Glucose-Capillary: 125 mg/dL — ABNORMAL HIGH (ref 65–99)
Glucose-Capillary: 127 mg/dL — ABNORMAL HIGH (ref 65–99)
Glucose-Capillary: 138 mg/dL — ABNORMAL HIGH (ref 65–99)

## 2015-09-17 NOTE — Progress Notes (Signed)
   09/17/15 0409  Vitals  Temp 98.8 F (37.1 C)  Temp Source Axillary  BP (!) 160/93 mmHg  BP Location Left Arm  BP Method Automatic  Patient Position (if appropriate) Lying  Pulse Rate (!) 114  Pulse Rate Source Monitor   RN received 5 notification from CCM patient vtach  beginning at 0437. Patient HR is fluctuating 115-134 on bedside monitor. Patient very fidgety throughout night. Temp decreased, patient warm to the touch but no longer diaphoretic. RN will continue to monitor and notify on-call MD.

## 2015-09-17 NOTE — Progress Notes (Signed)
Temp was 101.1, administered PRN med as ordered, temp down to 98.6. Will continue to monitor.

## 2015-09-17 NOTE — Progress Notes (Signed)
Patient currently receiving hydrotherapy

## 2015-09-17 NOTE — Progress Notes (Signed)
Ref: No primary care provider on file.   Subjective:  Awakens easy but not communicating. Heart rate in 90's and sinus rhythm  Objective:  Vital Signs in the last 24 hours: Temp:  [98.8 F (37.1 C)-102 F (38.9 C)] 99.7 F (37.6 C) (05/28 0901) Pulse Rate:  [84-120] 91 (05/28 0901) Cardiac Rhythm:  [-] Normal sinus rhythm (05/28 0700) Resp:  [18-30] 20 (05/28 0901) BP: (119-162)/(71-125) 119/80 mmHg (05/28 0901) SpO2:  [92 %-98 %] 92 % (05/28 0901) Weight:  [89.994 kg (198 lb 6.4 oz)] 89.994 kg (198 lb 6.4 oz) (05/28 0518)  Physical Exam: BP Readings from Last 1 Encounters:  09/17/15 119/80    Wt Readings from Last 1 Encounters:  09/17/15 89.994 kg (198 lb 6.4 oz)    Weight change: 17.963 kg (39 lb 9.6 oz)  HEENT: [place]/AT, Eyes-Conjunctiva-Pink, Sclera-Non-icteric Neck: No JVD, No bruit, Trachea midline. Lungs:  Clear, Bilateral. Cardiac:  Regular rhythm, normal S1 and S2, no S3.  Abdomen:  Soft, non-tender. Extremities:  No edema present. No cyanosis. No clubbing. CNS: AxOx0,  Skin: Warm and dry.   Intake/Output from previous day: 05/27 0701 - 05/28 0700 In: 4641 [NG/GT:790] Out: 1025 [Urine:1025]    Lab Results: BMET    Component Value Date/Time   NA 139 09/16/2015 0830   NA 142 09/15/2015 0435   NA 139 09/09/2015 0455   K 3.6 09/16/2015 0830   K 3.6 09/15/2015 0435   K 3.9 09/09/2015 0455   CL 103 09/16/2015 0830   CL 105 09/15/2015 0435   CL 101 09/09/2015 0455   CO2 28 09/16/2015 0830   CO2 28 09/15/2015 0435   CO2 29 09/09/2015 0455   GLUCOSE 116* 09/16/2015 0830   GLUCOSE 130* 09/15/2015 0435   GLUCOSE 99 09/09/2015 0455   BUN 23* 09/16/2015 0830   BUN 22* 09/15/2015 0435   BUN 18 09/09/2015 0455   CREATININE 0.52* 09/16/2015 0830   CREATININE 0.49* 09/15/2015 0435   CREATININE 0.51* 09/09/2015 0455   CALCIUM 9.0 09/16/2015 0830   CALCIUM 9.1 09/15/2015 0435   CALCIUM 9.3 09/09/2015 0455   GFRNONAA >60 09/16/2015 0830   GFRNONAA >60  09/15/2015 0435   GFRNONAA >60 09/09/2015 0455   GFRAA >60 09/16/2015 0830   GFRAA >60 09/15/2015 0435   GFRAA >60 09/09/2015 0455   CBC    Component Value Date/Time   WBC 9.8 09/16/2015 0830   RBC 4.08* 09/16/2015 0830   HGB 11.9* 09/16/2015 0830   HCT 38.5* 09/16/2015 0830   PLT 242 09/16/2015 0830   MCV 94.4 09/16/2015 0830   MCH 29.2 09/16/2015 0830   MCHC 30.9 09/16/2015 0830   RDW 14.6 09/16/2015 0830   LYMPHSABS 2.9 09/16/2015 0830   MONOABS 0.6 09/16/2015 0830   EOSABS 0.2 09/16/2015 0830   BASOSABS 0.0 09/16/2015 0830   HEPATIC Function Panel  Recent Labs  07/30/15 0735 08/07/15 0415 08/22/15 0346  PROT 4.9* 4.7* 5.8*   HEMOGLOBIN A1C No components found for: HGA1C,  MPG CARDIAC ENZYMES No results found for: CKTOTAL, CKMB, CKMBINDEX, TROPONINI BNP No results for input(s): PROBNP in the last 8760 hours. TSH No results for input(s): TSH in the last 8760 hours. CHOLESTEROL No results for input(s): CHOL in the last 8760 hours.  Scheduled Meds: . antiseptic oral rinse  7 mL Mouth Rinse 10 times per day  . Chlorhexidine Gluconate Cloth  6 each Topical Q0600  . collagenase   Topical Daily  . diltiazem  60 mg Per Tube  Q6H  . enoxaparin (LOVENOX) injection  30 mg Subcutaneous Q12H  . folic acid  1 mg Per Tube Daily  . free water  100 mL Per Tube Q4H  . metoprolol tartrate  50 mg Per Tube BID  . mupirocin ointment  1 application Nasal BID  . nitrofurantoin  100 mg Per Tube Q12H  . pantoprazole sodium  40 mg Per Tube Daily  . sodium chloride flush  10-40 mL Intracatheter Q12H  . thiamine  100 mg Per Tube Daily   Continuous Infusions: . feeding supplement (PIVOT 1.5 CAL) 1,000 mL (09/16/15 0536)   PRN Meds:.acetaminophen (TYLENOL) oral liquid 160 mg/5 mL, bisacodyl, haloperidol lactate, hydrALAZINE, HYDROcodone-acetaminophen, ipratropium-albuterol, loperamide, morphine injection, ondansetron **OR** ondansetron (ZOFRAN) IV, sodium chloride  flush  Assessment/Plan: Atrial fibrillation with rapid ventricular response-resolved Severe head trauma with encephalopathy Sinus tachycardia-improved Fever with possible left lung pneumonia UTI-resolving  Continue medical treatment.    LOS: 49 days    Orpah CobbAjay Shahara Hartsfield  MD  09/17/2015, 9:12 AM

## 2015-09-17 NOTE — Progress Notes (Signed)
Physical Therapy Wound Treatment Patient Details  Name: [name] MRN: [id number] Date of Birth: 02/15/1966  Today's Date: 09/17/2015 Time: 0859-0932 Time Calculation (min): 33 min  Subjective  Subjective: nonverbal Patient and Family Stated Goals: Unable to state Date of Onset:  (in hospital) Prior Treatments: dressing changes  Pain Score:    Wound Assessment  Pressure Ulcer 08/31/15 Unstageable - Full thickness tissue loss in which the base of the ulcer is covered by slough (yellow, tan, gray, green or brown) and/or eschar (tan, brown or black) in the wound bed. same pressure ulcer as previously documented. 3 (Active)  Dressing Type ABD;Gauze (Comment);Moist to dry 09/17/2015  9:06 AM  Dressing Changed;Clean;Dry;Intact 09/17/2015  9:06 AM  Dressing Change Frequency Daily 09/17/2015  9:06 AM  State of Healing Eschar 09/17/2015  9:06 AM  Site / Wound Assessment Pink;Yellow 09/17/2015  9:06 AM  % Wound base Red or Granulating 25% 09/17/2015  9:06 AM  % Wound base Yellow 75% 09/17/2015  9:06 AM  % Wound base Black 0% 09/17/2015  9:06 AM  % Wound base Other (Comment) 0% 09/17/2015  9:06 AM  Peri-wound Assessment Intact 09/17/2015  9:06 AM  Wound Length (cm) 3.5 cm 09/17/2015  9:06 AM  Wound Width (cm) 6 cm 09/17/2015  9:06 AM  Wound Depth (cm) 0.1 cm 09/17/2015  9:06 AM  Tunneling (cm) 0 09/07/2015  4:00 AM  Undermining (cm) 0 09/07/2015  4:00 AM  Margins Unattached edges (unapproximated) 09/17/2015  9:06 AM  Drainage Amount Moderate 09/17/2015  9:06 AM  Drainage Description Serosanguineous 09/17/2015  9:06 AM  Treatment Debridement (Selective);Hydrotherapy (Pulse lavage);Packing (Saline gauze) 09/17/2015  9:06 AM   Hydrotherapy Pulsed lavage therapy - wound location: sacrum Pulsed Lavage with Suction (psi): 8 psi (8-12) Pulsed Lavage with Suction - Normal Saline Used: 1000 mL Pulsed Lavage Tip: Tip with splash shield Selective Debridement Selective Debridement - Location:  sacrum Selective Debridement - Tools Used: Forceps;Scalpel Selective Debridement - Tissue Removed: yellow necrotic tissue   Wound Assessment and Plan  Wound Therapy - Assess/Plan/Recommendations Wound Therapy - Clinical Statement: Pt presents to hydrotherapy with sacral wound and can benefit from hydrotherapy to remove necrotic tissue and promote wound healing. Wound Therapy - Functional Problem List: Decr mobility and sitting time Factors Delaying/Impairing Wound Healing: Incontinence;Immobility;Multiple medical problems;Polypharmacy Hydrotherapy Plan: Debridement;Dressing change;Patient/family education;Pulsatile lavage with suction Wound Therapy - Frequency: 6X / week Wound Therapy - Follow Up Recommendations: Other (comment) Wound Plan: See above  Wound Therapy Goals- Improve the function of patient's integumentary system by progressing the wound(s) through the phases of wound healing (inflammation - proliferation - remodeling) by: Decrease Necrotic Tissue to: 50 Decrease Necrotic Tissue - Progress: Goal set today Increase Granulation Tissue to: 50 Increase Granulation Tissue - Progress: Goal set today  Goals will be updated until maximal potential achieved or discharge criteria met.  Discharge criteria: when goals achieved, discharge from hospital, MD decision/surgical intervention, no progress towards goals, refusal/missing three consecutive treatments without notification or medical reason.  GP     , 09/17/2015, 11:49 AM   PT [phone]    

## 2015-09-17 NOTE — Progress Notes (Signed)
Patient diaphoretic,  right arm tremor increase. See flowsheet for reassessment. 650 mg Tylenol oral suspension administered to patient via PEG tube, cool cloth placed on patients forehead, patient repositioned, oral care provided. RN will continue to monitor.
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
## 2015-09-18 LAB — GLUCOSE, CAPILLARY
GLUCOSE-CAPILLARY: 137 mg/dL — AB (ref 65–99)
GLUCOSE-CAPILLARY: 121 mg/dL — AB (ref 65–99)
GLUCOSE-CAPILLARY: 142 mg/dL — AB (ref 65–99)
Glucose-Capillary: 123 mg/dL — ABNORMAL HIGH (ref 65–99)

## 2015-09-18 MED ORDER — METOPROLOL TARTRATE 25 MG/10 ML ORAL SUSPENSION
50.0000 mg | Freq: Three times a day (TID) | ORAL | Status: DC
  Administered 2015-09-18 – 2015-09-20 (×7): 50 mg
  Filled 2015-09-18 (×9): qty 20

## 2015-09-18 NOTE — Progress Notes (Signed)
Patient HR 134, increased rigidity, increase intensity of tremor in RUE. Multiple Vtach notifications from CCM patient HR > 160. Trauma paged.

## 2015-09-18 NOTE — Progress Notes (Signed)
Physical Therapy Wound Treatment Patient Details  Name: [name] MRN: [id number] Date of Birth: 02/18/1966  Today's Date: 09/18/2015 Time: 7425-9563 Time Calculation (min): 39 min  Subjective  Subjective: nonverbal Patient and Family Stated Goals: Unable to state Date of Onset:  (in hospital) Prior Treatments: dressing changes  Pain Score: Pt with increased tremors at times in RUE when PT or tech were not visible, however tremors calmed when we were in line of sight.   Wound Assessment  Pressure Ulcer 08/31/15 Unstageable - Full thickness tissue loss in which the base of the ulcer is covered by slough (yellow, tan, gray, green or brown) and/or eschar (tan, brown or black) in the wound bed. same pressure ulcer as previously documented. 3 (Active)  Dressing Type ABD;Gauze (Comment);Moist to dry 09/18/2015  9:34 AM  Dressing Changed;Clean;Dry;Intact 09/18/2015  9:34 AM  Dressing Change Frequency Daily 09/18/2015  9:34 AM  State of Healing Eschar 09/18/2015  9:34 AM  Site / Wound Assessment Pink;Yellow 09/18/2015  9:34 AM  % Wound base Red or Granulating 25% 09/18/2015  9:34 AM  % Wound base Yellow 65% 09/18/2015  9:34 AM  % Wound base Black 10% 09/18/2015  9:34 AM  % Wound base Other (Comment) 0% 09/18/2015  9:34 AM  Peri-wound Assessment Intact 09/18/2015  9:34 AM  Wound Length (cm) 3.5 cm 09/17/2015  9:06 AM  Wound Width (cm) 6 cm 09/17/2015  9:06 AM  Wound Depth (cm) 0.1 cm 09/17/2015  9:06 AM  Tunneling (cm) 0 09/07/2015  4:00 AM  Undermining (cm) 0 09/07/2015  4:00 AM  Margins Unattached edges (unapproximated) 09/18/2015  9:34 AM  Drainage Amount Moderate 09/18/2015  9:34 AM  Drainage Description Serosanguineous 09/18/2015  9:34 AM  Treatment Debridement (Selective);Hydrotherapy (Pulse lavage);Packing (Saline gauze) 09/18/2015  9:34 AM  Santyl applied to wound bed prior to applying dressing.   Hydrotherapy Pulsed lavage therapy - wound location: sacrum Pulsed Lavage with Suction  (psi): 12 psi Pulsed Lavage with Suction - Normal Saline Used: 1000 mL Pulsed Lavage Tip: Tip with splash shield Selective Debridement Selective Debridement - Location: sacrum Selective Debridement - Tools Used: Forceps;Scalpel Selective Debridement - Tissue Removed: yellow and brown/black necrotic tissue   Wound Assessment and Plan  Wound Therapy - Assess/Plan/Recommendations Wound Therapy - Clinical Statement: Pt will benefit from continued hydrotherapy for selective removal of necrotic tissue and promote wound healing. Wound Therapy - Functional Problem List: Decr mobility and sitting time Factors Delaying/Impairing Wound Healing: Incontinence;Immobility;Multiple medical problems;Polypharmacy Hydrotherapy Plan: Debridement;Dressing change;Patient/family education;Pulsatile lavage with suction Wound Therapy - Frequency: 6X / week Wound Therapy - Follow Up Recommendations: Skilled nursing facility Wound Plan: See above  Wound Therapy Goals- Improve the function of patient's integumentary system by progressing the wound(s) through the phases of wound healing (inflammation - proliferation - remodeling) by: Decrease Necrotic Tissue to: 50 Decrease Necrotic Tissue - Progress: Progressing toward goal Increase Granulation Tissue to: 50 Increase Granulation Tissue - Progress: Progressing toward goal Goals/treatment plan/discharge plan were made with and agreed upon by patient/family: No, Patient unable to participate in goals/treatment/discharge plan and family unavailable Time For Goal Achievement: 7 days Wound Therapy - Potential for Goals: Good  Goals will be updated until maximal potential achieved or discharge criteria met.  Discharge criteria: when goals achieved, discharge from hospital, MD decision/surgical intervention, no progress towards goals, refusal/missing three consecutive treatments without notification or medical reason.  GP     Rolinda Roan 09/18/2015, 10:31  AM  Rolinda Roan, PT, DPT Acute Rehabilitation Services  Pager: [phone]
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
## 2015-09-18 NOTE — Consult Note (Signed)
Ref: No primary care provider on file.   Subjective:  Resting post clean-up. T max 101.1 degree F.  Objective:  Vital Signs in the last 24 hours: Temp:  [98.4 F (36.9 C)-101.1 F (38.4 C)] 98.6 F (37 C) (05/29 1610) Pulse Rate:  [94-137] 95 (05/29 9604) Cardiac Rhythm:  [-] Normal sinus rhythm (05/29 0700) Resp:  [18-20] 20 (05/29 0632) BP: (138-169)/(86-108) 138/94 mmHg (05/29 0632) SpO2:  [94 %-97 %] 95 % (05/29 5409) Weight:  [88.497 kg (195 lb 1.6 oz)] 88.497 kg (195 lb 1.6 oz) (05/29 0455)  Physical Exam: BP Readings from Last 1 Encounters:  09/18/15 138/94    Wt Readings from Last 1 Encounters:  09/18/15 88.497 kg (195 lb 1.6 oz)    Weight change: -1.497 kg (-3 lb 4.8 oz)  HEENT: [place]/AT, Eyes-Conjunctiva-Pink, Sclera-Non-icteric Neck: No JVD, No bruit, Trachea midline. Lungs:  Clear, Bilateral. Cardiac:  Regular rhythm, normal S1 and S2, no S3.  Abdomen:  Soft, non-tender. Extremities:  No edema present. No cyanosis. No clubbing. CNS: AxOx0,  Skin: Warm and dry.   Intake/Output from previous day: 05/28 0701 - 05/29 0700 In: 3933 [NG/GT:2124] Out: 3381 [Urine:3380; Stool:1]    Lab Results: BMET    Component Value Date/Time   NA 139 09/16/2015 0830   NA 142 09/15/2015 0435   NA 139 09/09/2015 0455   K 3.6 09/16/2015 0830   K 3.6 09/15/2015 0435   K 3.9 09/09/2015 0455   CL 103 09/16/2015 0830   CL 105 09/15/2015 0435   CL 101 09/09/2015 0455   CO2 28 09/16/2015 0830   CO2 28 09/15/2015 0435   CO2 29 09/09/2015 0455   GLUCOSE 116* 09/16/2015 0830   GLUCOSE 130* 09/15/2015 0435   GLUCOSE 99 09/09/2015 0455   BUN 23* 09/16/2015 0830   BUN 22* 09/15/2015 0435   BUN 18 09/09/2015 0455   CREATININE 0.52* 09/16/2015 0830   CREATININE 0.49* 09/15/2015 0435   CREATININE 0.51* 09/09/2015 0455   CALCIUM 9.0 09/16/2015 0830   CALCIUM 9.1 09/15/2015 0435   CALCIUM 9.3 09/09/2015 0455   GFRNONAA >60 09/16/2015 0830   GFRNONAA >60 09/15/2015 0435   GFRNONAA >60 09/09/2015 0455   GFRAA >60 09/16/2015 0830   GFRAA >60 09/15/2015 0435   GFRAA >60 09/09/2015 0455   CBC    Component Value Date/Time   WBC 9.8 09/16/2015 0830   RBC 4.08* 09/16/2015 0830   HGB 11.9* 09/16/2015 0830   HCT 38.5* 09/16/2015 0830   PLT 242 09/16/2015 0830   MCV 94.4 09/16/2015 0830   MCH 29.2 09/16/2015 0830   MCHC 30.9 09/16/2015 0830   RDW 14.6 09/16/2015 0830   LYMPHSABS 2.9 09/16/2015 0830   MONOABS 0.6 09/16/2015 0830   EOSABS 0.2 09/16/2015 0830   BASOSABS 0.0 09/16/2015 0830   HEPATIC Function Panel  Recent Labs  07/30/15 0735 08/07/15 0415 08/22/15 0346  PROT 4.9* 4.7* 5.8*   HEMOGLOBIN A1C No components found for: HGA1C,  MPG CARDIAC ENZYMES No results found for: CKTOTAL, CKMB, CKMBINDEX, TROPONINI BNP No results for input(s): PROBNP in the last 8760 hours. TSH No results for input(s): TSH in the last 8760 hours. CHOLESTEROL No results for input(s): CHOL in the last 8760 hours.  Scheduled Meds: . antiseptic oral rinse  7 mL Mouth Rinse 10 times per day  . Chlorhexidine Gluconate Cloth  6 each Topical Q0600  . collagenase   Topical Daily  . diltiazem  60 mg Per Tube Q6H  . enoxaparin (  LOVENOX) injection  30 mg Subcutaneous Q12H  . folic acid  1 mg Per Tube Daily  . free water  100 mL Per Tube Q4H  . metoprolol tartrate  50 mg Per Tube Q8H  . mupirocin ointment  1 application Nasal BID  . pantoprazole sodium  40 mg Per Tube Daily  . sodium chloride flush  10-40 mL Intracatheter Q12H  . thiamine  100 mg Per Tube Daily   Continuous Infusions: . feeding supplement (PIVOT 1.5 CAL) 1,000 mL (09/18/15 0534)   PRN Meds:.acetaminophen (TYLENOL) oral liquid 160 mg/5 mL, bisacodyl, haloperidol lactate, hydrALAZINE, HYDROcodone-acetaminophen, ipratropium-albuterol, loperamide, morphine injection, ondansetron **OR** ondansetron (ZOFRAN) IV, sodium chloride flush  Assessment/Plan: Atrial fibrillation with rapid ventricular  response-resolved Severe head trauma with encephalopathy Sinus tachycardia-improved Fever with possible left lung pneumonia UTI-resolving   Increase metoprolol dose.   LOS: 50 days    Orpah CobbAjay Sangeeta Youse  MD  09/18/2015, 11:04 AM

## 2015-09-19 ENCOUNTER — Inpatient Hospital Stay (HOSPITAL_COMMUNITY): Payer: BLUE CROSS/BLUE SHIELD

## 2015-09-19 DIAGNOSIS — L89154 Pressure ulcer of sacral region, stage 4: Secondary | ICD-10-CM | POA: Diagnosis not present

## 2015-09-19 LAB — GLUCOSE, CAPILLARY
GLUCOSE-CAPILLARY: 128 mg/dL — AB (ref 65–99)
GLUCOSE-CAPILLARY: 129 mg/dL — AB (ref 65–99)
GLUCOSE-CAPILLARY: 134 mg/dL — AB (ref 65–99)
Glucose-Capillary: 111 mg/dL — ABNORMAL HIGH (ref 65–99)
Glucose-Capillary: 119 mg/dL — ABNORMAL HIGH (ref 65–99)
Glucose-Capillary: 137 mg/dL — ABNORMAL HIGH (ref 65–99)
Glucose-Capillary: 139 mg/dL — ABNORMAL HIGH (ref 65–99)

## 2015-09-19 MED ORDER — IPRATROPIUM-ALBUTEROL 0.5-2.5 (3) MG/3ML IN SOLN: 3.0000 mL | Freq: Four times a day (QID) | RESPIRATORY_TRACT | Status: AC | PRN

## 2015-09-19 MED ORDER — HYDROCODONE-ACETAMINOPHEN 7.5-325 MG/15ML PO SOLN: 10.0000 mL | Freq: Four times a day (QID) | ORAL | Status: DC | PRN

## 2015-09-19 MED ORDER — THIAMINE HCL 100 MG PO TABS: 100.0000 mg | ORAL_TABLET | Freq: Every day | ORAL | Status: DC

## 2015-09-19 MED ORDER — PIVOT 1.5 CAL PO LIQD
1000.0000 mL | ORAL | Status: DC
Start: 2015-09-19 — End: 2016-05-15

## 2015-09-19 MED ORDER — MUPIROCIN 2 % EX OINT: 1.0000 "application " | TOPICAL_OINTMENT | Freq: Two times a day (BID) | CUTANEOUS | Status: AC

## 2015-09-19 MED ORDER — CHLORHEXIDINE GLUCONATE CLOTH 2 % EX PADS: 6.0000 | MEDICATED_PAD | Freq: Every day | CUTANEOUS | Status: AC

## 2015-09-19 MED ORDER — BISACODYL 10 MG RE SUPP: 10.0000 mg | Freq: Every day | RECTAL | Status: DC | PRN

## 2015-09-19 MED ORDER — FREE WATER: 100.0000 mL | Status: DC

## 2015-09-19 MED ORDER — COLLAGENASE 250 UNIT/GM EX OINT: TOPICAL_OINTMENT | Freq: Every day | CUTANEOUS | Status: AC

## 2015-09-19 MED ORDER — DILTIAZEM 12 MG/ML ORAL SUSPENSION: 60.0000 mg | Freq: Four times a day (QID) | ORAL | Status: DC

## 2015-09-19 MED ORDER — METOPROLOL TARTRATE 25 MG/10 ML ORAL SUSPENSION: 50.0000 mg | Freq: Three times a day (TID) | ORAL | Status: DC

## 2015-09-19 MED ORDER — LOPERAMIDE HCL 1 MG/5ML PO LIQD: 2.0000 mg | Freq: Two times a day (BID) | ORAL | Status: DC | PRN

## 2015-09-19 MED ORDER — ACETAMINOPHEN 160 MG/5ML PO SOLN: 650.0000 mg | Freq: Four times a day (QID) | ORAL | Status: DC | PRN

## 2015-09-19 MED ORDER — FOLIC ACID 1 MG PO TABS: 1.0000 mg | ORAL_TABLET | Freq: Every day | ORAL | Status: DC
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
## 2015-09-19 NOTE — Care Management Note (Addendum)
Case Management Note  Patient Details  Name: [name] MRN: [id number] Date of Birth: 07-18-1965  Subjective/Objective:   Pt medically stable for dc today.                   Action/Plan: Plan dc to SNF today, per CSW arrangements.    Expected Discharge Date:   09/20/15               Expected Discharge Plan:  Skilled Nursing Facility  In-House Referral:  Clinical Social Work  Discharge planning Services  CM Consult  Post Acute Care Choice:    Choice offered to:     DME Arranged:    DME Agency:     HH Arranged:    HH Agency:     Status of Service:  Completed, signed off  Medicare Important Message Given:    Date Medicare IM Given:    Medicare IM give by:    Date Additional Medicare IM Given:    Additional Medicare Important Message give by:     If discussed at Long Length of Stay Meetings, dates discussed:    Additional Comments:  Completed and faxed short term disability paperwork faxed to patient's payor, per wife's request.  Original copies given to pt's wife, Olegario MessierKathy.  She is tearful today; states pt not as interactive with her.  Provided emotional support to Mrs. Hedy JacobGrainger.    Quintella BatonJulie W. Kasaundra Fahrney, RN, BSN  Trauma/Neuro ICU Case Manager [phone]
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
## 2015-09-19 NOTE — Clinical Social Work Note (Signed)
Clinical Social Worker continuing to follow patient and family for support and discharge planning needs.  CSW working with patient wife, facility, and insurance company to provide adequate placement for patient at discharge.  CSW has made arrangements for discharge tomorrow (5/31).  CSW remains available for support and to facilitate patient discharge needs.  Macario GoldsJesse Stpehanie [place]roy, [place] [phone]
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
## 2015-09-19 NOTE — Progress Notes (Addendum)
Physical Therapy Wound Treatment Patient Details  Name: [name] MRN: [id number] Date of Birth: Apr 19, 1966  Today's Date: 09/19/2015 Time: 0850-0930 Time Calculation (min): 40 min  Subjective  Subjective: nonverbal Patient and Family Stated Goals: Unable to state Date of Onset:  (in hospital) Prior Treatments: dressing changes  Pain Score: Pt did not indicate pain throughout treatment   Wound Assessment  Pressure Ulcer 08/31/15 Unstageable - Full thickness tissue loss in which the base of the ulcer is covered by slough (yellow, tan, gray, green or brown) and/or eschar (tan, brown or black) in the wound bed. same pressure ulcer as previously documented. 3 (Active)  Dressing Type ABD;Gauze (Comment);Moist to dry 09/19/2015  9:37 AM  Dressing Changed;Clean;Dry;Intact 09/19/2015  9:37 AM  Dressing Change Frequency Daily 09/19/2015  9:37 AM  State of Healing Eschar 09/19/2015  9:37 AM  Site / Wound Assessment Pink;Yellow 09/19/2015  9:37 AM  % Wound base Red or Granulating 30% 09/19/2015  9:37 AM  % Wound base Yellow 60% 09/19/2015  9:37 AM  % Wound base Black 10% 09/19/2015  9:37 AM  % Wound base Other (Comment) 0% 09/19/2015  9:37 AM  Peri-wound Assessment Intact 09/19/2015  9:37 AM  Wound Length (cm) 3.5 cm 09/17/2015  9:06 AM  Wound Width (cm) 6 cm 09/17/2015  9:06 AM  Wound Depth (cm) 0.1 cm 09/17/2015  9:06 AM  Tunneling (cm) 0 09/07/2015  4:00 AM  Undermining (cm) 0 09/07/2015  4:00 AM  Margins Unattached edges (unapproximated) 09/19/2015  9:37 AM  Drainage Amount Moderate 09/19/2015  9:37 AM  Drainage Description Serosanguineous 09/19/2015  9:37 AM  Treatment Debridement (Selective);Hydrotherapy (Pulse lavage);Packing (Saline gauze) 09/19/2015  9:37 AM  Santyl applied to wound bed prior to applying dressing.   Hydrotherapy Pulsed lavage therapy - wound location: sacrum Pulsed Lavage with Suction (psi): 12 psi Pulsed Lavage with Suction - Normal Saline Used: 1000 mL Pulsed Lavage  Tip: Tip with splash shield Selective Debridement Selective Debridement - Location: sacrum Selective Debridement - Tools Used: Forceps;Scalpel Selective Debridement - Tissue Removed: yellow and brown/black necrotic tissue   Wound Assessment and Plan  Wound Therapy - Assess/Plan/Recommendations Wound Therapy - Clinical Statement: Pt will benefit from continued hydrotherapy for selective removal of necrotic tissue and promote wound healing. Wound Therapy - Functional Problem List: Decr mobility and sitting time Factors Delaying/Impairing Wound Healing: Incontinence;Immobility;Multiple medical problems;Polypharmacy Hydrotherapy Plan: Debridement;Dressing change;Patient/family education;Pulsatile lavage with suction Wound Therapy - Frequency: 6X / week Wound Therapy - Follow Up Recommendations: Skilled nursing facility Wound Plan: See above  Wound Therapy Goals- Improve the function of patient's integumentary system by progressing the wound(s) through the phases of wound healing (inflammation - proliferation - remodeling) by: Decrease Necrotic Tissue to: 50 Decrease Necrotic Tissue - Progress: Progressing toward goal Increase Granulation Tissue to: 50 Increase Granulation Tissue - Progress: Progressing toward goal Goals/treatment plan/discharge plan were made with and agreed upon by patient/family: No, Patient unable to participate in goals/treatment/discharge plan and family unavailable Time For Goal Achievement: 7 days Wound Therapy - Potential for Goals: Good  Goals will be updated until maximal potential achieved or discharge criteria met.  Discharge criteria: when goals achieved, discharge from hospital, MD decision/surgical intervention, no progress towards goals, refusal/missing three consecutive treatments without notification or medical reason.  GP     Rolinda Roan 09/19/2015, 9:41 AM  Rolinda Roan, PT, DPT Acute Rehabilitation Services Pager: [phone]

## 2015-09-20 LAB — GLUCOSE, CAPILLARY
GLUCOSE-CAPILLARY: 135 mg/dL — AB (ref 65–99)
GLUCOSE-CAPILLARY: 127 mg/dL — AB (ref 65–99)
Glucose-Capillary: 128 mg/dL — ABNORMAL HIGH (ref 65–99)
Glucose-Capillary: 139 mg/dL — ABNORMAL HIGH (ref 65–99)
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
## 2015-09-20 NOTE — Progress Notes (Signed)
[facility] Discharge paperwork sent prior to transfer. Foley secure and patent. PICC to right upper arm removed, dry dressing applied. Neck collar and brace on and aligned. Report called in to nurse, Alona BeneJoyce.
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
## 2015-09-20 NOTE — Progress Notes (Signed)
Pt turned and repositioned. Does not follow commands. No noted distress.  Purposeful movement. Abdominal binder on and in place. Will continue to monitor.

## 2015-09-20 NOTE — Progress Notes (Signed)
Physical Therapy Wound Treatment Patient Details  Name: [name] MRN: [id number] Date of Birth: [birth year]  Today's Date: 09/20/2015 Time: 0930-1010 Time Calculation (min): 40 min  Subjective  Subjective: nonverbal Patient and Family Stated Goals: Unable to state Date of Onset:  (in hospital) Prior Treatments: dressing changes  Pain Score: Pt did not indicate any pain throughout session. Was asleep for a portion of the session.  Wound Assessment  Pressure Ulcer 08/31/15 Unstageable - Full thickness tissue loss in which the base of the ulcer is covered by slough (yellow, tan, gray, green or brown) and/or eschar (tan, brown or black) in the wound bed. same pressure ulcer as previously documented. 3 (Active)  Dressing Type ABD;Gauze (Comment);Moist to dry 09/20/2015 10:35 AM  Dressing Changed;Clean;Dry;Intact 09/20/2015 10:35 AM  Dressing Change Frequency Daily 09/20/2015 10:35 AM  State of Healing Eschar 09/20/2015 10:35 AM  Site / Wound Assessment Pink;Yellow 09/20/2015 10:35 AM  % Wound base Red or Granulating 30% 09/20/2015 10:35 AM  % Wound base Yellow 60% 09/20/2015 10:35 AM  % Wound base Black 10% 09/20/2015 10:35 AM  % Wound base Other (Comment) 0% 09/20/2015 10:35 AM  Peri-wound Assessment Intact 09/20/2015 10:35 AM  Wound Length (cm) 3.5 cm 09/17/2015  9:06 AM  Wound Width (cm) 6 cm 09/17/2015  9:06 AM  Wound Depth (cm) 0.1 cm 09/17/2015  9:06 AM  Tunneling (cm) 0 09/07/2015  4:00 AM  Undermining (cm) 0 09/07/2015  4:00 AM  Margins Unattached edges (unapproximated) 09/20/2015 10:35 AM  Drainage Amount Moderate 09/20/2015 10:35 AM  Drainage Description Serosanguineous 09/20/2015 10:35 AM  Treatment Debridement (Selective);Hydrotherapy (Pulse lavage);Packing (Saline gauze) 09/20/2015 10:35 AM  Santyl applied to wound bed prior to applying dressing.   Hydrotherapy Pulsed lavage therapy - wound location: sacrum Pulsed Lavage with Suction (psi): 12 psi Pulsed Lavage with Suction -  Normal Saline Used: 1000 mL Pulsed Lavage Tip: Tip with splash shield Selective Debridement Selective Debridement - Location: sacrum Selective Debridement - Tools Used: Forceps;Scalpel Selective Debridement - Tissue Removed: yellow and brown/black necrotic tissue   Wound Assessment and Plan  Wound Therapy - Assess/Plan/Recommendations Wound Therapy - Clinical Statement: Pt will benefit from continued hydrotherapy for selective removal of necrotic tissue and promote wound healing. Wound Therapy - Functional Problem List: Decr mobility and sitting time Factors Delaying/Impairing Wound Healing: Incontinence;Immobility;Multiple medical problems;Polypharmacy Hydrotherapy Plan: Debridement;Dressing change;Patient/family education;Pulsatile lavage with suction Wound Therapy - Frequency: 6X / week Wound Therapy - Follow Up Recommendations: Skilled nursing facility Wound Plan: See above  Wound Therapy Goals- Improve the function of patient's integumentary system by progressing the wound(s) through the phases of wound healing (inflammation - proliferation - remodeling) by: Decrease Necrotic Tissue to: 50 Decrease Necrotic Tissue - Progress: Progressing toward goal Increase Granulation Tissue to: 50 Increase Granulation Tissue - Progress: Progressing toward goal Goals/treatment plan/discharge plan were made with and agreed upon by patient/family: No, Patient unable to participate in goals/treatment/discharge plan and family unavailable Time For Goal Achievement: 7 days Wound Therapy - Potential for Goals: Good  Goals will be updated until maximal potential achieved or discharge criteria met.  Discharge criteria: when goals achieved, discharge from hospital, MD decision/surgical intervention, no progress towards goals, refusal/missing three consecutive treatments without notification or medical reason.  GP     Rolinda Roan 09/20/2015, 10:37 AM   Rolinda Roan, PT, DPT Acute Rehabilitation  Services Pager: [phone]

## 2015-09-20 NOTE — Clinical Social Work Placement (Addendum)
   CLINICAL SOCIAL WORK PLACEMENT  NOTE  Date:  09/20/2015  Patient Details  Name: William NordmannDonald Wayne Lineman MRN: [id number] Date of Birth: March 22, 1966  Clinical Social Work is seeking post-discharge placement for this patient at the Skilled  Nursing Facility level of care (*CSW will initial, date and re-position this form in  chart as items are completed):  Yes   Patient/family provided with [facility] Clinical Social Work Department's list of facilities offering this level of care within the geographic area requested by the patient (or if unable, by the patient's family).  Yes   Patient/family informed of their freedom to choose among providers that offer the needed level of care, that participate in Medicare, Medicaid or managed care program needed by the patient, have an available bed and are willing to accept the patient.  Yes   Patient/family informed of [facility]'s ownership interest in [facility] and [facility], as well as of the fact that they are under no obligation to receive care at these facilities.  PASRR submitted to EDS on 09/12/15     PASRR number received on 09/12/15     Existing PASRR number confirmed on       FL2 transmitted to all facilities in geographic area requested by pt/family on 09/12/15     FL2 transmitted to all facilities within larger geographic area on 09/12/15     Patient informed that his/her managed care company has contracts with or will negotiate with certain facilities, including the following:        Yes   Patient/family informed of bed offers received.  [facility]     Physician recommends and patient chooses bed at      [facility] on 09/20/15.  Patient to be transferred to facility by Ambulance     Patient family notified on 09/20/15 of transfer.  Name of family member notified:  Olegario MessierKathy     PHYSICIAN Please sign FL2, Please prepare priority  discharge summary, including medications, Please prepare prescriptions     Additional Comment:  Per MD patient ready [facility]. RN, patient, patient's family, and facility notified of DC. RN given number for report. DC packet on chart. Ambulance transport requested for patient for 4:30PM pickup. CSW signing off.   _______________________________________________ Venita Lickampbell, Apoorva Bugay B, LCSW 09/20/2015, 3:25 PM
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
## 2015-09-20 NOTE — Progress Notes (Signed)
Pt to be transferred to SNF this afternoon. New verbal order received from PA to remove right upper arm PICC. IV team consult order placed for removal. Pt to be transported with foley per PA.

## 2015-10-05 ENCOUNTER — Other Ambulatory Visit (HOSPITAL_COMMUNITY): Payer: Self-pay | Admitting: Interventional Radiology

## 2015-10-05 ENCOUNTER — Ambulatory Visit (HOSPITAL_COMMUNITY)
Admission: RE | Admit: 2015-10-05 | Discharge: 2015-10-05 | Disposition: A | Discharge status: Home / Self Care | Payer: BLUE CROSS/BLUE SHIELD | Source: Ambulatory Visit | Attending: Interventional Radiology | Admitting: Interventional Radiology

## 2015-10-05 DIAGNOSIS — Z431 Encounter for attention to gastrostomy: Secondary | ICD-10-CM | POA: Diagnosis present

## 2015-10-05 DIAGNOSIS — R1310 Dysphagia, unspecified: Secondary | ICD-10-CM

## 2015-10-05 MED ORDER — IOPAMIDOL (ISOVUE-300) INJECTION 61%
INTRAVENOUS | Status: AC
  Administered 2015-10-05: 10 mL
  Filled 2015-10-05: qty 50

## 2015-10-05 MED ORDER — LIDOCAINE VISCOUS 2 % MT SOLN
OROMUCOSAL | Status: AC
  Filled 2015-10-05: qty 15

## 2015-10-05 MED ORDER — LIDOCAINE VISCOUS 2 % MT SOLN
OROMUCOSAL | Status: DC | PRN
  Administered 2015-10-05: 10 mL via OROMUCOSAL

## 2015-10-05 NOTE — Procedures (Signed)
G tube replaced 18 fr No comp

## 2016-03-11 DIAGNOSIS — N319 Neuromuscular dysfunction of bladder, unspecified: Secondary | ICD-10-CM

## 2016-03-11 DIAGNOSIS — G8220 Paraplegia, unspecified: Secondary | ICD-10-CM

## 2016-03-11 DIAGNOSIS — L89304 Pressure ulcer of unspecified buttock, stage 4: Secondary | ICD-10-CM

## 2016-03-11 DIAGNOSIS — J90 Pleural effusion, not elsewhere classified: Secondary | ICD-10-CM | POA: Diagnosis not present

## 2016-03-11 DIAGNOSIS — S069X9A Unspecified intracranial injury with loss of consciousness of unspecified duration, initial encounter: Secondary | ICD-10-CM

## 2016-03-11 DIAGNOSIS — E43 Unspecified severe protein-calorie malnutrition: Secondary | ICD-10-CM

## 2016-03-11 DIAGNOSIS — N390 Urinary tract infection, site not specified: Secondary | ICD-10-CM

## 2016-03-11 DIAGNOSIS — I10 Essential (primary) hypertension: Secondary | ICD-10-CM

## 2016-03-11 DIAGNOSIS — J181 Lobar pneumonia, unspecified organism: Secondary | ICD-10-CM

## 2016-03-11 DIAGNOSIS — Z933 Colostomy status: Secondary | ICD-10-CM | POA: Diagnosis not present

## 2016-03-11 DIAGNOSIS — A419 Sepsis, unspecified organism: Secondary | ICD-10-CM

## 2016-03-11 DIAGNOSIS — G8929 Other chronic pain: Secondary | ICD-10-CM

## 2016-03-12 ENCOUNTER — Encounter (HOSPITAL_COMMUNITY): Payer: Self-pay | Admitting: Internal Medicine

## 2016-03-12 ENCOUNTER — Inpatient Hospital Stay (HOSPITAL_COMMUNITY): Payer: BLUE CROSS/BLUE SHIELD

## 2016-03-12 ENCOUNTER — Inpatient Hospital Stay (HOSPITAL_COMMUNITY)
Admission: AD | Admit: 2016-03-12 | Discharge: 2016-03-26 | DRG: 871 | Disposition: A | Discharge status: Home Health | Payer: BLUE CROSS/BLUE SHIELD | Source: Other Acute Inpatient Hospital | Attending: Internal Medicine | Admitting: Internal Medicine

## 2016-03-12 DIAGNOSIS — L89004 Pressure ulcer of unspecified elbow, stage 4: Secondary | ICD-10-CM | POA: Diagnosis not present

## 2016-03-12 DIAGNOSIS — N319 Neuromuscular dysfunction of bladder, unspecified: Secondary | ICD-10-CM | POA: Diagnosis present

## 2016-03-12 DIAGNOSIS — G8220 Paraplegia, unspecified: Secondary | ICD-10-CM | POA: Diagnosis present

## 2016-03-12 DIAGNOSIS — Z981 Arthrodesis status: Secondary | ICD-10-CM

## 2016-03-12 DIAGNOSIS — L89144 Pressure ulcer of left lower back, stage 4: Secondary | ICD-10-CM | POA: Diagnosis present

## 2016-03-12 DIAGNOSIS — A419 Sepsis, unspecified organism: Principal | ICD-10-CM | POA: Diagnosis present

## 2016-03-12 DIAGNOSIS — I10 Essential (primary) hypertension: Secondary | ICD-10-CM | POA: Diagnosis present

## 2016-03-12 DIAGNOSIS — L89134 Pressure ulcer of right lower back, stage 4: Secondary | ICD-10-CM | POA: Diagnosis present

## 2016-03-12 DIAGNOSIS — I4891 Unspecified atrial fibrillation: Secondary | ICD-10-CM | POA: Diagnosis present

## 2016-03-12 DIAGNOSIS — Z87440 Personal history of urinary (tract) infections: Secondary | ICD-10-CM | POA: Diagnosis not present

## 2016-03-12 DIAGNOSIS — Z4682 Encounter for fitting and adjustment of non-vascular catheter: Secondary | ICD-10-CM

## 2016-03-12 DIAGNOSIS — L89154 Pressure ulcer of sacral region, stage 4: Secondary | ICD-10-CM | POA: Diagnosis present

## 2016-03-12 DIAGNOSIS — F329 Major depressive disorder, single episode, unspecified: Secondary | ICD-10-CM

## 2016-03-12 DIAGNOSIS — J852 Abscess of lung without pneumonia: Secondary | ICD-10-CM | POA: Diagnosis present

## 2016-03-12 DIAGNOSIS — D638 Anemia in other chronic diseases classified elsewhere: Secondary | ICD-10-CM | POA: Diagnosis present

## 2016-03-12 DIAGNOSIS — S069XAA Unspecified intracranial injury with loss of consciousness status unknown, initial encounter: Secondary | ICD-10-CM | POA: Diagnosis present

## 2016-03-12 DIAGNOSIS — M6250 Muscle wasting and atrophy, not elsewhere classified, unspecified site: Secondary | ICD-10-CM | POA: Diagnosis present

## 2016-03-12 DIAGNOSIS — Z9689 Presence of other specified functional implants: Secondary | ICD-10-CM

## 2016-03-12 DIAGNOSIS — R634 Abnormal weight loss: Secondary | ICD-10-CM | POA: Diagnosis present

## 2016-03-12 DIAGNOSIS — J189 Pneumonia, unspecified organism: Secondary | ICD-10-CM | POA: Diagnosis not present

## 2016-03-12 DIAGNOSIS — Z933 Colostomy status: Secondary | ICD-10-CM | POA: Diagnosis not present

## 2016-03-12 DIAGNOSIS — L8990 Pressure ulcer of unspecified site, unspecified stage: Secondary | ICD-10-CM | POA: Diagnosis present

## 2016-03-12 DIAGNOSIS — Z7401 Bed confinement status: Secondary | ICD-10-CM | POA: Diagnosis not present

## 2016-03-12 DIAGNOSIS — J90 Pleural effusion, not elsewhere classified: Secondary | ICD-10-CM

## 2016-03-12 DIAGNOSIS — J869 Pyothorax without fistula: Secondary | ICD-10-CM

## 2016-03-12 DIAGNOSIS — M4628 Osteomyelitis of vertebra, sacral and sacrococcygeal region: Secondary | ICD-10-CM | POA: Diagnosis present

## 2016-03-12 DIAGNOSIS — L98429 Non-pressure chronic ulcer of back with unspecified severity: Secondary | ICD-10-CM

## 2016-03-12 DIAGNOSIS — Z87820 Personal history of traumatic brain injury: Secondary | ICD-10-CM

## 2016-03-12 DIAGNOSIS — F17210 Nicotine dependence, cigarettes, uncomplicated: Secondary | ICD-10-CM | POA: Diagnosis not present

## 2016-03-12 DIAGNOSIS — F482 Pseudobulbar affect: Secondary | ICD-10-CM

## 2016-03-12 DIAGNOSIS — S069X9A Unspecified intracranial injury with loss of consciousness of unspecified duration, initial encounter: Secondary | ICD-10-CM | POA: Diagnosis present

## 2016-03-12 DIAGNOSIS — J181 Lobar pneumonia, unspecified organism: Secondary | ICD-10-CM | POA: Diagnosis not present

## 2016-03-12 DIAGNOSIS — R000 Tachycardia, unspecified: Secondary | ICD-10-CM

## 2016-03-12 DIAGNOSIS — R52 Pain, unspecified: Secondary | ICD-10-CM

## 2016-03-12 DIAGNOSIS — Z515 Encounter for palliative care: Secondary | ICD-10-CM | POA: Diagnosis not present

## 2016-03-12 DIAGNOSIS — B9689 Other specified bacterial agents as the cause of diseases classified elsewhere: Secondary | ICD-10-CM | POA: Diagnosis present

## 2016-03-12 DIAGNOSIS — S069X0S Unspecified intracranial injury without loss of consciousness, sequela: Secondary | ICD-10-CM | POA: Diagnosis not present

## 2016-03-12 DIAGNOSIS — E876 Hypokalemia: Secondary | ICD-10-CM | POA: Diagnosis present

## 2016-03-12 DIAGNOSIS — Z79899 Other long term (current) drug therapy: Secondary | ICD-10-CM | POA: Diagnosis not present

## 2016-03-12 HISTORY — DX: Unspecified intracranial injury with loss of consciousness of unspecified duration, initial encounter: S06.9X9A

## 2016-03-12 HISTORY — DX: Depression, unspecified: F32.A

## 2016-03-12 HISTORY — DX: Essential (primary) hypertension: I10

## 2016-03-12 HISTORY — DX: Major depressive disorder, single episode, unspecified: F32.9

## 2016-03-12 HISTORY — DX: Pyothorax without fistula: J86.9

## 2016-03-12 HISTORY — DX: Abscess of lung without pneumonia: J85.2

## 2016-03-12 HISTORY — DX: Paraplegia, unspecified: G82.20

## 2016-03-12 HISTORY — DX: Pressure ulcer of unspecified site, unspecified stage: L89.90

## 2016-03-12 HISTORY — DX: Anemia in other chronic diseases classified elsewhere: D63.8

## 2016-03-12 LAB — CBC WITH DIFFERENTIAL/PLATELET
Basophils Absolute: 0 10*3/uL (ref 0.0–0.1)
Basophils Relative: 0 %
Eosinophils Absolute: 0.1 10*3/uL (ref 0.0–0.7)
Eosinophils Relative: 1 %
HEMATOCRIT: 30.7 % — AB (ref 39.0–52.0)
HEMOGLOBIN: 8.8 g/dL — AB (ref 13.0–17.0)
LYMPHS ABS: 2.5 10*3/uL (ref 0.7–4.0)
LYMPHS PCT: 19 %
MCH: 23.3 pg — AB (ref 26.0–34.0)
MCHC: 28.7 g/dL — AB (ref 30.0–36.0)
MCV: 81.2 fL (ref 78.0–100.0)
MONOS PCT: 8 %
Monocytes Absolute: 1 10*3/uL (ref 0.1–1.0)
NEUTROS ABS: 9.3 10*3/uL — AB (ref 1.7–7.7)
NEUTROS PCT: 72 %
Platelets: 534 10*3/uL — ABNORMAL HIGH (ref 150–400)
RBC: 3.78 MIL/uL — ABNORMAL LOW (ref 4.22–5.81)
RDW: 17.7 % — ABNORMAL HIGH (ref 11.5–15.5)
WBC: 12.9 10*3/uL — ABNORMAL HIGH (ref 4.0–10.5)

## 2016-03-12 LAB — COMPREHENSIVE METABOLIC PANEL
ALK PHOS: 76 U/L (ref 38–126)
ALT: 22 U/L (ref 17–63)
ANION GAP: 10 (ref 5–15)
AST: 18 U/L (ref 15–41)
Albumin: 2.1 g/dL — ABNORMAL LOW (ref 3.5–5.0)
BILIRUBIN TOTAL: 0.8 mg/dL (ref 0.3–1.2)
BUN: 12 mg/dL (ref 6–20)
CHLORIDE: 100 mmol/L — AB (ref 101–111)
CO2: 26 mmol/L (ref 22–32)
Calcium: 9.4 mg/dL (ref 8.9–10.3)
Creatinine, Ser: 0.5 mg/dL — ABNORMAL LOW (ref 0.61–1.24)
GFR calc non Af Amer: >60 mL/min (ref >60)
Glucose, Bld: 100 mg/dL — ABNORMAL HIGH (ref 65–99)
Potassium: 3.7 mmol/L (ref 3.5–5.1)
Sodium: 136 mmol/L (ref 135–145)
Total Protein: 8.2 g/dL — ABNORMAL HIGH (ref 6.5–8.1)

## 2016-03-12 LAB — GLUCOSE, CAPILLARY: GLUCOSE-CAPILLARY: 85 mg/dL (ref 65–99)

## 2016-03-12 LAB — LACTIC ACID, PLASMA
Lactic Acid, Venous: 1.2 mmol/L (ref 0.5–1.9)
Lactic Acid, Venous: 0.8 mmol/L (ref 0.5–1.9)

## 2016-03-12 LAB — MRSA PCR SCREENING: MRSA BY PCR: NEGATIVE

## 2016-03-12 LAB — APTT: APTT: 33 s (ref 24–36)

## 2016-03-12 LAB — PROTIME-INR
INR: 1.16
PROTHROMBIN TIME: 14.9 s (ref 11.4–15.2)

## 2016-03-12 LAB — PROCALCITONIN: Procalcitonin: 0.18 ng/mL

## 2016-03-12 LAB — TSH: TSH: 2.495 u[IU]/mL (ref 0.350–4.500)

## 2016-03-12 MED ORDER — HEPARIN SODIUM (PORCINE) 5000 UNIT/ML IJ SOLN: 5000.0000 [IU] | Freq: Three times a day (TID) | INTRAMUSCULAR | Status: DC

## 2016-03-12 MED ORDER — OXYCODONE HCL 5 MG PO TABS
10.0000 mg | ORAL_TABLET | ORAL | Status: DC | PRN
Start: 2016-03-12 — End: 2016-03-26
  Administered 2016-03-13 – 2016-03-24 (×15): 10 mg via ORAL
  Filled 2016-03-12 (×16): qty 2

## 2016-03-12 MED ORDER — ACETAMINOPHEN 650 MG RE SUPP: 650.0000 mg | Freq: Four times a day (QID) | RECTAL | Status: DC | PRN

## 2016-03-12 MED ORDER — ONDANSETRON HCL 4 MG/2ML IJ SOLN: 4.0000 mg | Freq: Four times a day (QID) | INTRAMUSCULAR | Status: DC | PRN

## 2016-03-12 MED ORDER — PRO-STAT SUGAR FREE PO LIQD
30.0000 mL | Freq: Every day | ORAL | Status: DC
  Administered 2016-03-12 – 2016-03-26 (×12): 30 mL via ORAL
  Filled 2016-03-12 (×14): qty 30

## 2016-03-12 MED ORDER — IPRATROPIUM-ALBUTEROL 0.5-2.5 (3) MG/3ML IN SOLN: 3.0000 mL | Freq: Four times a day (QID) | RESPIRATORY_TRACT | Status: DC | PRN

## 2016-03-12 MED ORDER — COLLAGENASE 250 UNIT/GM EX OINT
TOPICAL_OINTMENT | Freq: Every day | CUTANEOUS | Status: DC
  Administered 2016-03-12 – 2016-03-20 (×8): via TOPICAL
  Administered 2016-03-22: 1 via TOPICAL
  Administered 2016-03-22 – 2016-03-25 (×4): via TOPICAL
  Filled 2016-03-12 (×3): qty 30

## 2016-03-12 MED ORDER — VANCOMYCIN HCL IN DEXTROSE 750-5 MG/150ML-% IV SOLN
750.0000 mg | Freq: Three times a day (TID) | INTRAVENOUS | Status: DC
  Administered 2016-03-12 – 2016-03-16 (×12): 750 mg via INTRAVENOUS
  Filled 2016-03-12 (×15): qty 150

## 2016-03-12 MED ORDER — SODIUM CHLORIDE 0.9 % IV BOLUS (SEPSIS)
2000.0000 mL | Freq: Once | INTRAVENOUS | Status: AC
  Administered 2016-03-12: 1000 mL via INTRAVENOUS

## 2016-03-12 MED ORDER — ENSURE ENLIVE PO LIQD
237.0000 mL | Freq: Two times a day (BID) | ORAL | Status: DC
  Administered 2016-03-13 – 2016-03-26 (×21): 237 mL via ORAL

## 2016-03-12 MED ORDER — ACETAMINOPHEN 325 MG PO TABS: 650.0000 mg | ORAL_TABLET | Freq: Four times a day (QID) | ORAL | Status: DC | PRN

## 2016-03-12 MED ORDER — ADULT MULTIVITAMIN W/MINERALS CH
1.0000 | ORAL_TABLET | Freq: Every day | ORAL | Status: DC
  Administered 2016-03-12 – 2016-03-26 (×12): 1 via ORAL
  Filled 2016-03-12 (×14): qty 1

## 2016-03-12 MED ORDER — VANCOMYCIN HCL IN DEXTROSE 1-5 GM/200ML-% IV SOLN
1000.0000 mg | Freq: Once | INTRAVENOUS | Status: AC
  Administered 2016-03-12: 1000 mg via INTRAVENOUS
  Filled 2016-03-12: qty 200

## 2016-03-12 MED ORDER — SODIUM CHLORIDE 0.9 % IV SOLN
INTRAVENOUS | Status: AC
  Administered 2016-03-12 – 2016-03-13 (×3): via INTRAVENOUS

## 2016-03-12 MED ORDER — VANCOMYCIN HCL IN DEXTROSE 1-5 GM/200ML-% IV SOLN
1000.0000 mg | Freq: Once | INTRAVENOUS | Status: DC
  Filled 2016-03-12: qty 200

## 2016-03-12 MED ORDER — PIPERACILLIN-TAZOBACTAM 3.375 G IVPB 30 MIN
3.3750 g | Freq: Once | INTRAVENOUS | Status: DC
  Filled 2016-03-12: qty 50

## 2016-03-12 MED ORDER — MORPHINE SULFATE (PF) 2 MG/ML IV SOLN
2.0000 mg | INTRAVENOUS | Status: DC | PRN
  Administered 2016-03-12 – 2016-03-25 (×14): 2 mg via INTRAVENOUS
  Filled 2016-03-12 (×17): qty 1

## 2016-03-12 MED ORDER — ONDANSETRON HCL 4 MG PO TABS: 4.0000 mg | ORAL_TABLET | Freq: Four times a day (QID) | ORAL | Status: DC | PRN

## 2016-03-12 MED ORDER — PIPERACILLIN-TAZOBACTAM 3.375 G IVPB
3.3750 g | Freq: Three times a day (TID) | INTRAVENOUS | Status: DC
  Administered 2016-03-12 – 2016-03-19 (×21): 3.375 g via INTRAVENOUS
  Filled 2016-03-12 (×23): qty 50

## 2016-03-12 MED ORDER — DILTIAZEM HCL 60 MG PO TABS: 60.0000 mg | ORAL_TABLET | Freq: Two times a day (BID) | ORAL | Status: DC

## 2016-03-12 MED ORDER — SODIUM CHLORIDE 0.9 % IV SOLN: INTRAVENOUS | Status: DC

## 2016-03-12 MED ORDER — METOPROLOL TARTRATE 25 MG PO TABS: 25.0000 mg | ORAL_TABLET | Freq: Three times a day (TID) | ORAL | Status: DC

## 2016-03-12 NOTE — Consult Note (Signed)
WOC Nurse wound consult note Reason for Consult: multiple wounds and VAC dressing Wound type: Stage 4 to right ischial with negative pressure dressing present on admission Stage 4 to left ischial with negative pressure dressing present on admission Stage 4 to sacrum Left lateral leg wound with unknown etiology Right lateral leg wound with unknown etiology Pressure Ulcer POA:  Stage 4 to right ischial: yes Stage 4 to left ischial:yes Stage 4 to sacrum:yes Left lateral leg wound:unknown etiology Right lateral leg wound: unknown etiology Measurement: Stage 4 to right ischial:  6cmx5.3cmx2.5cm with undermining from 1-6 o'clock that is 5.5cm and has a 5.5cm tunnel at 12 o'clock Stage 4 to left ischial:4.5cm x 11cmx4.5 cm.  Stage 4 to sacrum:9cmx4cmx3cm with undermining from 9-4 o'clock that is 5cm and a 4.3cm tunnel at 10 o'clock  Left lateral leg wound : 8cmx0.7cmx0.1cm  Right lateral leg wound: 8cmx 0.5cmx0.1cm Wound bed: Stage 4 to right ischial: pink non granular tissue Stage 4 to left ischial: 25% yellow slough, 75% pink non granular tissue Stage 4 to sacrum: pink non granular tissue Left lateral leg wound: 75% yellow slough, 25% pink non granular tissue Right lateral leg wound:100% scabbed tissue  Drainage  Stage 4 to right ischial: moderate sanguinous, no odor Stage 4 to left ischial:moderate sanguinous, no odor Stage 4 to sacrum : moderate sanguinous, no odor Left lateral leg wound: scant serous sanguinous, no odor Right lateral leg wound: scant serous sanguinous, no odor  Periwound: Stage 4 to right ischial: intact Stage 4 to left ischial: intact Stage 4 to sacrum: intact Left lateral leg wound:intact Right lateral leg wound:intact   Dressing procedure/placement/frequency: Stage 4 to right ischial & Stage 4 to sacrum: pack tunneled and undermined areas with white foam 1pc white foam used in the right ishcium, 1 pc used in the undermined area of the sacrum. Skin protected  between two wounds, and 1pc of drape used to bridge to the right hip. 1pc of black foam used to fill the remainder of the sacrum and 1pc used to fill the remainder of the right ischium.  All areas sealed with drape and seal obtained at 125mmHG. Patient tolerated well. change M/W/F. Will change today and next change will occur on friday Stage 4 to left ischial: enzymatic debridement to yellow slough, pack with saline moistened loose gauze, and cover with ABD pad, secure with tape, change daily Left lateral leg wound: clean with saline and cover with bordered foam dressing. Change every 3 days Right lateral leg wound with unknown etiology:  clean with saline and cover with bordered foam dressing. Change every 3 days  Patient also has a RLQ ostomy that has a 2 3/4 inch wafer that is intact. Moderate amount of brown soft stool. Bedside nurse to change wafer twice week.   Durel SaltsKeatah Brooks FNP-C, [employer] [employer] student   Discussed POC with patient and bedside nurse.  Will follow for NPWT changes  Thanks  Zhavia Cunanan M.D.[employer] MSN, RN,CWOCN, CNS [phone])
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
## 2016-03-12 NOTE — Progress Notes (Addendum)
Pharmacy Antibiotic Note [name] is a 50 y.o. male admitted on 03/12/2016 with sepsis.  Pharmacy has been consulted for Zosyn and vancomycin dosing. Received Zosyn 3.375 grams IV x 1 dose at OSH per report.  Plan: Vancomycin 1000 mg IV x 1 now Will follow up on BMP and schedule further doses of Zosyn and vancomycin  Height: 6' (182.9 cm) Weight: 140 lb 8 oz (63.7 kg) (nurse is aware.) IBW/kg (Calculated) : 77.6  Temp (24hrs), Avg:98.4 F (36.9 C), Min:98.4 F (36.9 C), Max:98.4 F (36.9 C)  No Known Allergies  Antimicrobials this admission: 11/21 Zosyn >>  11/21 vancomycin >>   Dose adjustments this admission: n/a  Microbiology results: 11/21 BCx:  11/21 UCx:     Thank you for allowing pharmacy to be a part of this patient's care.  Pollyann SamplesAndy Johnston, PharmD, BCPS 03/12/2016, 5:30 AM Pager: [phone]    =====================================   Addendum: - SCr 0.5, CrCL 100 ml/min - received vanc 1gm IV around 0700 today   Plan: - Vanc 750mg  IV Q8H  - Continue Zosyn 3.375gm IV Q8H, 4 hr infusion - Monitor renal fxn, clinica progress, vanc trough at Css given paraplegia    [name], PharmD, BCPS Pager:  [phone] 03/12/2016, 8:54 AM
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
## 2016-03-12 NOTE — Progress Notes (Signed)
Patient lying in bed, no needs at this time. Call light within reach. 

## 2016-03-12 NOTE — Care Management Note (Signed)
Case Management Note Donn PieriniKristi Tasneem Cormier RN, BSN Unit 2W-Case Manager [phone]  Patient Details  Name: William NordmannDonald Wayne Logan MRN: [id number] Date of Birth: 1965-11-24  Subjective/Objective:  Pt admitted with sepsis                Action/Plan: PTA pt lived at home with wife- spoke with wife at bedside- per conversation pt has hospital bed, w/c, lift, and shower chair at home- [facility]- call made to agency to confirm services- spoke with Lupita Leashonna- pt was active with HHRN/PT/OT/aide/social work- [facility] for [facility]- (fax # [facility] is [phone]) per wife pt also has [employer] wound vac - with HHRN drg changes every other day- pt was going to wound care center on Wednesdays. CM will continue to follow for d/c needs   Expected Discharge Date:                  Expected Discharge Plan:  Home w Home Health Services  In-House Referral:     Discharge planning Services  CM Consult  Post Acute Care Choice:  Home Health, Resumption of Svcs/PTA Provider Choice offered to:  Spouse  DME Arranged:    DME Agency:     HH Arranged:  RN, PT, Nurse's Aide, OT, Social Work [employer] Agency:  [facility]  Status of Service:  In process, will continue to follow  If discussed at Long Length of Stay Meetings, dates discussed:    Additional Comments:  Darrold SpanWebster, Sharifa Bucholz Hall, RN 03/12/2016, 2:48 PM

## 2016-03-12 NOTE — Progress Notes (Signed)
Dr. Obie DredgeKakraanady here and aware of patient arrival and V.s.

## 2016-03-13 ENCOUNTER — Inpatient Hospital Stay (HOSPITAL_COMMUNITY): Payer: BLUE CROSS/BLUE SHIELD

## 2016-03-13 LAB — CBC
HEMATOCRIT: 26.9 % — AB (ref 39.0–52.0)
Hemoglobin: 7.6 g/dL — ABNORMAL LOW (ref 13.0–17.0)
MCH: 23.2 pg — ABNORMAL LOW (ref 26.0–34.0)
MCHC: 28.3 g/dL — AB (ref 30.0–36.0)
MCV: 82 fL (ref 78.0–100.0)
PLATELETS: 490 10*3/uL — AB (ref 150–400)
RBC: 3.28 MIL/uL — ABNORMAL LOW (ref 4.22–5.81)
RDW: 17.8 % — AB (ref 11.5–15.5)
WBC: 9.5 10*3/uL (ref 4.0–10.5)

## 2016-03-13 LAB — BASIC METABOLIC PANEL
Anion gap: 7 (ref 5–15)
CO2: 26 mmol/L (ref 22–32)
CREATININE: 0.42 mg/dL — AB (ref 0.61–1.24)
Calcium: 9.1 mg/dL (ref 8.9–10.3)
Chloride: 105 mmol/L (ref 101–111)
GFR calc Af Amer: >60 mL/min (ref >60)
GLUCOSE: 83 mg/dL (ref 65–99)
POTASSIUM: 3.5 mmol/L (ref 3.5–5.1)
SODIUM: 138 mmol/L (ref 135–145)

## 2016-03-13 MED ORDER — MIDAZOLAM HCL 2 MG/2ML IJ SOLN
INTRAMUSCULAR | Status: AC | PRN
  Administered 2016-03-13 (×2): 0.5 mg via INTRAVENOUS

## 2016-03-13 MED ORDER — METOPROLOL TARTRATE 25 MG PO TABS
25.0000 mg | ORAL_TABLET | Freq: Three times a day (TID) | ORAL | Status: DC
  Administered 2016-03-13 – 2016-03-21 (×25): 25 mg via ORAL
  Filled 2016-03-13 (×25): qty 1

## 2016-03-13 MED ORDER — MIDAZOLAM HCL 2 MG/2ML IJ SOLN
INTRAMUSCULAR | Status: AC
  Filled 2016-03-13: qty 2

## 2016-03-13 MED ORDER — FENTANYL CITRATE (PF) 100 MCG/2ML IJ SOLN
INTRAMUSCULAR | Status: AC
  Filled 2016-03-13: qty 2

## 2016-03-13 MED ORDER — LIDOCAINE HCL 1 % IJ SOLN
INTRAMUSCULAR | Status: AC
  Filled 2016-03-13: qty 20

## 2016-03-13 MED ORDER — FENTANYL CITRATE (PF) 100 MCG/2ML IJ SOLN
INTRAMUSCULAR | Status: AC | PRN
  Administered 2016-03-13 (×2): 25 ug via INTRAVENOUS

## 2016-03-13 MED ORDER — HEPARIN SODIUM (PORCINE) 5000 UNIT/ML IJ SOLN
5000.0000 [IU] | Freq: Three times a day (TID) | INTRAMUSCULAR | Status: DC
  Administered 2016-03-13 – 2016-03-26 (×37): 5000 [IU] via SUBCUTANEOUS
  Filled 2016-03-13 (×34): qty 1

## 2016-03-13 NOTE — Sedation Documentation (Signed)
Patient is resting comfortably. 
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
## 2016-03-13 NOTE — Progress Notes (Signed)
Patient lying in bed, call light within reach

## 2016-03-13 NOTE — Procedures (Signed)
Interventional Radiology Procedure Note  Procedure: Placement of a 67F drain into RIGHT pleural empyema.  200 mL thick purulent material aspirated.  Cavity lavaged and then tube hooked to wall suction at -20 cm H20.  Complications: None  Estimated Blood Loss: None  Recommendations: - Follow cultures and output - May need tPA instillations to break up loculations  Signed,  Sterling BigHeath K. Patrice Matthew, MD

## 2016-03-14 ENCOUNTER — Inpatient Hospital Stay (HOSPITAL_COMMUNITY): Payer: BLUE CROSS/BLUE SHIELD

## 2016-03-14 LAB — BASIC METABOLIC PANEL
Anion gap: 8 (ref 5–15)
CALCIUM: 9 mg/dL (ref 8.9–10.3)
CHLORIDE: 103 mmol/L (ref 101–111)
CO2: 25 mmol/L (ref 22–32)
CREATININE: 0.45 mg/dL — AB (ref 0.61–1.24)
GFR calc non Af Amer: >60 mL/min (ref >60)
Glucose, Bld: 91 mg/dL (ref 65–99)
Potassium: 3.4 mmol/L — ABNORMAL LOW (ref 3.5–5.1)
Sodium: 136 mmol/L (ref 135–145)

## 2016-03-14 LAB — VANCOMYCIN, TROUGH
VANCOMYCIN TR: 16 ug/mL (ref 15–20)
Vancomycin Tr: 23 ug/mL (ref 15–20)

## 2016-03-14 LAB — CBC
HEMATOCRIT: 28 % — AB (ref 39.0–52.0)
Hemoglobin: 8 g/dL — ABNORMAL LOW (ref 13.0–17.0)
MCH: 22.9 pg — AB (ref 26.0–34.0)
MCHC: 28.6 g/dL — AB (ref 30.0–36.0)
MCV: 80.2 fL (ref 78.0–100.0)
Platelets: 512 10*3/uL — ABNORMAL HIGH (ref 150–400)
RBC: 3.49 MIL/uL — ABNORMAL LOW (ref 4.22–5.81)
RDW: 17.3 % — AB (ref 11.5–15.5)
WBC: 11.8 10*3/uL — ABNORMAL HIGH (ref 4.0–10.5)

## 2016-03-14 MED ORDER — POTASSIUM CHLORIDE CRYS ER 20 MEQ PO TBCR
40.0000 meq | EXTENDED_RELEASE_TABLET | Freq: Once | ORAL | Status: AC
  Administered 2016-03-14: 40 meq via ORAL
  Filled 2016-03-14: qty 2

## 2016-03-14 NOTE — Progress Notes (Signed)
Received critical value for Vancomycin trough of 23. Per pharmacy, the lab was drawn too early, and is ok to give as scheduled.  Did not contact physician as results were based on an incorrect draw. Patient's colostomy bag leaking at velcro seal. Patient bathed, clean linens and also gave morphine due to pain of being turned along with new chest tube. Will continue to monitor.
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
## 2016-03-14 NOTE — Progress Notes (Signed)
Pharmacy Antibiotic Note  [name] is a 50 y.o. male admitted on 03/12/2016 with sepsis.  Pharmacy has been consulted for Vancomycin dosing. Vancomycin trough of 23 this AM was drawn several hours early. Given paraplegia and difficulty assessing renal function, will aim for a correctly time level later today  Plan: -Continue vancomycin as ordered for now -Re-time another vancomycin trough for 1330   Recent Labs Lab 03/12/16 0545 03/12/16 0549 03/12/16 0921 03/13/16 0413 03/14/16 0237  WBC 12.9*  --   --  9.5 11.8*  CREATININE 0.50*  --   --  0.42* 0.45*  LATICACIDVEN  --  1.2 0.8  --   --   VANCOTROUGH  --   --   --   --  23*    Estimated Creatinine Clearance: 106.3 mL/min (by C-G formula based on SCr of 0.45 mg/dL (L)).    No Known Allergies   [name], [name] 03/14/2016 4:02 AM

## 2016-03-14 NOTE — Progress Notes (Signed)
Referring Physician(s): Dr. Donata ClayVan Trigt  Supervising Physician: Ruel FavorsShick, Trevor  Patient Status:  [facility] - [facility]  Chief Complaint: (R)empyema  Subjective: S/p (R)chest tube placement yesterday. Feeling some better today Sitting up in bed.  Allergies: Patient has no known allergies.  Medications:  Current Facility-Administered Medications:  .  acetaminophen (TYLENOL) tablet 650 mg, 650 mg, Oral, Q6H PRN **OR** acetaminophen (TYLENOL) suppository 650 mg, 650 mg, Rectal, Q6H PRN, Eduard ClosArshad N Kakrakandy, MD .  collagenase (SANTYL) ointment, , Topical, Daily, KelloggJennifer Chahn-Yang Choi, DO .  feeding supplement (ENSURE ENLIVE) (ENSURE ENLIVE) liquid 237 mL, 237 mL, Oral, BID BM, Jennifer Chahn-Yang Choi, DO, 237 mL at 03/14/16 0950 .  feeding supplement (PRO-STAT SUGAR FREE 64) liquid 30 mL, 30 mL, Oral, Daily, KelloggJennifer Chahn-Yang Choi, DO, 30 mL at 03/14/16 0950 .  heparin injection 5,000 Units, 5,000 Units, Subcutaneous, Q8H, Belkys A Regalado, MD, 5,000 Units at 03/14/16 0538 .  ipratropium-albuterol (DUONEB) 0.5-2.5 (3) MG/3ML nebulizer solution 3 mL, 3 mL, Nebulization, Q6H PRN, Eduard ClosArshad N Kakrakandy, MD .  metoprolol tartrate (LOPRESSOR) tablet 25 mg, 25 mg, Oral, Q8H, Belkys A Regalado, MD, 25 mg at 03/14/16 0536 .  morphine 2 MG/ML injection 2 mg, 2 mg, Intravenous, Q3H PRN, Eduard ClosArshad N Kakrakandy, MD, 2 mg at 03/14/16 0529 .  multivitamin with minerals tablet 1 tablet, 1 tablet, Oral, Daily, Jordan HawksJennifer Chahn-Yang Choi, DO, 1 tablet at 03/14/16 0949 .  ondansetron (ZOFRAN) tablet 4 mg, 4 mg, Oral, Q6H PRN **OR** ondansetron (ZOFRAN) injection 4 mg, 4 mg, Intravenous, Q6H PRN, Eduard ClosArshad N Kakrakandy, MD .  oxyCODONE (Oxy IR/ROXICODONE) immediate release tablet 10 mg, 10 mg, Oral, Q4H PRN, Jordan HawksJennifer Chahn-Yang Choi, DO, 10 mg at 03/14/16 0950 .  piperacillin-tazobactam (ZOSYN) IVPB 3.375 g, 3.375 g, Intravenous, Q8H, Eduard ClosArshad N Kakrakandy, MD, 3.375 g at 03/14/16 0532 .  vancomycin (VANCOCIN) IVPB 750  mg/150 ml premix, 750 mg, Intravenous, Q8H, Thuy D Dang, RPH, 750 mg at 03/14/16 0532    Vital Signs: BP 114/76 (BP Location: Left Arm)   Pulse (!) 104   Temp 98.3 F (36.8 C) (Oral)   Resp 18   Ht 6' (1.829 m)   Wt 149 lb (67.6 kg)   SpO2 96%   BMI 20.21 kg/m   Physical Exam  Constitutional: He appears well-developed. No distress.  Cardiovascular: Normal rate, regular rhythm and normal heart sounds.   Pulmonary/Chest: Effort normal. No respiratory distress.  Still diminished BS at right base Chest tube intact, site clean, NT Output about 200mL, less purulent, more serosanguinous  Neurological: He is alert.    Imaging: Dg Chest Port 1 View  Result Date: 03/14/2016 CLINICAL DATA:  Empyema.  Chest tubes in place. EXAM: PORTABLE CHEST 1 VIEW COMPARISON:  CT images for chest tube placement yesterday. One-view chest x-ray 03/12/2016. FINDINGS: A right-sided chest tube is in place. A right-sided pleural effusion appears be more layering on today's exam. There is persistent asymmetric airspace opacity. The left lung is clear. Thoracolumbar fusion is noted. Posterior right-sided rib fractures are again seen. IMPRESSION: 1. Persistent right pleural effusion following chest tube placement. 2. Right-sided rib fractures. 3. Right lung opacification is slightly improved. Electronically Signed   By: Marin Robertshristopher  Mattern M.D.   On: 03/14/2016 10:04   Dg Chest Port 1 View  Result Date: 03/12/2016 CLINICAL DATA:  Sepsis and tachycardia. EXAM: PORTABLE CHEST 1 VIEW COMPARISON:  CT chest 03/11/2016.  Chest radiograph 03/11/2016. FINDINGS: Normal heart size and pulmonary vascularity. Opacity in the right chest  corresponding to abscess and consolidation seen on previous CT. No progression since the previous chest radiograph. Left lung is clear. No pneumothorax. Normal heart size and pulmonary vascularity. Old left rib fracture deformities. IMPRESSION: No change since previous study. Opacities in the  right chest corresponding to CT finding of abscess and consolidation in the right lung. Electronically Signed   By: Burman NievesWilliam  Stevens M.D.   On: 03/12/2016 06:27   Ct Image Guided Fluid Drain By Catheter  Result Date: 03/13/2016 INDICATION: 50 year old male with loculated right-sided pleural effusion highly concerning for empyema EXAM: CT IMAGE GUIDED FLUID DRAIN BY CATHETER MEDICATIONS: The patient is currently admitted to the hospital and receiving intravenous antibiotics. The antibiotics were administered within an appropriate time frame prior to the initiation of the procedure. ANESTHESIA/SEDATION: Fentanyl 50 mcg IV; Versed 1 mg IV Moderate Sedation Time:  15 minutes The patient was continuously monitored during the procedure by the interventional radiology nurse under my direct supervision. COMPLICATIONS: None immediate. PROCEDURE: Informed written consent was obtained from the patient after a thorough discussion of the procedural risks, benefits and alternatives. All questions were addressed. A timeout was performed prior to the initiation of the procedure. The patient was placed right lateral decubitus on the CT gantry. An initial planning axial CT scan was performed. The low-attenuation loculated pleural effusion the right pleural space was successfully identified. A suitable skin entry site was selected and marked. The region was sterilely prepped and draped in standard fashion using chlorhexidine skin prep. Local anesthesia was attained by infiltration with 1% lidocaine. A small dermatotomy was made. Under intermittent CT guidance an 18 gauge trocar needle was advanced into the fluid. The inner stylet was removed and a 0.035 Amplatz wire was coiled within the fluid collection. The skin tract was then dilated and a 20 [place] Thal percutaneous drain was advanced over the wire and formed in the pleural space. Aspiration yields extremely thick purulent tan fluid. A sample was sent for Gram stain and  culture. The catheter was secured to the skin with 0 Prolene suture. The empyema cavity was irrigated with sterile saline and then connected to low wall suction via a pleura vac at -20 cm H2O. Sterile bandages were applied. The patient tolerated the procedure well. IMPRESSION: Successful placement of a 20 French percutaneous drain in the right pleural empyema. Aspiration yields at least 200 mL thick purulent fluid. Samples were sent for Gram stain and culture. Signed, Sterling BigHeath K. McCullough, MD [facility] Electronically Signed   By: Malachy MoanHeath  McCullough M.D.   On: 03/13/2016 17:04    Labs:  CBC:  Recent Labs  09/16/15 0830 03/12/16 0545 03/13/16 0413 03/14/16 0237  WBC 9.8 12.9* 9.5 11.8*  HGB 11.9* 8.8* 7.6* 8.0*  HCT 38.5* 30.7* 26.9* 28.0*  PLT 242 534* 490* 512*    COAGS:  Recent Labs  07/30/15 0145 07/30/15 0524 07/30/15 0735 03/12/16 0545  INR 1.09 1.31 1.28 1.16  APTT  --  27 26 33    BMP:  Recent Labs  09/16/15 0830 03/12/16 0545 03/13/16 0413 03/14/16 0237  NA 139 136 138 136  K 3.6 3.7 3.5 3.4*  CL 103 100* 105 103  CO2 28 26 26 25   GLUCOSE 116* 100* 83 91  BUN 23* 12 <5* <5*  CALCIUM 9.0 9.4 9.1 9.0  CREATININE 0.52* 0.50* 0.42* 0.45*  GFRNONAA >60 >60 >60 >60  GFRAA >60 >60 >60 >60    LIVER FUNCTION TESTS:  Recent  Labs  07/30/15 0735 08/07/15 0415 08/22/15 0346 03/12/16 0545  BILITOT 1.6* 2.0* 0.9 0.8  AST 213* 52* 37 18  ALT 111* 61 60 22  ALKPHOS 40 45 115 76  PROT 4.9* 4.7* 5.8* 8.2*  ALBUMIN 2.7* 1.7* 1.7* 2.1*    Assessment and Plan: (R)empyema s/p perc chest tube 11/22 Continue to monitor output and CXR   Electronically Signed: Brayton El 03/14/2016, 10:54 AM   I spent a total of 15 Minutes at the the patient's bedside AND on the patient's hospital floor or unit, greater than 50% of which was counseling/coordinating care for right chest drain for  empyema
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
## 2016-03-15 DIAGNOSIS — J869 Pyothorax without fistula: Secondary | ICD-10-CM

## 2016-03-15 LAB — BASIC METABOLIC PANEL
Anion gap: 7 (ref 5–15)
CALCIUM: 9.4 mg/dL (ref 8.9–10.3)
CO2: 28 mmol/L (ref 22–32)
CREATININE: 0.47 mg/dL — AB (ref 0.61–1.24)
Chloride: 104 mmol/L (ref 101–111)
GFR calc Af Amer: >60 mL/min (ref >60)
GLUCOSE: 102 mg/dL — AB (ref 65–99)
POTASSIUM: 3.6 mmol/L (ref 3.5–5.1)
SODIUM: 139 mmol/L (ref 135–145)

## 2016-03-15 LAB — CBC
HCT: 26.9 % — ABNORMAL LOW (ref 39.0–52.0)
Hemoglobin: 7.8 g/dL — ABNORMAL LOW (ref 13.0–17.0)
MCH: 23.6 pg — AB (ref 26.0–34.0)
MCHC: 29 g/dL — AB (ref 30.0–36.0)
MCV: 81.3 fL (ref 78.0–100.0)
PLATELETS: 497 10*3/uL — AB (ref 150–400)
RBC: 3.31 MIL/uL — AB (ref 4.22–5.81)
RDW: 17.2 % — AB (ref 11.5–15.5)
WBC: 7.9 10*3/uL (ref 4.0–10.5)
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
## 2016-03-15 NOTE — Progress Notes (Signed)
Referring Physician(s): Dr. Donata Clay  Supervising Physician: Ruel Favors  Patient Status:  [facility] - In-pt  Chief Complaint: (R)empyema  Subjective: S/p (R)chest tube placement yesterday. Feeling some better today   Allergies: Patient has no known allergies.  Medications:  Current Facility-Administered Medications:  .  acetaminophen (TYLENOL) tablet 650 mg, 650 mg, Oral, Q6H PRN **OR** acetaminophen (TYLENOL) suppository 650 mg, 650 mg, Rectal, Q6H PRN, Eduard Clos, MD .  collagenase (SANTYL) ointment, , Topical, Daily, Kellogg, DO .  feeding supplement (ENSURE ENLIVE) (ENSURE ENLIVE) liquid 237 mL, 237 mL, Oral, BID BM, Jennifer Chahn-Yang Choi, DO, 237 mL at 03/14/16 0950 .  feeding supplement (PRO-STAT SUGAR FREE 64) liquid 30 mL, 30 mL, Oral, Daily, Kellogg, DO, 30 mL at 03/14/16 0950 .  heparin injection 5,000 Units, 5,000 Units, Subcutaneous, Q8H, Belkys A Regalado, MD, 5,000 Units at 03/15/16 1610 .  ipratropium-albuterol (DUONEB) 0.5-2.5 (3) MG/3ML nebulizer solution 3 mL, 3 mL, Nebulization, Q6H PRN, Eduard Clos, MD .  metoprolol tartrate (LOPRESSOR) tablet 25 mg, 25 mg, Oral, Q8H, Belkys A Regalado, MD, 25 mg at 03/15/16 9604 .  morphine 2 MG/ML injection 2 mg, 2 mg, Intravenous, Q3H PRN, Eduard Clos, MD, 2 mg at 03/15/16 0110 .  multivitamin with minerals tablet 1 tablet, 1 tablet, Oral, Daily, Jordan Hawks, DO, 1 tablet at 03/14/16 0949 .  ondansetron (ZOFRAN) tablet 4 mg, 4 mg, Oral, Q6H PRN **OR** ondansetron (ZOFRAN) injection 4 mg, 4 mg, Intravenous, Q6H PRN, Eduard Clos, MD .  oxyCODONE (Oxy IR/ROXICODONE) immediate release tablet 10 mg, 10 mg, Oral, Q4H PRN, Jordan Hawks, DO, 10 mg at 03/14/16 0950 .  piperacillin-tazobactam (ZOSYN) IVPB 3.375 g, 3.375 g, Intravenous, Q8H, Eduard Clos, MD, 3.375 g at 03/15/16 0711 .  vancomycin (VANCOCIN) IVPB 750 mg/150 ml premix,  750 mg, Intravenous, Q8H, Gerilyn Nestle, RPH, 750 mg at 03/15/16 0711    Vital Signs: BP 112/78   Pulse 88   Temp 98.1 F (36.7 C) (Oral)   Resp 18   Ht 6' (1.829 m)   Wt 136 lb (61.7 kg)   SpO2 97%   BMI 18.44 kg/m   Physical Exam  Constitutional: He appears well-developed. No distress.  Cardiovascular: Normal rate, regular rhythm and normal heart sounds.   Pulmonary/Chest: Effort normal. No respiratory distress.  Still diminished BS at right base Chest tube intact, site clean, NT Output  Serosanguinous, about 40 mL recorded  Neurological: He is alert.    Imaging: Dg Chest Port 1 View  Result Date: 03/14/2016 CLINICAL DATA:  Empyema.  Chest tubes in place. EXAM: PORTABLE CHEST 1 VIEW COMPARISON:  CT images for chest tube placement yesterday. One-view chest x-ray 03/12/2016. FINDINGS: A right-sided chest tube is in place. A right-sided pleural effusion appears be more layering on today's exam. There is persistent asymmetric airspace opacity. The left lung is clear. Thoracolumbar fusion is noted. Posterior right-sided rib fractures are again seen. IMPRESSION: 1. Persistent right pleural effusion following chest tube placement. 2. Right-sided rib fractures. 3. Right lung opacification is slightly improved. Electronically Signed   By: Marin Roberts M.D.   On: 03/14/2016 10:04   Dg Chest Port 1 View  Result Date: 03/12/2016 CLINICAL DATA:  Sepsis and tachycardia. EXAM: PORTABLE CHEST 1 VIEW COMPARISON:  CT chest 03/11/2016.  Chest radiograph 03/11/2016. FINDINGS: Normal heart size and pulmonary vascularity. Opacity in the right chest corresponding to abscess and consolidation seen on previous  CT. No progression since the previous chest radiograph. Left lung is clear. No pneumothorax. Normal heart size and pulmonary vascularity. Old left rib fracture deformities. IMPRESSION: No change since previous study. Opacities in the right chest corresponding to CT finding of abscess and  consolidation in the right lung. Electronically Signed   By: Burman NievesWilliam  Stevens M.D.   On: 03/12/2016 06:27   Ct Image Guided Fluid Drain By Catheter  Result Date: 03/13/2016 INDICATION: 50 year old male with loculated right-sided pleural effusion highly concerning for empyema EXAM: CT IMAGE GUIDED FLUID DRAIN BY CATHETER MEDICATIONS: The patient is currently admitted to the hospital and receiving intravenous antibiotics. The antibiotics were administered within an appropriate time frame prior to the initiation of the procedure. ANESTHESIA/SEDATION: Fentanyl 50 mcg IV; Versed 1 mg IV Moderate Sedation Time:  15 minutes The patient was continuously monitored during the procedure by the interventional radiology nurse under my direct supervision. COMPLICATIONS: None immediate. PROCEDURE: Informed written consent was obtained from the patient after a thorough discussion of the procedural risks, benefits and alternatives. All questions were addressed. A timeout was performed prior to the initiation of the procedure. The patient was placed right lateral decubitus on the CT gantry. An initial planning axial CT scan was performed. The low-attenuation loculated pleural effusion the right pleural space was successfully identified. A suitable skin entry site was selected and marked. The region was sterilely prepped and draped in standard fashion using chlorhexidine skin prep. Local anesthesia was attained by infiltration with 1% lidocaine. A small dermatotomy was made. Under intermittent CT guidance an 18 gauge trocar needle was advanced into the fluid. The inner stylet was removed and a 0.035 Amplatz wire was coiled within the fluid collection. The skin tract was then dilated and a 20 [place] Thal percutaneous drain was advanced over the wire and formed in the pleural space. Aspiration yields extremely thick purulent tan fluid. A sample was sent for Gram stain and culture. The catheter was secured to the skin with 0 Prolene  suture. The empyema cavity was irrigated with sterile saline and then connected to low wall suction via a pleura vac at -20 cm H2O. Sterile bandages were applied. The patient tolerated the procedure well. IMPRESSION: Successful placement of a 20 French percutaneous drain in the right pleural empyema. Aspiration yields at least 200 mL thick purulent fluid. Samples were sent for Gram stain and culture. Signed, Sterling BigHeath K. McCullough, MD [facility] New Electronically Signed   By: Malachy MoanHeath  McCullough M.D.   On: 03/13/2016 17:04    Labs:  CBC:  Recent Labs  03/12/16 0545 03/13/16 0413 03/14/16 0237 03/15/16 0738  WBC 12.9* 9.5 11.8* 7.9  HGB 8.8* 7.6* 8.0* 7.8*  HCT 30.7* 26.9* 28.0* 26.9*  PLT 534* 490* 512* 497*    COAGS:  Recent Labs  07/30/15 0145 07/30/15 0524 07/30/15 0735 03/12/16 0545  INR 1.09 1.31 1.28 1.16  APTT  --  27 26 33    BMP:  Recent Labs  03/12/16 0545 03/13/16 0413 03/14/16 0237 03/15/16 0738  NA 136 138 136 139  K 3.7 3.5 3.4* 3.6  CL 100* 105 103 104  CO2 26 26 25 28   GLUCOSE 100* 83 91 102*  BUN 12 <5* <5* <5*  CALCIUM 9.4 9.1 9.0 9.4  CREATININE 0.50* 0.42* 0.45* 0.47*  GFRNONAA >60 >60 >60 >60  GFRAA >60 >60 >60 >60    LIVER FUNCTION TESTS:  Recent Labs  07/30/15 0735 08/07/15 0415 08/22/15 0346  03/12/16 0545  BILITOT 1.6* 2.0* 0.9 0.8  AST 213* 52* 37 18  ALT 111* 61 60 22  ALKPHOS 40 45 115 76  PROT 4.9* 4.7* 5.8* 8.2*  ALBUMIN 2.7* 1.7* 1.7* 2.1*    Assessment and Plan: (R)empyema s/p perc chest tube 11/22 Cx with GPC and GNR Continue to monitor output and CXR   Electronically Signed: Brayton ElBRUNING, Willena Jeancharles 03/15/2016, 10:22 AM   I spent a total of 15 Minutes at the the patient's bedside AND on the patient's hospital floor or unit, greater than 50% of which was counseling/coordinating care for right chest drain for empyema

## 2016-03-15 NOTE — Progress Notes (Signed)
Patient states he feels better than he did at the beginning of the shift. Could not tell me where he felt bad earlier. Will continue to monitor, call light within reach

## 2016-03-15 NOTE — Progress Notes (Signed)
  Subjective: R chest tube by IR for lung abscess, empyema Culture shows gram + clusters, few gram - rods Cont  vanc, Zosyn Not candidate for VATS Will need 14 days of IV antibiotics Objective: Vital signs in last 24 hours: Temp:  [98.1 F (36.7 C)-98.4 F (36.9 C)] 98.1 F (36.7 C) (11/24 0508) Pulse Rate:  [88-101] 88 (11/24 0711) Cardiac Rhythm: Normal sinus rhythm (11/24 0700) Resp:  [18-19] 18 (11/24 0508) BP: (107-114)/(60-78) 112/78 (11/24 0711) SpO2:  [96 %-100 %] 97 % (11/24 0508) Weight:  [136 lb (61.7 kg)] 136 lb (61.7 kg) (11/24 0508)  Hemodynamic parameters for last 24 hours:  stable  Intake/Output from previous day: 11/23 0701 - 11/24 0700 In: -  Out: 1980 [Urine:1800; Chest Tube:180] Intake/Output this shift: Total I/O In: -  Out: 491 [Urine:450; Stool:1; Chest Tube:40]    Lab Results:  Recent Labs  03/14/16 0237 03/15/16 0738  WBC 11.8* 7.9  HGB 8.0* 7.8*  HCT 28.0* 26.9*  PLT 512* 497*   BMET:  Recent Labs  03/14/16 0237 03/15/16 0738  NA 136 139  K 3.4* 3.6  CL 103 104  CO2 25 28  GLUCOSE 91 102*  BUN <5* <5*  CREATININE 0.45* 0.47*  CALCIUM 9.0 9.4    PT/INR: No results for input(s): LABPROT, INR in the last 72 hours. ABG    Component Value Date/Time   PHART 7.458 (H) 08/12/2015 0915   HCO3 28.8 (H) 08/12/2015 0915   TCO2 30.0 08/12/2015 0915   ACIDBASEDEF 4.0 (H) 07/31/2015 1826   O2SAT 95.9 08/12/2015 0915   CBG (last 3)  No results for input(s): GLUCAP in the last 72 hours.  Assessment/Plan: S/P  Cont current care   LOS: 3 days    Kathlee Nationseter Van Trigt III 03/15/2016

## 2016-03-15 NOTE — Consult Note (Addendum)
WOC follow-up:  Vac dressing change to Stage 4 to right ischial & Stage 4 to sacrum: packed tunneled and undermined areas with white foam; 1pc white foam used in the right ishcium, 1 pc used in the undermined area of the sacrum. Skin protected between two wounds, and drape used to bridge to the right hip. 1pc of black foam used to fill the remainder of the sacrum and 1pc used to fill the remainder of the right ischium. Applied barrier ring near rectum to maintain a seal.  Rectum had a formed stool fall out, despite the fact that patient has had an ostomy for several months. All areas sealed with drape and seal obtained at 125mmHG. Track pad bridged to hip to reduce pressure. Patient tolerated well without discomfort. WOC team will plan for dressing change on Monday. Cammie Mcgeeawn Shonette Rhames MSN, RN, CWOCN, [place]CN-AP, CNS [phone]

## 2016-03-16 LAB — BASIC METABOLIC PANEL
ANION GAP: 7 (ref 5–15)
BUN: 5 mg/dL — ABNORMAL LOW (ref 6–20)
CALCIUM: 9.2 mg/dL (ref 8.9–10.3)
CHLORIDE: 102 mmol/L (ref 101–111)
CO2: 29 mmol/L (ref 22–32)
Creatinine, Ser: 0.56 mg/dL — ABNORMAL LOW (ref 0.61–1.24)
GFR calc non Af Amer: >60 mL/min (ref >60)
Glucose, Bld: 92 mg/dL (ref 65–99)
Potassium: 3.6 mmol/L (ref 3.5–5.1)
SODIUM: 138 mmol/L (ref 135–145)

## 2016-03-16 LAB — VANCOMYCIN, TROUGH: VANCOMYCIN TR: 25 ug/mL — AB (ref 15–20)

## 2016-03-16 LAB — PREPARE RBC (CROSSMATCH)

## 2016-03-16 LAB — CBC
HEMATOCRIT: 27.2 % — AB (ref 39.0–52.0)
HEMOGLOBIN: 7.7 g/dL — AB (ref 13.0–17.0)
MCH: 23.1 pg — ABNORMAL LOW (ref 26.0–34.0)
MCHC: 28.3 g/dL — ABNORMAL LOW (ref 30.0–36.0)
MCV: 81.4 fL (ref 78.0–100.0)
Platelets: 535 10*3/uL — ABNORMAL HIGH (ref 150–400)
RBC: 3.34 MIL/uL — ABNORMAL LOW (ref 4.22–5.81)
RDW: 17.2 % — ABNORMAL HIGH (ref 11.5–15.5)
WBC: 8.5 10*3/uL (ref 4.0–10.5)

## 2016-03-16 MED ORDER — VANCOMYCIN HCL 500 MG IV SOLR
500.0000 mg | Freq: Three times a day (TID) | INTRAVENOUS | Status: DC
  Administered 2016-03-16 – 2016-03-18 (×6): 500 mg via INTRAVENOUS
  Filled 2016-03-16 (×9): qty 500

## 2016-03-16 MED ORDER — SODIUM CHLORIDE 0.9 % IV SOLN: Freq: Once | INTRAVENOUS | Status: DC
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
## 2016-03-16 NOTE — Progress Notes (Signed)
Patient ID: [name], male   DOB: May 04, 1965, 50 y.o.   MRN: [id number]    Referring Physician(s): Dr. Kathlee Nations Trigt  Supervising Physician: Malachy Moan  Patient Status: [facility] - In-pt  Chief Complaint: (R) empyema  Subjective: Pt sleeping.  Awakens but doesn't talk.  Allergies: Patient has no known allergies.  Medications: Prior to Admission medications   Medication Sig Start Date End Date Taking? Authorizing Provider  collagenase (SANTYL) ointment Apply topically daily. To sacral wound 09/19/15  Yes Megan N Baird, PA-C  diltiazem (CARDIZEM) 60 MG tablet Take 60 mg by mouth daily.   Yes Historical Provider, MD  HYDROcodone-acetaminophen (NORCO/VICODIN) 5-325 MG tablet Take 1 tablet by mouth 2 (two) times daily as needed for moderate pain.   Yes Historical Provider, MD  ibuprofen (ADVIL,MOTRIN) 600 MG tablet Take 600 mg by mouth every 6 (six) hours as needed for mild pain.   Yes Historical Provider, MD  ipratropium-albuterol (DUONEB) 0.5-2.5 (3) MG/3ML SOLN Take 3 mLs by nebulization every 6 (six) hours as needed. Patient taking differently: See admin instructions. Drink the contents of 1 ampule every 6 hours as needed for shortness of breath 09/19/15  Yes Nonie Hoyer, PA-C  lactose free nutrition (BOOST) LIQD Take 237 mLs by mouth 3 (three) times daily between meals.   Yes Historical Provider, MD  LORazepam (ATIVAN) 0.5 MG tablet Take 0.5 mg by mouth every 12 (twelve) hours as needed for anxiety.   Yes Historical Provider, MD  metoprolol tartrate (LOPRESSOR) 25 MG tablet Take 25 mg by mouth every 8 (eight) hours.   Yes Historical Provider, MD  oxyCODONE (OXY IR/ROXICODONE) 5 MG immediate release tablet Take 5 mg by mouth every 4 (four) hours as needed for severe pain.   Yes Historical Provider, MD  protein supplement (RESOURCE BENEPROTEIN) POWD Take 1 scoop by mouth 3 (three) times daily. TO BE MIXED IN WITH BOOST DRINK   Yes Historical Provider, MD  simethicone  (GAS-X) 80 MG chewable tablet Chew 80 mg by mouth every 6 (six) hours as needed for flatulence.   Yes Historical Provider, MD  acetaminophen (TYLENOL) 160 MG/5ML solution Place 20.3 mLs (650 mg total) into feeding tube every 6 (six) hours as needed for mild pain, headache or fever. Patient not taking: Reported on 03/12/2016 09/19/15   Nonie Hoyer, PA-C  bisacodyl (DULCOLAX) 10 MG suppository Place 1 suppository (10 mg total) rectally daily as needed for moderate constipation. Patient not taking: Reported on 03/12/2016 09/19/15   Nonie Hoyer, PA-C  diltiazem (CARDIZEM) 10 mg/ml oral suspension Place 6 mLs (60 mg total) into feeding tube every 6 (six) hours. Patient not taking: Reported on 03/12/2016 09/19/15   Nonie Hoyer, PA-C  folic acid (FOLVITE) 1 MG tablet Place 1 tablet (1 mg total) into feeding tube daily. Patient not taking: Reported on 03/12/2016 09/19/15   Nonie Hoyer, PA-C  HYDROcodone-acetaminophen (HYCET) 7.5-325 mg/15 ml solution Take 10-20 mLs by mouth every 6 (six) hours as needed for moderate pain (10ml for mild pain, 15ml for moderate pain, 20ml for severe pain). Patient not taking: Reported on 03/12/2016 09/19/15   Nonie Hoyer, PA-C  loperamide (IMODIUM) 1 MG/5ML solution Place 10 mLs (2 mg total) into feeding tube 2 (two) times daily as needed for diarrhea or loose stools. Patient not taking: Reported on 03/12/2016 09/19/15   Nonie Hoyer, PA-C  metoprolol tartrate (LOPRESSOR) 25 mg/10 mL SUSP Place 20 mLs (50 mg total) into feeding tube every 8 (  eight) hours. Patient not taking: Reported on 03/12/2016 09/19/15   Nonie HoyerMegan N Baird, PA-C  Nutritional Supplements (FEEDING SUPPLEMENT, PIVOT 1.5 CAL,) LIQD Place 1,000 mLs into feeding tube continuous. Patient not taking: Reported on 03/12/2016 09/19/15   Nonie HoyerMegan N Baird, PA-C  thiamine 100 MG tablet Place 1 tablet (100 mg total) into feeding tube daily. Patient not taking: Reported on 03/12/2016 09/19/15   Nonie HoyerMegan N Baird, PA-C  Water For  Irrigation, Sterile (FREE WATER) SOLN Place 100 mLs into feeding tube every 4 (four) hours. Patient not taking: Reported on 03/12/2016 09/19/15   Nonie HoyerMegan N Baird, PA-C    Vital Signs: BP 112/74 (BP Location: Left Arm)   Pulse 98   Temp 98.2 F (36.8 C) (Oral)   Resp 18   Ht 6' (1.829 m)   Wt 149 lb (67.6 kg)   SpO2 95%   BMI 20.21 kg/m   Physical Exam: Lungs: CTAB, CT in place with seropurulent drainage  215cc in last 24hrs.  Drain site is c/d/i  Imaging: Dg Chest Port 1 View  Result Date: 03/14/2016 CLINICAL DATA:  Empyema.  Chest tubes in place. EXAM: PORTABLE CHEST 1 VIEW COMPARISON:  CT images for chest tube placement yesterday. One-view chest x-ray 03/12/2016. FINDINGS: A right-sided chest tube is in place. A right-sided pleural effusion appears be more layering on today's exam. There is persistent asymmetric airspace opacity. The left lung is clear. Thoracolumbar fusion is noted. Posterior right-sided rib fractures are again seen. IMPRESSION: 1. Persistent right pleural effusion following chest tube placement. 2. Right-sided rib fractures. 3. Right lung opacification is slightly improved. Electronically Signed   By: Marin Robertshristopher  Mattern M.D.   On: 03/14/2016 10:04   Ct Image Guided Fluid Drain By Catheter  Result Date: 03/13/2016 INDICATION: 50 year old male with loculated right-sided pleural effusion highly concerning for empyema EXAM: CT IMAGE GUIDED FLUID DRAIN BY CATHETER MEDICATIONS: The patient is currently admitted to the hospital and receiving intravenous antibiotics. The antibiotics were administered within an appropriate time frame prior to the initiation of the procedure. ANESTHESIA/SEDATION: Fentanyl 50 mcg IV; Versed 1 mg IV Moderate Sedation Time:  15 minutes The patient was continuously monitored during the procedure by the interventional radiology nurse under my direct supervision. COMPLICATIONS: None immediate. PROCEDURE: Informed written consent was obtained from the  patient after a thorough discussion of the procedural risks, benefits and alternatives. All questions were addressed. A timeout was performed prior to the initiation of the procedure. The patient was placed right lateral decubitus on the CT gantry. An initial planning axial CT scan was performed. The low-attenuation loculated pleural effusion the right pleural space was successfully identified. A suitable skin entry site was selected and marked. The region was sterilely prepped and draped in standard fashion using chlorhexidine skin prep. Local anesthesia was attained by infiltration with 1% lidocaine. A small dermatotomy was made. Under intermittent CT guidance an 18 gauge trocar needle was advanced into the fluid. The inner stylet was removed and a 0.035 Amplatz wire was coiled within the fluid collection. The skin tract was then dilated and a 20 [place]French Thal percutaneous drain was advanced over the wire and formed in the pleural space. Aspiration yields extremely thick purulent tan fluid. A sample was sent for Gram stain and culture. The catheter was secured to the skin with 0 Prolene suture. The empyema cavity was irrigated with sterile saline and then connected to low wall suction via a pleura vac at -20 cm H2O. Sterile bandages were applied. The patient  tolerated the procedure well. IMPRESSION: Successful placement of a 20 French percutaneous drain in the right pleural empyema. Aspiration yields at least 200 mL thick purulent fluid. Samples were sent for Gram stain and culture. Signed, Sterling BigHeath K. McCullough, MD [facility] Electronically Signed   By: Malachy MoanHeath  McCullough M.D.   On: 03/13/2016 17:04    Labs:  CBC:  Recent Labs  03/13/16 0413 03/14/16 0237 03/15/16 0738 03/16/16 0203  WBC 9.5 11.8* 7.9 8.5  HGB 7.6* 8.0* 7.8* 7.7*  HCT 26.9* 28.0* 26.9* 27.2*  PLT 490* 512* 497* 535*    COAGS:  Recent Labs  07/30/15 0145  07/30/15 0524 07/30/15 0735 03/12/16 0545  INR 1.09 1.31 1.28 1.16  APTT  --  27 26 33    BMP:  Recent Labs  03/13/16 0413 03/14/16 0237 03/15/16 0738 03/16/16 0203  NA 138 136 139 138  K 3.5 3.4* 3.6 3.6  CL 105 103 104 102  CO2 26 25 28 29   GLUCOSE 83 91 102* 92  BUN <5* <5* <5* 5*  CALCIUM 9.1 9.0 9.4 9.2  CREATININE 0.42* 0.45* 0.47* 0.56*  GFRNONAA >60 >60 >60 >60  GFRAA >60 >60 >60 >60    LIVER FUNCTION TESTS:  Recent Labs  07/30/15 0735 08/07/15 0415 08/22/15 0346 03/12/16 0545  BILITOT 1.6* 2.0* 0.9 0.8  AST 213* 52* 37 18  ALT 111* 61 60 22  ALKPHOS 40 45 115 76  PROT 4.9* 4.7* 5.8* 8.2*  ALBUMIN 2.7* 1.7* 1.7* 2.1*    Assessment and Plan: (R)empyema s/p perc chest tube 11/22 -G + cocci and G - rods -CT surgery suggests to continue current care.  Will follow CT drain.  Electronically Signed: Letha CapeSBORNE,Emerick Weatherly E 03/16/2016, 10:17 AM   I spent a total of 15 Minutes at the the patient's bedside AND on the patient's hospital floor or unit, greater than 50% of which was counseling/coordinating care for right empyema
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
## 2016-03-16 NOTE — Progress Notes (Signed)
CRITICAL VALUE ALERT  Critical value received:  Zenaida NieceVan trough Date of notification:  03/16/16  Time of notification:  1300  Critical value read back:yes  Nurse who received alert: Hiram Combererek Arlington Sigmund RN  MD notified (1st page):  Regalado   Time of first page:  1310  MD notified (2nd page):  Time of second page:  Responding MD:  Sunnie Nielsenegalado   Time MD responded:  1310
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
## 2016-03-17 ENCOUNTER — Inpatient Hospital Stay (HOSPITAL_COMMUNITY): Payer: BLUE CROSS/BLUE SHIELD

## 2016-03-17 LAB — CBC
HEMATOCRIT: 30 % — AB (ref 39.0–52.0)
Hemoglobin: 8.8 g/dL — ABNORMAL LOW (ref 13.0–17.0)
MCH: 24.1 pg — AB (ref 26.0–34.0)
MCHC: 29.3 g/dL — ABNORMAL LOW (ref 30.0–36.0)
MCV: 82.2 fL (ref 78.0–100.0)
PLATELETS: 543 10*3/uL — AB (ref 150–400)
RBC: 3.65 MIL/uL — AB (ref 4.22–5.81)
RDW: 16.9 % — AB (ref 11.5–15.5)
WBC: 10 10*3/uL (ref 4.0–10.5)

## 2016-03-17 LAB — TYPE AND SCREEN
ABO/RH(D): A POS
ANTIBODY SCREEN: NEGATIVE
Unit division: 0

## 2016-03-17 LAB — CULTURE, BLOOD (ROUTINE X 2)
CULTURE: NO GROWTH
Culture: NO GROWTH

## 2016-03-17 LAB — BASIC METABOLIC PANEL
ANION GAP: 7 (ref 5–15)
BUN: 7 mg/dL (ref 6–20)
CO2: 29 mmol/L (ref 22–32)
Calcium: 9.5 mg/dL (ref 8.9–10.3)
Chloride: 103 mmol/L (ref 101–111)
Creatinine, Ser: 0.6 mg/dL — ABNORMAL LOW (ref 0.61–1.24)
GFR calc Af Amer: >60 mL/min (ref >60)
GLUCOSE: 100 mg/dL — AB (ref 65–99)
POTASSIUM: 3.3 mmol/L — AB (ref 3.5–5.1)
Sodium: 139 mmol/L (ref 135–145)

## 2016-03-17 MED ORDER — POTASSIUM CHLORIDE CRYS ER 20 MEQ PO TBCR
40.0000 meq | EXTENDED_RELEASE_TABLET | Freq: Once | ORAL | Status: AC
  Administered 2016-03-17: 40 meq via ORAL
  Filled 2016-03-17: qty 2
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
## 2016-03-18 LAB — BASIC METABOLIC PANEL
Anion gap: 8 (ref 5–15)
BUN: 6 mg/dL (ref 6–20)
CHLORIDE: 104 mmol/L (ref 101–111)
CO2: 26 mmol/L (ref 22–32)
Calcium: 9.5 mg/dL (ref 8.9–10.3)
Creatinine, Ser: 0.63 mg/dL (ref 0.61–1.24)
GFR calc Af Amer: >60 mL/min (ref >60)
GFR calc non Af Amer: >60 mL/min (ref >60)
GLUCOSE: 143 mg/dL — AB (ref 65–99)
POTASSIUM: 3.4 mmol/L — AB (ref 3.5–5.1)
Sodium: 138 mmol/L (ref 135–145)

## 2016-03-18 LAB — CBC
HEMATOCRIT: 31.1 % — AB (ref 39.0–52.0)
HEMOGLOBIN: 9.3 g/dL — AB (ref 13.0–17.0)
MCH: 24.7 pg — AB (ref 26.0–34.0)
MCHC: 29.9 g/dL — ABNORMAL LOW (ref 30.0–36.0)
MCV: 82.5 fL (ref 78.0–100.0)
Platelets: 577 10*3/uL — ABNORMAL HIGH (ref 150–400)
RBC: 3.77 MIL/uL — AB (ref 4.22–5.81)
RDW: 17.5 % — ABNORMAL HIGH (ref 11.5–15.5)
WBC: 12.5 10*3/uL — ABNORMAL HIGH (ref 4.0–10.5)

## 2016-03-18 LAB — GLUCOSE, CAPILLARY: Glucose-Capillary: 94 mg/dL (ref 65–99)

## 2016-03-18 LAB — VANCOMYCIN, TROUGH: VANCOMYCIN TR: 17 ug/mL (ref 15–20)

## 2016-03-18 MED ORDER — TRAMADOL HCL 50 MG PO TABS
50.0000 mg | ORAL_TABLET | Freq: Three times a day (TID) | ORAL | Status: DC | PRN
  Administered 2016-03-20 – 2016-03-25 (×5): 50 mg via ORAL
  Filled 2016-03-18 (×5): qty 1

## 2016-03-18 MED ORDER — POTASSIUM CHLORIDE CRYS ER 20 MEQ PO TBCR
40.0000 meq | EXTENDED_RELEASE_TABLET | Freq: Once | ORAL | Status: AC
  Administered 2016-03-18: 40 meq via ORAL
  Filled 2016-03-18: qty 2

## 2016-03-18 NOTE — Consult Note (Signed)
WOC Nurse wound follow up Wound type: Stage 3 pressure injuries with NPWT Measurement: see previous WOC notes Wound bed: both areas sacrum and right ischium are clean and pink with early granulation, bone palpable in the sacral wound Drainage (amount, consistency, odor) moderate, serosanguinous in the VAC canister Periwound: intact Dressing procedure/placement/frequency: Removed old dressings.  Skin protected to the right hip and between the two wounds.  1pc of white foam used to fill undermined section on the sacral wound.  1pc of white foam used to fill tunneled area in the right ischial wound. 1pc of black foam used to fill the remainder of the sacral wound and bridged to the ischial wound.  1pc of black foam used to fill the remainder of the ischial wound.  Seal obtained at 125mmHG.  Wife at bedside.  Patient received IV pain meds prior to my arrival to the floor.  Low air loss mattress in place to provide pressure redistribution.  WOC will follow along with you for NPWT dressing changes M/W/F.  Will assess other wounds weekly on Wednesdays.  Rayanna Matusik [facility] MSN, RN, [place], CNS [phone]

## 2016-03-18 NOTE — Progress Notes (Signed)
  Subjective: IR pigtail catheter for empyema CXR improved Leave tube until drainage < 50 cc/day  Objective: Vital signs in last 24 hours: Temp:  [98 F (36.7 C)-99 F (37.2 C)] 98 F (36.7 C) (11/27 1512) Pulse Rate:  [89-121] 121 (11/27 1512) Cardiac Rhythm: Normal sinus rhythm (11/27 0700) Resp:  [18] 18 (11/27 0613) BP: (119-129)/(78-87) 119/82 (11/27 1512) SpO2:  [94 %-97 %] 97 % (11/27 1512)  Hemodynamic parameters for last 24 hours:    Intake/Output from previous day: 11/26 0701 - 11/27 0700 In: 510 [P.O.:510] Out: 1250 [Urine:1100; Stool:150] Intake/Output this shift: Total I/O In: 1077 [P.O.:1077] Out: 2000 [Urine:1600; Drains:400]       Exam    General- alert and comfortable   Lungs- clear without rales, wheezes - R chest drain intact   Cor- regular rate and rhythm, no murmur , gallop   Abdomen- soft, non-tender   Extremities - warm, non-tender, minimal edema   Neuro- oriented, appropriate, no focal weakness   Lab Results:  Recent Labs  03/17/16 0149 03/18/16 1249  WBC 10.0 12.5*  HGB 8.8* 9.3*  HCT 30.0* 31.1*  PLT 543* 577*   BMET:  Recent Labs  03/17/16 0149 03/18/16 1249  NA 139 138  K 3.3* 3.4*  CL 103 104  CO2 29 26  GLUCOSE 100* 143*  BUN 7 6  CREATININE 0.60* 0.63  CALCIUM 9.5 9.5    PT/INR: No results for input(s): LABPROT, INR in the last 72 hours. 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

## 2016-03-18 NOTE — Progress Notes (Signed)
Patient lying in bed, no needs at this time. Advised not to pull on IV line. Will continue to monitor, call light within reach
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
## 2016-03-18 NOTE — Progress Notes (Signed)
Referring Physician(s): Dr Donata ClayVan Trigt  Supervising Physician: Gilmer MorWagner, Jaime  [facility]  Chief Complaint:  Right empyema drain placed 11/22  Subjective:  Draining well 610 cc in Pleurvac Purulent output Pt is tearful; seems uncomfortable  Allergies: Patient has no known allergies.  Medications: Prior to Admission medications   Medication Sig Start Date End Date Taking? Authorizing Provider  collagenase (SANTYL) ointment Apply topically daily. To sacral wound 09/19/15  Yes Megan N Baird, PA-C  diltiazem (CARDIZEM) 60 MG tablet Take 60 mg by mouth daily.   Yes Historical Provider, MD  HYDROcodone-acetaminophen (NORCO/VICODIN) 5-325 MG tablet Take 1 tablet by mouth 2 (two) times daily as needed for moderate pain.   Yes Historical Provider, MD  ibuprofen (ADVIL,MOTRIN) 600 MG tablet Take 600 mg by mouth every 6 (six) hours as needed for mild pain.   Yes Historical Provider, MD  ipratropium-albuterol (DUONEB) 0.5-2.5 (3) MG/3ML SOLN Take 3 mLs by nebulization every 6 (six) hours as needed. Patient taking differently: See admin instructions. Drink the contents of 1 ampule every 6 hours as needed for shortness of breath 09/19/15  Yes Nonie HoyerMegan N Baird, PA-C  lactose free nutrition (BOOST) LIQD Take 237 mLs by mouth 3 (three) times daily between meals.   Yes Historical Provider, MD  LORazepam (ATIVAN) 0.5 MG tablet Take 0.5 mg by mouth every 12 (twelve) hours as needed for anxiety.   Yes Historical Provider, MD  metoprolol tartrate (LOPRESSOR) 25 MG tablet Take 25 mg by mouth every 8 (eight) hours.   Yes Historical Provider, MD  oxyCODONE (OXY IR/ROXICODONE) 5 MG immediate release tablet Take 5 mg by mouth every 4 (four) hours as needed for severe pain.   Yes Historical Provider, MD  protein supplement (RESOURCE BENEPROTEIN) POWD Take 1 scoop by mouth 3 (three) times daily. TO BE MIXED IN WITH BOOST DRINK   Yes Historical Provider, MD  simethicone (GAS-X) 80 MG chewable  tablet Chew 80 mg by mouth every 6 (six) hours as needed for flatulence.   Yes Historical Provider, MD  acetaminophen (TYLENOL) 160 MG/5ML solution Place 20.3 mLs (650 mg total) into feeding tube every 6 (six) hours as needed for mild pain, headache or fever. Patient not taking: Reported on 03/12/2016 09/19/15   Nonie HoyerMegan N Baird, PA-C  bisacodyl (DULCOLAX) 10 MG suppository Place 1 suppository (10 mg total) rectally daily as needed for moderate constipation. Patient not taking: Reported on 03/12/2016 09/19/15   Nonie HoyerMegan N Baird, PA-C  diltiazem (CARDIZEM) 10 mg/ml oral suspension Place 6 mLs (60 mg total) into feeding tube every 6 (six) hours. Patient not taking: Reported on 03/12/2016 09/19/15   Nonie HoyerMegan N Baird, PA-C  folic acid (FOLVITE) 1 MG tablet Place 1 tablet (1 mg total) into feeding tube daily. Patient not taking: Reported on 03/12/2016 09/19/15   Nonie HoyerMegan N Baird, PA-C  HYDROcodone-acetaminophen (HYCET) 7.5-325 mg/15 ml solution Take 10-20 mLs by mouth every 6 (six) hours as needed for moderate pain (10ml for mild pain, 15ml for moderate pain, 20ml for severe pain). Patient not taking: Reported on 03/12/2016 09/19/15   Nonie HoyerMegan N Baird, PA-C  loperamide (IMODIUM) 1 MG/5ML solution Place 10 mLs (2 mg total) into feeding tube 2 (two) times daily as needed for diarrhea or loose stools. Patient not taking: Reported on 03/12/2016 09/19/15   Nonie HoyerMegan N Baird, PA-C  metoprolol tartrate (LOPRESSOR) 25 mg/10 mL SUSP Place 20 mLs (50 mg total) into feeding tube every 8 (eight) hours. Patient not taking: Reported  on 03/12/2016 09/19/15   Nonie Hoyer, PA-C  Nutritional Supplements (FEEDING SUPPLEMENT, PIVOT 1.5 CAL,) LIQD Place 1,000 mLs into feeding tube continuous. Patient not taking: Reported on 03/12/2016 09/19/15   Nonie Hoyer, PA-C  thiamine 100 MG tablet Place 1 tablet (100 mg total) into feeding tube daily. Patient not taking: Reported on 03/12/2016 09/19/15   Nonie Hoyer, PA-C  Water For Irrigation, Sterile  (FREE WATER) SOLN Place 100 mLs into feeding tube every 4 (four) hours. Patient not taking: Reported on 03/12/2016 09/19/15   Nonie Hoyer, PA-C     Vital Signs: BP 129/87 (BP Location: Right Arm)   Pulse 96   Temp 98.5 F (36.9 C) (Oral)   Resp 18   Ht 6' (1.829 m)   Wt 150 lb (68 kg)   SpO2 96%   BMI 20.34 kg/m   Physical Exam  Neurological: He is alert.  Skin: Skin is warm.  Site of chest tube is clean and dry. No bleeding No air leak 610 cc purulent output in pleurvac   Nursing note and vitals reviewed.   CXR 11/26: IMPRESSION: No change compared to most recent chest x-ray of 03/14/2016, as detailed above. Right pleural effusion appears stable.  Imaging: Dg Chest 2 View  Result Date: 03/17/2016 CLINICAL DATA:  Pleural effusion. EXAM: CHEST  2 VIEW COMPARISON:  Chest x-rays dated 03/14/2016 03/12/2016. FINDINGS: Heart size and mediastinal contours are normal. Right-sided chest tube is stable in position. The right-sided pleural effusion appears stable. Patchy airspace opacities persist on the right, likely edema and/or atelectasis, stable compared to the most recent exam of 03/14/2016, improved compared to the earlier exam of 03/12/2016. Left lung remains clear. Again noted are the multiple right-sided rib fractures, not appreciably changed in alignment. Thoracolumbar fusion hardware appears stable in alignment. IMPRESSION: No change compared to most recent chest x-ray of 03/14/2016, as detailed above. Right pleural effusion appears stable. Electronically Signed   By: Bary Richard M.D.   On: 03/17/2016 08:53    Labs:  CBC:  Recent Labs  03/14/16 0237 03/15/16 0738 03/16/16 0203 03/17/16 0149  WBC 11.8* 7.9 8.5 10.0  HGB 8.0* 7.8* 7.7* 8.8*  HCT 28.0* 26.9* 27.2* 30.0*  PLT 512* 497* 535* 543*    COAGS:  Recent Labs  07/30/15 0145 07/30/15 0524 07/30/15 0735 03/12/16 0545  INR 1.09 1.31 1.28 1.16  APTT  --  27 26 33    BMP:  Recent Labs   03/14/16 0237 03/15/16 0738 03/16/16 0203 03/17/16 0149  NA 136 139 138 139  K 3.4* 3.6 3.6 3.3*  CL 103 104 102 103  CO2 25 28 29 29   GLUCOSE 91 102* 92 100*  BUN <5* <5* 5* 7  CALCIUM 9.0 9.4 9.2 9.5  CREATININE 0.45* 0.47* 0.56* 0.60*  GFRNONAA >60 >60 >60 >60  GFRAA >60 >60 >60 >60    LIVER FUNCTION TESTS:  Recent Labs  07/30/15 0735 08/07/15 0415 08/22/15 0346 03/12/16 0545  BILITOT 1.6* 2.0* 0.9 0.8  AST 213* 52* 37 18  ALT 111* 61 60 22  ALKPHOS 40 45 115 76  PROT 4.9* 4.7* 5.8* 8.2*  ALBUMIN 2.7* 1.7* 1.7* 2.1*    Assessment and Plan:  Rt empyema Chest tube drain intact Will follow Plan per Dr Donata Clay  Electronically Signed: Ralene Muskrat A 03/18/2016, 9:41 AM   I spent a total of 15 Minutes at the the patient's bedside AND on the patient's hospital floor or unit, greater than  50% of which was counseling/coordinating care for right chest tube drain
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
## 2016-03-19 DIAGNOSIS — F17210 Nicotine dependence, cigarettes, uncomplicated: Secondary | ICD-10-CM

## 2016-03-19 DIAGNOSIS — Z8249 Family history of ischemic heart disease and other diseases of the circulatory system: Secondary | ICD-10-CM

## 2016-03-19 DIAGNOSIS — N319 Neuromuscular dysfunction of bladder, unspecified: Secondary | ICD-10-CM

## 2016-03-19 DIAGNOSIS — J189 Pneumonia, unspecified organism: Secondary | ICD-10-CM

## 2016-03-19 DIAGNOSIS — Z933 Colostomy status: Secondary | ICD-10-CM

## 2016-03-19 DIAGNOSIS — Z87820 Personal history of traumatic brain injury: Secondary | ICD-10-CM

## 2016-03-19 DIAGNOSIS — L89159 Pressure ulcer of sacral region, unspecified stage: Secondary | ICD-10-CM

## 2016-03-19 DIAGNOSIS — B9689 Other specified bacterial agents as the cause of diseases classified elsewhere: Secondary | ICD-10-CM

## 2016-03-19 DIAGNOSIS — Z981 Arthrodesis status: Secondary | ICD-10-CM

## 2016-03-19 DIAGNOSIS — L89899 Pressure ulcer of other site, unspecified stage: Secondary | ICD-10-CM

## 2016-03-19 LAB — CBC
HEMATOCRIT: 30 % — AB (ref 39.0–52.0)
HEMOGLOBIN: 8.6 g/dL — AB (ref 13.0–17.0)
MCH: 24.1 pg — ABNORMAL LOW (ref 26.0–34.0)
MCHC: 28.7 g/dL — ABNORMAL LOW (ref 30.0–36.0)
MCV: 84 fL (ref 78.0–100.0)
Platelets: 533 10*3/uL — ABNORMAL HIGH (ref 150–400)
RBC: 3.57 MIL/uL — ABNORMAL LOW (ref 4.22–5.81)
RDW: 18 % — ABNORMAL HIGH (ref 11.5–15.5)
WBC: 12.9 10*3/uL — AB (ref 4.0–10.5)

## 2016-03-19 LAB — BASIC METABOLIC PANEL
ANION GAP: 7 (ref 5–15)
BUN: 7 mg/dL (ref 6–20)
CHLORIDE: 103 mmol/L (ref 101–111)
CO2: 28 mmol/L (ref 22–32)
Calcium: 9.6 mg/dL (ref 8.9–10.3)
Creatinine, Ser: 0.64 mg/dL (ref 0.61–1.24)
GFR calc non Af Amer: >60 mL/min (ref >60)
Glucose, Bld: 108 mg/dL — ABNORMAL HIGH (ref 65–99)
Potassium: 4.3 mmol/L (ref 3.5–5.1)
Sodium: 138 mmol/L (ref 135–145)

## 2016-03-19 LAB — SEDIMENTATION RATE: Sed Rate: 124 mm/hr — ABNORMAL HIGH (ref 0–16)

## 2016-03-19 LAB — C-REACTIVE PROTEIN: CRP: 8.4 mg/dL — AB (ref <1.0)

## 2016-03-19 MED ORDER — DEXTROSE 5 % IV SOLN
2.0000 g | INTRAVENOUS | Status: DC
  Administered 2016-03-19 – 2016-03-26 (×8): 2 g via INTRAVENOUS
  Filled 2016-03-19 (×8): qty 2
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
## 2016-03-19 NOTE — Progress Notes (Signed)
Referring Physician(s): Kathlene CoteVan Trigt,P  Supervising Physician: Irish LackYamagata, Glenn  [facility]  Chief Complaint:  right chest empyema  Subjective: Pt eating breakfast; no acute changes   Allergies: Patient has no known allergies.  Medications: Prior to Admission medications   Medication Sig Start Date End Date Taking? Authorizing Provider  collagenase (SANTYL) ointment Apply topically daily. To sacral wound 09/19/15  Yes Megan N Baird, PA-C  diltiazem (CARDIZEM) 60 MG tablet Take 60 mg by mouth daily.   Yes Historical Provider, MD  HYDROcodone-acetaminophen (NORCO/VICODIN) 5-325 MG tablet Take 1 tablet by mouth 2 (two) times daily as needed for moderate pain.   Yes Historical Provider, MD  ibuprofen (ADVIL,MOTRIN) 600 MG tablet Take 600 mg by mouth every 6 (six) hours as needed for mild pain.   Yes Historical Provider, MD  ipratropium-albuterol (DUONEB) 0.5-2.5 (3) MG/3ML SOLN Take 3 mLs by nebulization every 6 (six) hours as needed. Patient taking differently: See admin instructions. Drink the contents of 1 ampule every 6 hours as needed for shortness of breath 09/19/15  Yes Nonie HoyerMegan N Baird, PA-C  lactose free nutrition (BOOST) LIQD Take 237 mLs by mouth 3 (three) times daily between meals.   Yes Historical Provider, MD  LORazepam (ATIVAN) 0.5 MG tablet Take 0.5 mg by mouth every 12 (twelve) hours as needed for anxiety.   Yes Historical Provider, MD  metoprolol tartrate (LOPRESSOR) 25 MG tablet Take 25 mg by mouth every 8 (eight) hours.   Yes Historical Provider, MD  oxyCODONE (OXY IR/ROXICODONE) 5 MG immediate release tablet Take 5 mg by mouth every 4 (four) hours as needed for severe pain.   Yes Historical Provider, MD  protein supplement (RESOURCE BENEPROTEIN) POWD Take 1 scoop by mouth 3 (three) times daily. TO BE MIXED IN WITH BOOST DRINK   Yes Historical Provider, MD  simethicone (GAS-X) 80 MG chewable tablet Chew 80 mg by mouth every 6 (six) hours as needed for  flatulence.   Yes Historical Provider, MD  acetaminophen (TYLENOL) 160 MG/5ML solution Place 20.3 mLs (650 mg total) into feeding tube every 6 (six) hours as needed for mild pain, headache or fever. Patient not taking: Reported on 03/12/2016 09/19/15   Nonie HoyerMegan N Baird, PA-C  bisacodyl (DULCOLAX) 10 MG suppository Place 1 suppository (10 mg total) rectally daily as needed for moderate constipation. Patient not taking: Reported on 03/12/2016 09/19/15   Nonie HoyerMegan N Baird, PA-C  diltiazem (CARDIZEM) 10 mg/ml oral suspension Place 6 mLs (60 mg total) into feeding tube every 6 (six) hours. Patient not taking: Reported on 03/12/2016 09/19/15   Nonie HoyerMegan N Baird, PA-C  folic acid (FOLVITE) 1 MG tablet Place 1 tablet (1 mg total) into feeding tube daily. Patient not taking: Reported on 03/12/2016 09/19/15   Nonie HoyerMegan N Baird, PA-C  HYDROcodone-acetaminophen (HYCET) 7.5-325 mg/15 ml solution Take 10-20 mLs by mouth every 6 (six) hours as needed for moderate pain (10ml for mild pain, 15ml for moderate pain, 20ml for severe pain). Patient not taking: Reported on 03/12/2016 09/19/15   Nonie HoyerMegan N Baird, PA-C  loperamide (IMODIUM) 1 MG/5ML solution Place 10 mLs (2 mg total) into feeding tube 2 (two) times daily as needed for diarrhea or loose stools. Patient not taking: Reported on 03/12/2016 09/19/15   Nonie HoyerMegan N Baird, PA-C  metoprolol tartrate (LOPRESSOR) 25 mg/10 mL SUSP Place 20 mLs (50 mg total) into feeding tube every 8 (eight) hours. Patient not taking: Reported on 03/12/2016 09/19/15   Nonie HoyerMegan N Baird, PA-C  Nutritional Supplements (FEEDING SUPPLEMENT, PIVOT 1.5 CAL,) LIQD Place 1,000 mLs into feeding tube continuous. Patient not taking: Reported on 03/12/2016 09/19/15   Nonie HoyerMegan N Baird, PA-C  thiamine 100 MG tablet Place 1 tablet (100 mg total) into feeding tube daily. Patient not taking: Reported on 03/12/2016 09/19/15   Nonie HoyerMegan N Baird, PA-C  Water For Irrigation, Sterile (FREE WATER) SOLN Place 100 mLs into feeding tube every 4  (four) hours. Patient not taking: Reported on 03/12/2016 09/19/15   Nonie HoyerMegan N Baird, PA-C     Vital Signs: BP 119/85 (BP Location: Right Arm)   Pulse (!) 103   Temp 97.5 F (36.4 C) (Oral)   Resp 18   Ht 6' (1.829 m)   Wt 150 lb (68 kg)   SpO2 97%   BMI 20.34 kg/m   Physical Exam rt chest drain intact, output so far today 30 cc; cx's- abundant arcanobacterium haemolyticum  Imaging: Dg Chest 2 View  Result Date: 03/17/2016 CLINICAL DATA:  Pleural effusion. EXAM: CHEST  2 VIEW COMPARISON:  Chest x-rays dated 03/14/2016 03/12/2016. FINDINGS: Heart size and mediastinal contours are normal. Right-sided chest tube is stable in position. The right-sided pleural effusion appears stable. Patchy airspace opacities persist on the right, likely edema and/or atelectasis, stable compared to the most recent exam of 03/14/2016, improved compared to the earlier exam of 03/12/2016. Left lung remains clear. Again noted are the multiple right-sided rib fractures, not appreciably changed in alignment. Thoracolumbar fusion hardware appears stable in alignment. IMPRESSION: No change compared to most recent chest x-ray of 03/14/2016, as detailed above. Right pleural effusion appears stable. Electronically Signed   By: Bary RichardStan  Maynard M.D.   On: 03/17/2016 08:53    Labs:  CBC:  Recent Labs  03/16/16 0203 03/17/16 0149 03/18/16 1249 03/19/16 0206  WBC 8.5 10.0 12.5* 12.9*  HGB 7.7* 8.8* 9.3* 8.6*  HCT 27.2* 30.0* 31.1* 30.0*  PLT 535* 543* 577* 533*    COAGS:  Recent Labs  07/30/15 0145 07/30/15 0524 07/30/15 0735 03/12/16 0545  INR 1.09 1.31 1.28 1.16  APTT  --  27 26 33    BMP:  Recent Labs  03/16/16 0203 03/17/16 0149 03/18/16 1249 03/19/16 0206  NA 138 139 138 138  K 3.6 3.3* 3.4* 4.3  CL 102 103 104 103  CO2 29 29 26 28   GLUCOSE 92 100* 143* 108*  BUN 5* 7 6 7   CALCIUM 9.2 9.5 9.5 9.6  CREATININE 0.56* 0.60* 0.63 0.64  GFRNONAA >60 >60 >60 >60  GFRAA >60 >60 >60 >60     LIVER FUNCTION TESTS:  Recent Labs  07/30/15 0735 08/07/15 0415 08/22/15 0346 03/12/16 0545  BILITOT 1.6* 2.0* 0.9 0.8  AST 213* 52* 37 18  ALT 111* 61 60 22  ALKPHOS 40 45 115 76  PROT 4.9* 4.7* 5.8* 8.2*  ALBUMIN 2.7* 1.7* 1.7* 2.1*    Assessment and Plan: Rt chest empyema, s/p perc drain placement 11/22; afebrile; WBC 12.9(12.5), hgb 8.6(9.3); creat ok; cx's- arcanobacterium haemolyticum; output of drain so far today 30 cc; as per TCTS once output < 50 cc /day plan to remove drain- will cont to monitor and if total output today remains < 50 cc will remove drain 11/29  Electronically Signed: D. Jeananne RamaKevin Khan Chura 03/19/2016, 9:00 AM   I spent a total of 15 minutes at the the patient's bedside AND on the patient's hospital floor or unit, greater than 50% of which was counseling/coordinating care for right chest drain  Patient ID: [name], male   DOB: 1965/04/30, 50 y.o.   MRN: [id number]
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
## 2016-03-19 NOTE — Progress Notes (Signed)
  Subjective: Left parapneumonic effusion-empyema drained with a pigtail catheter placed by IR Drainage has been scant in the past 24 hours If no further changes recommend remove pigtail catheter tomorrow  Objective: Vital signs in last 24 hours: Temp:  [97.5 F (36.4 C)-97.7 F (36.5 C)] 97.5 F (36.4 C) (11/28 0606) Pulse Rate:  [103-113] 103 (11/28 0606) Cardiac Rhythm: Normal sinus rhythm (11/28 0700) Resp:  [18] 18 (11/28 0606) BP: (113-119)/(70-85) 119/85 (11/28 0606) SpO2:  [97 %-98 %] 97 % (11/28 0606)  Hemodynamic parameters for last 24 hours:   Stable Intake/Output from previous day: 11/27 0701 - 11/28 0700 In: 1577 [P.O.:1577] Out: 5350 [Urine:4500; Drains:420; Stool:400; Chest Tube:30] Intake/Output this shift: Total I/O In: 945 [P.O.:945] Out: 275 [Urine:275]    Lab Results:  Recent Labs  03/18/16 1249 03/19/16 0206  WBC 12.5* 12.9*  HGB 9.3* 8.6*  HCT 31.1* 30.0*  PLT 577* 533*   BMET:  Recent Labs  03/18/16 1249 03/19/16 0206  NA 138 138  K 3.4* 4.3  CL 104 103  CO2 26 28  GLUCOSE 143* 108*  BUN 6 7  CREATININE 0.63 0.64  CALCIUM 9.5 9.6    PT/INR: No results for input(s): LABPROT, INR in the last 72 hours. ABG    Component Value Date/Time   PHART 7.458 (H) 08/12/2015 0915   HCO3 28.8 (H) 08/12/2015 0915   TCO2 30.0 08/12/2015 0915   ACIDBASEDEF 4.0 (H) 07/31/2015 1826   O2SAT 95.9 08/12/2015 0915   CBG (last 3)   Recent Labs  03/18/16 0642  GLUCAP 94    Assessment/Plan: S/P  DC left pleural drain in a.m. 11-29 if drainage remains less than 50 cc for 24 hours   LOS: 7 days    [name] 03/19/2016

## 2016-03-20 ENCOUNTER — Encounter (HOSPITAL_COMMUNITY): Payer: Self-pay | Admitting: General Practice

## 2016-03-20 ENCOUNTER — Inpatient Hospital Stay (HOSPITAL_COMMUNITY): Payer: BLUE CROSS/BLUE SHIELD

## 2016-03-20 DIAGNOSIS — L89314 Pressure ulcer of right buttock, stage 4: Secondary | ICD-10-CM

## 2016-03-20 DIAGNOSIS — L89324 Pressure ulcer of left buttock, stage 4: Secondary | ICD-10-CM

## 2016-03-20 DIAGNOSIS — M4628 Osteomyelitis of vertebra, sacral and sacrococcygeal region: Secondary | ICD-10-CM

## 2016-03-20 DIAGNOSIS — L89154 Pressure ulcer of sacral region, stage 4: Secondary | ICD-10-CM

## 2016-03-20 LAB — BASIC METABOLIC PANEL
ANION GAP: 11 (ref 5–15)
BUN: 8 mg/dL (ref 6–20)
CALCIUM: 10.4 mg/dL — AB (ref 8.9–10.3)
CO2: 26 mmol/L (ref 22–32)
CREATININE: 0.58 mg/dL — AB (ref 0.61–1.24)
Chloride: 99 mmol/L — ABNORMAL LOW (ref 101–111)
GFR calc Af Amer: >60 mL/min (ref >60)
GLUCOSE: 67 mg/dL (ref 65–99)
Potassium: 3.8 mmol/L (ref 3.5–5.1)
Sodium: 136 mmol/L (ref 135–145)

## 2016-03-20 LAB — HEPATITIS C ANTIBODY: HCV Ab: 6 s/co ratio — ABNORMAL HIGH (ref 0.0–0.9)

## 2016-03-20 LAB — CBC
HCT: 31 % — ABNORMAL LOW (ref 39.0–52.0)
Hemoglobin: 8.9 g/dL — ABNORMAL LOW (ref 13.0–17.0)
MCH: 24.3 pg — AB (ref 26.0–34.0)
MCHC: 28.7 g/dL — AB (ref 30.0–36.0)
MCV: 84.7 fL (ref 78.0–100.0)
PLATELETS: 554 10*3/uL — AB (ref 150–400)
RBC: 3.66 MIL/uL — ABNORMAL LOW (ref 4.22–5.81)
RDW: 18.4 % — AB (ref 11.5–15.5)
WBC: 10.5 10*3/uL (ref 4.0–10.5)

## 2016-03-20 LAB — HIV ANTIBODY (ROUTINE TESTING W REFLEX): HIV Screen 4th Generation wRfx: NONREACTIVE

## 2016-03-20 NOTE — Care Management Note (Signed)
Case Management Note Donn PieriniKristi Sagan Wurzel RN, BSN Unit 2W-Case Manager [phone]  Patient Details  Name: Julien NordmannDonald Wayne Gargan MRN: [id number] Date of Birth: [birth year]  Subjective/Objective:  Pt admitted with sepsis                Action/Plan: PTA pt lived at home with wife- spoke with wife at bedside- per conversation pt has hospital bed, w/c, lift, and shower chair at home- [facility]- call made to agency to confirm services- spoke with Lupita Leashonna- pt was active with HHRN/PT/OT/aide/social work- will need [facility] for [facility]- (fax # [facility] is [phone]) per wife pt also has [employer] wound vac - with HHRN drg changes every other day- pt was going to wound care center on Wednesdays. CM will continue to follow for d/c needs   Expected Discharge Date:                  Expected Discharge Plan:  Home w Home Health Services  In-House Referral:     Discharge planning Services  CM Consult  Post Acute Care Choice:  Home Health, Resumption of Svcs/PTA Provider Choice offered to:  Spouse  DME Arranged:    DME Agency:     HH Arranged:  RN, PT, Nurse's Aide, OT, Social Work [facility] Agency:  [facility]  Status of Service:  In process, will continue to follow  If discussed at Long Length of Stay Meetings, dates discussed:  11/28, 11/30  Discharge Disposition: home with home health   Additional Comments:  03/20/16- 1445- Donn PieriniKristi Daven Pinckney RN, CM- pt will need IV abx at discharge through Dec. 19- [facility] call from ID regarding abx needs- call also made to StilwellKendra [facility] to alert them of IV abx needs- and d/c plans- pt will need resumption orders for HHRN/PT/OT/aide/SW- will fax these to [facility] when available. CM to continue to follow- pt will need EMS transport home on discharge.  Darrold SpanWebster, Shloimy Michalski Hall, RN 03/20/2016,  2:49 PM
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
## 2016-03-20 NOTE — Progress Notes (Signed)
Late entry: PRBC given via 20 gauge to right wrist. Blood verified with Karle BarrJames RN. See v/s per flowsheet. Pt with no reactions.
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
## 2016-03-20 NOTE — Progress Notes (Signed)
[facility]  [facility]  [facility] and [facility].  [facility] will provide Home Infusion Pharmacy services for home IV ABX as ordered by Dr. Drue SecondSnider for home.  [facility] will follow while inpatient to support transition home when Ordered.  If patient discharges after hours, please call [phone].   [name] 03/20/2016, 2:29 PM

## 2016-03-20 NOTE — Progress Notes (Signed)
Referring Physician(s): Kathlene CoteVan Trigt,P  Supervising Physician: Irish LackYamagata, Glenn  [facility]  [facility]  Chief Complaint:  Right chest empyema  Subjective:  Pt without acute changes  Allergies: Patient has no known allergies.  Medications: Prior to Admission medications   Medication Sig Start Date End Date Taking? Authorizing Provider  collagenase (SANTYL) ointment Apply topically daily. To sacral wound 09/19/15  Yes Megan N Baird, PA-C  diltiazem (CARDIZEM) 60 MG tablet Take 60 mg by mouth daily.   Yes Historical Provider, MD  HYDROcodone-acetaminophen (NORCO/VICODIN) 5-325 MG tablet Take 1 tablet by mouth 2 (two) times daily as needed for moderate pain.   Yes Historical Provider, MD  ibuprofen (ADVIL,MOTRIN) 600 MG tablet Take 600 mg by mouth every 6 (six) hours as needed for mild pain.   Yes Historical Provider, MD  ipratropium-albuterol (DUONEB) 0.5-2.5 (3) MG/3ML SOLN Take 3 mLs by nebulization every 6 (six) hours as needed. Patient taking differently: See admin instructions. Drink the contents of 1 ampule every 6 hours as needed for shortness of breath 09/19/15  Yes Nonie HoyerMegan N Baird, PA-C  lactose free nutrition (BOOST) LIQD Take 237 mLs by mouth 3 (three) times daily between meals.   Yes Historical Provider, MD  LORazepam (ATIVAN) 0.5 MG tablet Take 0.5 mg by mouth every 12 (twelve) hours as needed for anxiety.   Yes Historical Provider, MD  metoprolol tartrate (LOPRESSOR) 25 MG tablet Take 25 mg by mouth every 8 (eight) hours.   Yes Historical Provider, MD  oxyCODONE (OXY IR/ROXICODONE) 5 MG immediate release tablet Take 5 mg by mouth every 4 (four) hours as needed for severe pain.   Yes Historical Provider, MD  protein supplement (RESOURCE BENEPROTEIN) POWD Take 1 scoop by mouth 3 (three) times daily. TO BE MIXED IN WITH BOOST DRINK   Yes Historical Provider, MD  simethicone (GAS-X) 80 MG chewable tablet Chew 80 mg by mouth every 6 (six) hours as needed for flatulence.    Yes Historical Provider, MD  acetaminophen (TYLENOL) 160 MG/5ML solution Place 20.3 mLs (650 mg total) into feeding tube every 6 (six) hours as needed for mild pain, headache or fever. Patient not taking: Reported on 03/12/2016 09/19/15   Nonie HoyerMegan N Baird, PA-C  bisacodyl (DULCOLAX) 10 MG suppository Place 1 suppository (10 mg total) rectally daily as needed for moderate constipation. Patient not taking: Reported on 03/12/2016 09/19/15   Nonie HoyerMegan N Baird, PA-C  diltiazem (CARDIZEM) 10 mg/ml oral suspension Place 6 mLs (60 mg total) into feeding tube every 6 (six) hours. Patient not taking: Reported on 03/12/2016 09/19/15   Nonie HoyerMegan N Baird, PA-C  folic acid (FOLVITE) 1 MG tablet Place 1 tablet (1 mg total) into feeding tube daily. Patient not taking: Reported on 03/12/2016 09/19/15   Nonie HoyerMegan N Baird, PA-C  HYDROcodone-acetaminophen (HYCET) 7.5-325 mg/15 ml solution Take 10-20 mLs by mouth every 6 (six) hours as needed for moderate pain (10ml for mild pain, 15ml for moderate pain, 20ml for severe pain). Patient not taking: Reported on 03/12/2016 09/19/15   Nonie HoyerMegan N Baird, PA-C  loperamide (IMODIUM) 1 MG/5ML solution Place 10 mLs (2 mg total) into feeding tube 2 (two) times daily as needed for diarrhea or loose stools. Patient not taking: Reported on 03/12/2016 09/19/15   Nonie HoyerMegan N Baird, PA-C  metoprolol tartrate (LOPRESSOR) 25 mg/10 mL SUSP Place 20 mLs (50 mg total) into feeding tube every 8 (eight) hours. Patient not taking: Reported on 03/12/2016 09/19/15   Nonie HoyerMegan N Baird, PA-C  Nutritional Supplements (  FEEDING SUPPLEMENT, PIVOT 1.5 CAL,) LIQD Place 1,000 mLs into feeding tube continuous. Patient not taking: Reported on 03/12/2016 09/19/15   Nonie Hoyer, PA-C  thiamine 100 MG tablet Place 1 tablet (100 mg total) into feeding tube daily. Patient not taking: Reported on 03/12/2016 09/19/15   Nonie Hoyer, PA-C  Water For Irrigation, Sterile (FREE WATER) SOLN Place 100 mLs into feeding tube every 4 (four)  hours. Patient not taking: Reported on 03/12/2016 09/19/15   Nonie Hoyer, PA-C     Vital Signs: BP 109/74 (BP Location: Left Arm)   Pulse (!) 103   Temp 97.6 F (36.4 C) (Oral)   Resp 18   Ht 6' (1.829 m)   Wt 140 lb (63.5 kg)   SpO2 97%   BMI 18.99 kg/m   Physical Exam rt chest drain intact, dressing dry, output today 10cc  Imaging: Dg Chest 2 View  Result Date: 03/17/2016 CLINICAL DATA:  Pleural effusion. EXAM: CHEST  2 VIEW COMPARISON:  Chest x-rays dated 03/14/2016 03/12/2016. FINDINGS: Heart size and mediastinal contours are normal. Right-sided chest tube is stable in position. The right-sided pleural effusion appears stable. Patchy airspace opacities persist on the right, likely edema and/or atelectasis, stable compared to the most recent exam of 03/14/2016, improved compared to the earlier exam of 03/12/2016. Left lung remains clear. Again noted are the multiple right-sided rib fractures, not appreciably changed in alignment. Thoracolumbar fusion hardware appears stable in alignment. IMPRESSION: No change compared to most recent chest x-ray of 03/14/2016, as detailed above. Right pleural effusion appears stable. Electronically Signed   By: Bary Richard M.D.   On: 03/17/2016 08:53   Dg Chest Port 1 View  Result Date: 03/20/2016 CLINICAL DATA:  Shortness of breath. EXAM: PORTABLE CHEST 1 VIEW COMPARISON:  Radiographs of March 17, 2016. FINDINGS: The heart size and mediastinal contours are within normal limits. No pneumothorax is noted. Right-sided chest tube is unchanged in position. Left lung is clear. Stable right basilar opacity is noted concerning for atelectasis or infiltrate. Stable left rib fractures are noted. IMPRESSION: Stable right-sided chest tube without pneumothorax. Stable right basilar opacity as described above. Electronically Signed   By: Lupita Raider, M.D.   On: 03/20/2016 08:00    Labs:  CBC:  Recent Labs  03/17/16 0149 03/18/16 1249  03/19/16 0206 03/20/16 0420  WBC 10.0 12.5* 12.9* 10.5  HGB 8.8* 9.3* 8.6* 8.9*  HCT 30.0* 31.1* 30.0* 31.0*  PLT 543* 577* 533* 554*    COAGS:  Recent Labs  07/30/15 0145 07/30/15 0524 07/30/15 0735 03/12/16 0545  INR 1.09 1.31 1.28 1.16  APTT  --  27 26 33    BMP:  Recent Labs  03/17/16 0149 03/18/16 1249 03/19/16 0206 03/20/16 0420  NA 139 138 138 136  K 3.3* 3.4* 4.3 3.8  CL 103 104 103 99*  CO2 29 26 28 26   GLUCOSE 100* 143* 108* 67  BUN 7 6 7 8   CALCIUM 9.5 9.5 9.6 10.4*  CREATININE 0.60* 0.63 0.64 0.58*  GFRNONAA >60 >60 >60 >60  GFRAA >60 >60 >60 >60    LIVER FUNCTION TESTS:  Recent Labs  07/30/15 0735 08/07/15 0415 08/22/15 0346 03/12/16 0545  BILITOT 1.6* 2.0* 0.9 0.8  AST 213* 52* 37 18  ALT 111* 61 60 22  ALKPHOS 40 45 115 76  PROT 4.9* 4.7* 5.8* 8.2*  ALBUMIN 2.7* 1.7* 1.7* 2.1*    Assessment and Plan: Rt chest empyema, s/p perc drain placement  11/22; afebrile; WBC nl; HGB 8.9(8.6); drain output 10 cc today, per TCTS order rt chest drain removed in its entirety without immediate complications and vaseline gauze dressing applied to site, f/u CXR pend   Electronically Signed: D. Jeananne RamaKevin Allred 03/20/2016, 10:03 AM   I spent a total of 20 minutes at the the patient's bedside AND on the patient's hospital floor or unit, greater than 50% of which was counseling/coordinating care for right chest drain     Patient ID: [name], [name]   DOB: 04/04/1966, 50 y.o.   MRN: [id number]

## 2016-03-20 NOTE — Consult Note (Signed)
WOC Nurse wound follow up Wound type: Stage 4 Pressure injury right ischium: clean, pink, moist with early granulation tissue; 6cmx 5cm x 1.0cm with 2.0cm tunnel at o'clock  Stage 4 Pressure injury sacrum: clean, pink, moist, early granulation, bone palpable; 8cm x 4cm x 2.0cm x with undermining from 9-2 o'clock  Stage 4 injury left ischium: did not assess today Measurement:see above Wound bed: see above  Drainage (amount, consistency, odor) serosanguinous in canister moderate amount Periwound: intact  Dressing procedure/placement/frequency: 1pc of white foam used in undermined area of the sacrum and 1pc of white foam used in the tunnel in the right ischial wound.  1pc of black foam used to fill remainder of the wound bed in the sacrum and the right ischium.  Protected skin between the wounds and for a foam bridge to the right hip.  1pc of black foam used for each bridge. Total 4 pc of black foam/2 pc of white foam. Seal at 125mmHG.  Patient tolerated well, received IV pain meds prior to dressing change.  WOC will follow along with you for NPWT VAC dressing changes due to complexity of change.  Renwick Asman [facility] MSN, RN, [place], CNS [phone]

## 2016-03-21 DIAGNOSIS — J90 Pleural effusion, not elsewhere classified: Secondary | ICD-10-CM

## 2016-03-21 DIAGNOSIS — J869 Pyothorax without fistula: Secondary | ICD-10-CM

## 2016-03-21 DIAGNOSIS — L98429 Non-pressure chronic ulcer of back with unspecified severity: Secondary | ICD-10-CM

## 2016-03-21 NOTE — Progress Notes (Signed)
Palliative Medicine consult noted. Due to high referral volume, there may be a delay seeing this patient. Please call the Palliative Medicine Team office at [phone] if recommendations are needed in the interim.  Thank you for inviting us to see this patient.  Margret ChanceMelanie G. Shakur Lembo, RN, BSN, [facility] 03/21/2016 3:03 PM Cell [phone] 8:00-4:00 Monday-Friday Office [phone]
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
## 2016-03-22 DIAGNOSIS — F482 Pseudobulbar affect: Secondary | ICD-10-CM

## 2016-03-22 DIAGNOSIS — F329 Major depressive disorder, single episode, unspecified: Secondary | ICD-10-CM

## 2016-03-22 MED ORDER — FUROSEMIDE 10 MG/ML IJ SOLN: 20.0000 mg | Freq: Once | INTRAMUSCULAR | Status: DC

## 2016-03-22 MED ORDER — SODIUM CHLORIDE 0.9% FLUSH
10.0000 mL | INTRAVENOUS | Status: DC | PRN
  Administered 2016-03-24 – 2016-03-26 (×3): 10 mL
  Filled 2016-03-22 (×3): qty 40

## 2016-03-22 MED ORDER — ZOLPIDEM TARTRATE 5 MG PO TABS
10.0000 mg | ORAL_TABLET | Freq: Every day | ORAL | Status: DC
  Administered 2016-03-22 – 2016-03-24 (×3): 10 mg via ORAL
  Filled 2016-03-22 (×3): qty 2

## 2016-03-22 MED ORDER — METOPROLOL TARTRATE 12.5 MG HALF TABLET
12.5000 mg | ORAL_TABLET | Freq: Two times a day (BID) | ORAL | Status: DC
  Administered 2016-03-22 – 2016-03-26 (×8): 12.5 mg via ORAL
  Filled 2016-03-22 (×8): qty 1

## 2016-03-22 MED ORDER — SODIUM CHLORIDE 0.9 % IV BOLUS (SEPSIS)
250.0000 mL | Freq: Once | INTRAVENOUS | Status: AC
  Administered 2016-03-22: 250 mL via INTRAVENOUS

## 2016-03-22 MED ORDER — CITALOPRAM HYDROBROMIDE 20 MG PO TABS
20.0000 mg | ORAL_TABLET | Freq: Every day | ORAL | Status: DC
  Administered 2016-03-23 – 2016-03-26 (×4): 20 mg via ORAL
  Filled 2016-03-22 (×4): qty 1

## 2016-03-22 MED ORDER — CLONAZEPAM 1 MG PO TABS
1.0000 mg | ORAL_TABLET | Freq: Two times a day (BID) | ORAL | Status: DC
  Administered 2016-03-23 – 2016-03-26 (×6): 1 mg via ORAL
  Filled 2016-03-22 (×6): qty 1

## 2016-03-22 NOTE — Progress Notes (Signed)
Peripherally Inserted Central Catheter/Midline Placement  The IV Nurse has discussed with the patient and/or persons authorized to consent for the patient, the purpose of this procedure and the potential benefits and risks involved with this procedure.  The benefits include less needle sticks, lab draws from the catheter, and the patient may be discharged home with the catheter. Risks include, but not limited to, infection, bleeding, blood clot (thrombus formation), and puncture of an artery; nerve damage and irregular heartbeat and possibility to perform a PICC exchange if needed/ordered by physician.  Alternatives to this procedure were also discussed.  Bard Power PICC patient education guide, fact sheet on infection prevention and patient information card has been provided to patient /or left at bedside.    PICC/Midline Placement Documentation  PICC Single Lumen 03/22/16 PICC Right Basilic 41 cm 0 cm (Active)  Indication for Insertion or Continuance of Line Home intravenous therapies (PICC only) 03/22/2016 12:00 PM  Exposed Catheter (cm) 0 cm 03/22/2016 12:00 PM  Dressing Change Due 03/29/16 03/22/2016 12:00 PM       Stacie GlazeJoyce, Darleny Sem Horton 03/22/2016, 12:07 PM
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
## 2016-03-22 NOTE — Progress Notes (Addendum)
Palliative Medicine RN Note: Chart reviewed. Pt's RN called PMT and stated pt is getting close to D/C. It may be another 24-48 hours before PMT provider can evaluate patient. Discussed with PMT DO, Dr Phillips OdorGolding. Recommend outpt Palliative Care, as pt will likely continue to pursue aggressive care. Per discussion w Dr Phillips OdorGolding, placed order for outpt Palliative Care to be set up at home.  Margret ChanceMelanie G. Honor Frison, RN, BSN, Amsc LLCCHPN 03/22/2016 11:36 AM Cell [phone] 8:00-4:00 Monday-Friday Office [phone]

## 2016-03-22 NOTE — Care Management Note (Signed)
Case Management Note Donn PieriniKristi Vangie Henthorn RN, BSN Unit 2W-Case Manager [phone]  Patient Details  Name: William NordmannDonald Wayne Grosser MRN: [id number] Date of Birth: December 23, 1965  Subjective/Objective:  Pt admitted with sepsis                Action/Plan: PTA pt lived at home with wife- spoke with wife at bedside- per conversation pt has hospital bed, w/c, lift, and shower chair at home- [facility]- call made to agency to confirm services- spoke with Lupita Leashonna- pt was active with HHRN/PT/OT/aide/social work- [facility] for [facility]- (fax [facility] [phone]) per wife pt also has [employer] wound vac - with HHRN drg changes every other day- pt was going to wound care center on Wednesdays. CM will continue to follow for d/c needs   Expected Discharge Date:     03/23/16             Expected Discharge Plan:  Home w Home Health Services  In-House Referral:     Discharge planning Services  CM Consult  Post Acute Care Choice:  Home Health, Resumption of Svcs/PTA Provider Choice offered to:  Spouse, Patient  DME Arranged:    DME Agency:     HH Arranged:  RN, PT, Nurse's Aide, OT, Social Work [employer] Agency:  [facility]  Status of Service:  Completed, signed off  If discussed at [employer] Length of Stay Meetings, dates discussed:  11/28, 11/30  Discharge Disposition: home with home health   Additional Comments:  03/22/16- 1430- Donn PieriniKristi Dustin Bumbaugh RN, CM - referral received for outpt PC referral- spoke with pt at bedside- who is agreeable- [place]- [facility]- referral called [facility]- per MD-PMT-Dr. Phillips OdorGolding to see pt this evening pt will be ready for d/c in am-  Call [facility] to [facility]- msg left- [facility] and have [facility]. Pam [facility] following for IV abx- will need  prescription prior to discharge for IV abx.   03/20/16- 1445- Donn PieriniKristi Pavielle Biggar RN, CM- pt will need IV abx at discharge through Dec. 19- [facility] from ID regarding abx needs- call also made to [place]endra [facility] to alert them of IV abx needs- and d/c plans- pt will need resumption orders for HHRN/PT/OT/aide/SW- [facility]. CM to continue to follow- pt will need EMS transport home on discharge.  Darrold SpanWebster, Brigitt Mcclish Hall, RN 03/22/2016, 2:32 PM
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
## 2016-03-22 NOTE — Consult Note (Signed)
Wound type: Stage 4 Pressure injury right ischium: clean, pink, moist with early granulation tissue; 6cmx 5cm x 1.0cm with 2.0cm tunnel at 4 o'clock  Stage 4 Pressure injury sacrum: clean, pink, moist, early granulation, bone palpable; 8cm x 4cm x 2.0cm x with undermining from  4-6 o'clock  Stage 4 injury left ischium: did not assess today Measurement:see above Wound bed: see above  Drainage (amount, consistency, odor): scant amount of serosanguinous in canister  Periwound: intact  Dressing procedure/placement/frequency: 1pc of white foam used in undermined area of the sacrum and 1pc of white foam used in the tunnel in the right ischial wound.  1pc of black foam used to fill remainder of the wound bed in the sacrum and the right ischium.  Protected skin between the wounds and for a foam bridge to the right hip.  1pc of black foam used for each bridge. Total 4 pc of black foam/2 pc of white foam. Seal at 125mmHG.  Patient tolerated well, received IV pain meds prior to dressing change. Hooked to home vac for discharge. Next dresssing change Monday December 4th  Anna GenreKeatah Elby ShowersBrooks FNP-C, [facility] student   Anum Palecek MartinezAustin MSN, RN, [place], [place] [phone]
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
## 2016-03-23 NOTE — Progress Notes (Signed)
[facility]  is prepared for weekend DC.  [facility] is also on standby for weekend DC and is able to resume services upon DC back to home. Wife has been trained to administer the IV Rocephin and did very well. [facility] will support her in the home to ensure they are competent and comfortable once home.  [facility] still needs Rocephin script to provide for the pt at home upon DC.   Thank you.  If patient discharges after hours, please call [phone].   Sedalia Mutaamela S Chandler 03/23/2016, 7:40 AM

## 2016-03-24 LAB — BASIC METABOLIC PANEL
ANION GAP: 9 (ref 5–15)
BUN: 9 mg/dL (ref 6–20)
CHLORIDE: 99 mmol/L — AB (ref 101–111)
CO2: 28 mmol/L (ref 22–32)
Calcium: 10.6 mg/dL — ABNORMAL HIGH (ref 8.9–10.3)
Creatinine, Ser: 0.48 mg/dL — ABNORMAL LOW (ref 0.61–1.24)
GFR calc non Af Amer: >60 mL/min (ref >60)
GLUCOSE: 84 mg/dL (ref 65–99)
Potassium: 3.8 mmol/L (ref 3.5–5.1)
Sodium: 136 mmol/L (ref 135–145)

## 2016-03-24 LAB — CBC
HEMATOCRIT: 32.8 % — AB (ref 39.0–52.0)
HEMOGLOBIN: 9.6 g/dL — AB (ref 13.0–17.0)
MCH: 24.4 pg — ABNORMAL LOW (ref 26.0–34.0)
MCHC: 29.3 g/dL — ABNORMAL LOW (ref 30.0–36.0)
MCV: 83.5 fL (ref 78.0–100.0)
Platelets: 573 10*3/uL — ABNORMAL HIGH (ref 150–400)
RBC: 3.93 MIL/uL — ABNORMAL LOW (ref 4.22–5.81)
RDW: 18.2 % — AB (ref 11.5–15.5)
WBC: 10.5 10*3/uL (ref 4.0–10.5)

## 2016-03-24 LAB — MAGNESIUM: Magnesium: 2.1 mg/dL (ref 1.7–2.4)
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
## 2016-03-25 ENCOUNTER — Encounter (HOSPITAL_COMMUNITY): Payer: Self-pay | Admitting: Physical Medicine and Rehabilitation

## 2016-03-25 DIAGNOSIS — J869 Pyothorax without fistula: Secondary | ICD-10-CM

## 2016-03-25 DIAGNOSIS — D638 Anemia in other chronic diseases classified elsewhere: Secondary | ICD-10-CM

## 2016-03-25 DIAGNOSIS — G8220 Paraplegia, unspecified: Secondary | ICD-10-CM

## 2016-03-25 DIAGNOSIS — I10 Essential (primary) hypertension: Secondary | ICD-10-CM

## 2016-03-25 DIAGNOSIS — S069X0S Unspecified intracranial injury without loss of consciousness, sequela: Secondary | ICD-10-CM

## 2016-03-25 DIAGNOSIS — R000 Tachycardia, unspecified: Secondary | ICD-10-CM

## 2016-03-25 DIAGNOSIS — R52 Pain, unspecified: Secondary | ICD-10-CM

## 2016-03-25 DIAGNOSIS — L89004 Pressure ulcer of unspecified elbow, stage 4: Secondary | ICD-10-CM

## 2016-03-25 DIAGNOSIS — Z515 Encounter for palliative care: Secondary | ICD-10-CM
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
## 2016-03-25 NOTE — Progress Notes (Addendum)
Notified by Weldon PickingSusan Blankenship with CIR that they would not be extending a bed offer to pt and are recommending SNF for rehab- call made to pt's wife- Olegario MessierKathy- discussion about recommendations had via TC- at this time pt's wife is declining SNF for rehab and states that [facility]. She understands that the recommendations are for rehab and states that she remembers the conversation she had with the MD this am in reference to pt going to rehab- however she has decided that she wants to bring pt back home. Have notified MD of wife decision- and [facility].

## 2016-03-25 NOTE — Progress Notes (Signed)
I visited pt. bat the bedside.  He was sleeping and was unable to participate in meaningful discussion regarding post acute care needs.  I phoned his wife, Mirl Hillery and we discussed the recommendation for SNF as pt. unlikely can tolerate CIR and that his care needs unlikely to be met in the home environment.  Wife states "I cannot do that to him" *(place pt. In SNF)  and that "if he had gotten better care at the skilled facility, he would be walking now".  Wife does not seem to have full understanding of pt's medical status or care needs.  I have updated Marvetta Gibbons, RNCM of the above.  I will sign off.  Please call if questions.  [facility] Admissions Coordinator Cell [phone] Office [phone]

## 2016-03-25 NOTE — Consult Note (Signed)
WOC Nurse wound follow up Wound type:  Stage 4 pressure injuries Bilateral ischial; sacrum Bilateral lateral LE wounds; full thickness; unknown etilogy Wound bed: Right and left ischium both clean, pink with early granulation tissue, right ischium is filling in well, continues to have a tunnel at 4 o'clock  Sacrum: is filling in nicely as well, undermining has healed in now. Left ischium is 100% pink, will DC use of enzymatic debridement ointment  Drainage (amount, consistency, odor) minimal from each wound, no odor, serosanguinous  Periwound: intact  Dressing procedure/placement/frequency: 1pc of white foam to fill the tunnel of the right ischium 1pc of black foam used in each wound (sacrum and right ischium) 2pc of foam used for bridge (1 to the right hip and 1 between the two wounds) Total 4 pc of black foam, 1pc of white foam Sealed at 1525mmHG,patient tolerated well.  Received IV pain meds prior to dressing change.   WOC team will follow along with you for complex NPWT dressing changes  [facility] MSN, RN, [place], CNS [phone]
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
## 2016-03-26 MED ORDER — DEXTROSE 5 % IV SOLN: 2.0000 g | INTRAVENOUS | 0 refills | Status: AC

## 2016-03-26 MED ORDER — CITALOPRAM HYDROBROMIDE 20 MG PO TABS
20.0000 mg | ORAL_TABLET | Freq: Every day | ORAL | 0 refills | Status: AC
Start: 2016-03-26 — End: ?

## 2016-03-26 MED ORDER — BENEPROTEIN PO POWD
1.0000 | Freq: Three times a day (TID) | ORAL | 0 refills | Status: AC
Start: 2016-03-26 — End: ?

## 2016-03-26 MED ORDER — METOPROLOL TARTRATE 25 MG PO TABS: 25.0000 mg | ORAL_TABLET | Freq: Two times a day (BID) | ORAL | Status: AC

## 2016-03-26 MED ORDER — HEPARIN SOD (PORK) LOCK FLUSH 100 UNIT/ML IV SOLN
250.0000 [IU] | INTRAVENOUS | Status: AC | PRN
  Administered 2016-03-26: 250 [IU]

## 2016-03-26 MED ORDER — METOPROLOL TARTRATE 25 MG PO TABS: 25.0000 mg | ORAL_TABLET | Freq: Two times a day (BID) | ORAL | 0 refills | Status: DC

## 2016-03-26 NOTE — Care Management Note (Signed)
Case Management Note Donn PieriniKristi Gautam Langhorst RN, BSN Unit 2W-Case Manager [phone]  Patient Details  Name: Julien NordmannDonald Wayne Waltrip MRN: [id number] Date of Birth: 1966-02-04  Subjective/Objective:  Pt admitted with sepsis                Action/Plan: PTA pt lived at home with wife- spoke with wife at bedside- per conversation pt has hospital bed, w/c, lift, and shower chair at home- [facility]- call made to agency to confirm services- spoke with Lupita Leashonna- pt was active with HHRN/PT/OT/aide/social work- will need [facility] when stable for discharge- (fax [facility] is [phone]) per wife pt also has [employer] wound vac - with HHRN drg changes every other day- pt was going to wound care center on Wednesdays. CM will continue to follow for d/c needs   Expected Discharge Date:     03/26/16             Expected Discharge Plan:  Home w Home Health Services  In-House Referral:     Discharge planning Services  CM Consult  Post Acute Care Choice:  Home Health, Resumption of Svcs/PTA Provider Choice offered to:  Spouse, Patient  DME Arranged:    DME Agency:     HH Arranged:  RN, PT, Nurse's Aide, OT, Social Work [employer] Agency:  [facility]  Status of Service:  Completed, signed off  If discussed at [employer] Length of Stay Meetings, dates discussed:  11/28, 11/30, 12/5  Discharge Disposition: home with home health   Additional Comments:  03/26/16- 1130- Donn PieriniKristi Beryl Balz RN, CM- [facility]- [facility]- spoke with Enrique SackKendra- will resume services- 12/6- for IV abx- [facility]- [facility] week for [facility] of [facility] services- will fax d/c summary when available- pt will return home with home wound VAC which wife will bring to hospital-  Bedside RN to give today's dose of IV abx  prior to d/c.   03/25/16- 1500- Donn PieriniKristi Puanani Gene RN, CM- PC consult completed, have also consulted CIR for possible admit- spoke with Darl PikesSusan rehab admission RN- and per rehab consult they feel pt more appropriate for SNF- wife has been informed by CIR of decision- call made to wife by this CM to discuss STSNF option- wife states that she does not want pt to go back to SNF and instead wants [facility]- have notified MD of wife's decision.   03/22/16- 1430- Donn PieriniKristi Long Brimage RN, CM - referral received for outpt PC referral- spoke with pt at bedside- who is agreeable- [place]- [facility] [place] [facility]- referral called [facility]Marie [facility]- per MD-PMT-Dr. Phillips OdorGolding to see pt this evening pt will be ready for d/c in am-  [facility]- msg left- [facility] and [facility]. [facility]- will need prescription prior to discharge for IV abx.   03/20/16- 1445- Donn PieriniKristi Spenser Harren RN, CM- pt will need IV abx at discharge through Dec. 19- [facility] from [facility] regarding abx needs- call also made to Av[place]endra at [facility] to alert them of IV abx needs- and d/c plans- pt will need resumption orders for HHRN/PT/OT/aide/SW- will [facility] these [facility]  when available. CM to continue to follow- pt will need EMS transport home on discharge.  Darrold SpanWebster, Chabely Norby Hall, RN 03/26/2016, 11:32 AM

## 2016-03-26 NOTE — Progress Notes (Signed)
Reviewed discharge instructions with patient and family. Patient lifted into wheelchair for d/c. MD notified of needing a script. Spoke with pharmacy and they were able to send that over. Patient left with family.   Sohail Capraro, Charlaine DaltonAnn Brooke RN
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
## 2016-03-26 NOTE — Discharge Instructions (Signed)
Follow with Primary MD in 7 days   Get CBC, CMP, 2 view Chest X ray checked  by Primary MD or SNF MD in 5-7 days ( we routinely change or add medications that can affect your baseline labs and fluid status, therefore we recommend that you get the mentioned basic workup next visit with your PCP, your PCP may decide not to get them or add new tests based on their clinical decision)   Activity: As tolerated with Full fall precautions use walker/cane & assistance as needed   Disposition Home     Diet:   Diet regular with feeding assistance and aspiration precautions.  For Heart failure patients - Check your Weight same time everyday, if you gain over 2 pounds, or you develop in leg swelling, experience more shortness of breath or chest pain, call your Primary MD immediately. Follow Cardiac Low Salt Diet and 1.5 lit/day fluid restriction.   On your next visit with your primary care physician please Get Medicines reviewed and adjusted.   Please request your Prim.MD to go over all Hospital Tests and Procedure/Radiological results at the follow up, please get all Hospital records sent to your Prim MD by signing hospital release before you go home.   If you experience worsening of your admission symptoms, develop shortness of breath, life threatening emergency, suicidal or homicidal thoughts you must seek medical attention immediately by calling 911 or calling your MD immediately  if symptoms less severe.  You Must read complete instructions/literature along with all the possible adverse reactions/side effects for all the Medicines you take and that have been prescribed to you. Take any new Medicines after you have completely understood and accpet all the possible adverse reactions/side effects.   Do not drive, operate heavy machinery, perform activities at heights, swimming or participation in water activities or provide baby sitting services if your were admitted for syncope or siezures until you  have seen by Primary MD or a Neurologist and advised to do so again.  Do not drive when taking Pain medications.    Do not take more than prescribed Pain, Sleep and Anxiety Medications  Special Instructions: If you have smoked or chewed Tobacco  in the last 2 yrs please stop smoking, stop any regular Alcohol  and or any Recreational drug use.  Wear Seat belts while driving.   Please note  You were cared for by a hospitalist during your hospital stay. If you have any questions about your discharge medications or the care you received while you were in the hospital after you are discharged, you can call the unit and asked to speak with the hospitalist on call if the hospitalist that took care of you is not available. Once you are discharged, your primary care physician will handle any further medical issues. Please note that NO REFILLS for any discharge medications will be authorized once you are discharged, as it is imperative that you return to your primary care physician (or establish a relationship with a primary care physician if you do not have one) for your aftercare needs so that they can reassess your need for medications and monitor your lab values.

## 2016-03-31 LAB — AEROBIC/ANAEROBIC CULTURE (SURGICAL/DEEP WOUND)

## 2016-03-31 LAB — AEROBIC/ANAEROBIC CULTURE W GRAM STAIN (SURGICAL/DEEP WOUND)

## 2016-04-01 ENCOUNTER — Other Ambulatory Visit: Payer: Self-pay | Admitting: Cardiothoracic Surgery

## 2016-04-01 DIAGNOSIS — J869 Pyothorax without fistula: Secondary | ICD-10-CM

## 2016-04-01 LAB — MISC LABCORP TEST (SEND OUT): Labcorp test code: 182808

## 2016-04-02 ENCOUNTER — Ambulatory Visit
Admission: RE | Admit: 2016-04-02 | Discharge: 2016-04-02 | Disposition: A | Discharge status: Home / Self Care | Payer: BLUE CROSS/BLUE SHIELD | Source: Ambulatory Visit | Attending: Cardiothoracic Surgery | Admitting: Cardiothoracic Surgery

## 2016-04-02 ENCOUNTER — Encounter: Payer: Self-pay | Admitting: Cardiothoracic Surgery

## 2016-04-02 ENCOUNTER — Ambulatory Visit (INDEPENDENT_AMBULATORY_CARE_PROVIDER_SITE_OTHER): Payer: BLUE CROSS/BLUE SHIELD | Admitting: Cardiothoracic Surgery

## 2016-04-02 VITALS — BP 99/68 | HR 110 | Resp 20 | Ht 72.0 in | Wt 137.0 lb

## 2016-04-02 DIAGNOSIS — J869 Pyothorax without fistula: Secondary | ICD-10-CM
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
## 2016-04-10 ENCOUNTER — Encounter: Payer: Self-pay | Admitting: Internal Medicine

## 2016-04-10 ENCOUNTER — Ambulatory Visit (INDEPENDENT_AMBULATORY_CARE_PROVIDER_SITE_OTHER): Payer: BLUE CROSS/BLUE SHIELD | Admitting: Internal Medicine

## 2016-04-10 VITALS — BP 108/79 | HR 105 | Temp 97.9°F

## 2016-04-10 DIAGNOSIS — M4628 Osteomyelitis of vertebra, sacral and sacrococcygeal region: Secondary | ICD-10-CM

## 2016-04-10 NOTE — Patient Instructions (Signed)
Send pictures of wound to [phone]
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
## 2016-04-11 ENCOUNTER — Telehealth: Payer: Self-pay | Admitting: Pharmacist

## 2016-04-11 NOTE — Telephone Encounter (Signed)
Per Dr. Drue SecondSnider, called Amy at [facility] and gave verbal to continue the patient's Rocephin and add vancomycin until seen in clinic again on 05/14/16.  Will make sure to obtain labs as [facility] nursing [facility]. Amy verbalized understanding.

## 2016-04-30 ENCOUNTER — Other Ambulatory Visit: Payer: Self-pay | Admitting: Cardiothoracic Surgery

## 2016-04-30 DIAGNOSIS — J90 Pleural effusion, not elsewhere classified: Secondary | ICD-10-CM

## 2016-05-01 ENCOUNTER — Ambulatory Visit: Payer: BLUE CROSS/BLUE SHIELD | Attending: General Surgery | Admitting: Physical Therapy

## 2016-05-01 ENCOUNTER — Encounter: Payer: BLUE CROSS/BLUE SHIELD | Admitting: Cardiothoracic Surgery

## 2016-05-01 DIAGNOSIS — M6281 Muscle weakness (generalized): Secondary | ICD-10-CM

## 2016-05-01 DIAGNOSIS — R2689 Other abnormalities of gait and mobility: Secondary | ICD-10-CM | POA: Insufficient documentation
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
## 2016-05-08 ENCOUNTER — Encounter: Payer: BLUE CROSS/BLUE SHIELD | Admitting: Cardiothoracic Surgery

## 2016-05-13 ENCOUNTER — Telehealth: Payer: Self-pay | Admitting: *Deleted

## 2016-05-13 NOTE — Telephone Encounter (Signed)
Yes. Have been following him for quite some time.  His abx were extended when he saw [name] last. His "stop date" is tomorrow 1/23 but he also was going to see Dr. Baxter Logan tomorrow too to reevaluate.  Dr. Baxter Logan - his most recent labs --- ESR 94 on 1/15 and CRP 36.6 1/15, doesn't look like they are budging much.  Will refer to Dr. Baxter Logan on when to stop abx. Labs were done yesterday too but haven't received them yet. Should we extend until he can see you? Thx!

## 2016-05-13 NOTE — Telephone Encounter (Signed)
Called the patient to reschedule his appt due to the burst pipe. The patient and his wife were fine just wanted to make sure someone was getting his labs and checking them he still has antibiotics on hand and is comfortable waiting to see the doctor as long as someone is keeping tabs on the medication and labs. Advised will make sure our pharmacist are in the loop and keep them informed and if he needs more he will get it or if he needs to stop that can happen as well.

## 2016-05-14 ENCOUNTER — Ambulatory Visit: Payer: BLUE CROSS/BLUE SHIELD | Admitting: Internal Medicine

## 2016-05-15 ENCOUNTER — Ambulatory Visit (INDEPENDENT_AMBULATORY_CARE_PROVIDER_SITE_OTHER): Payer: BLUE CROSS/BLUE SHIELD | Admitting: Cardiothoracic Surgery

## 2016-05-15 ENCOUNTER — Ambulatory Visit
Admission: RE | Admit: 2016-05-15 | Discharge: 2016-05-15 | Disposition: A | Discharge status: Home / Self Care | Payer: BLUE CROSS/BLUE SHIELD | Source: Ambulatory Visit | Attending: Cardiothoracic Surgery | Admitting: Cardiothoracic Surgery

## 2016-05-15 VITALS — BP 100/60 | HR 108 | Resp 16 | Ht 72.0 in | Wt 137.0 lb

## 2016-05-15 DIAGNOSIS — J90 Pleural effusion, not elsewhere classified: Secondary | ICD-10-CM

## 2016-05-15 DIAGNOSIS — J869 Pyothorax without fistula: Secondary | ICD-10-CM | POA: Diagnosis not present

## 2016-05-15 NOTE — Telephone Encounter (Signed)
Extend abtx until we see him, hopefully can be seen soon
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
## 2016-05-16 ENCOUNTER — Telehealth: Payer: Self-pay | Admitting: Pharmacist

## 2016-05-16 NOTE — Telephone Encounter (Signed)
Called Amy to verify patient still had PICC line and he does.  Extended his abx until 2/14 -- no f/u rescheduled yet. Hopefully can see soon.

## 2016-05-20 ENCOUNTER — Encounter: Payer: Self-pay | Admitting: Internal Medicine

## 2016-05-21 ENCOUNTER — Telehealth: Payer: Self-pay
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
## 2016-05-31 ENCOUNTER — Encounter: Payer: Self-pay | Admitting: Internal Medicine

## 2016-05-31 ENCOUNTER — Ambulatory Visit (INDEPENDENT_AMBULATORY_CARE_PROVIDER_SITE_OTHER): Payer: BLUE CROSS/BLUE SHIELD | Admitting: Internal Medicine

## 2016-05-31 VITALS — BP 107/71 | HR 114 | Temp 97.9°F

## 2016-05-31 DIAGNOSIS — L89324 Pressure ulcer of left buttock, stage 4: Secondary | ICD-10-CM | POA: Diagnosis not present

## 2016-05-31 DIAGNOSIS — R630 Anorexia: Secondary | ICD-10-CM | POA: Diagnosis not present

## 2016-05-31 DIAGNOSIS — M4628 Osteomyelitis of vertebra, sacral and sacrococcygeal region: Secondary | ICD-10-CM

## 2016-05-31 NOTE — Patient Instructions (Signed)
We will plan to finish out your abtx on feb 14th.  We will arrange for home health to pull picc line  Continue with wound care and wound care visits  Please let wound care doctor know you are finished with antibiotics. If they think that he needs more abtx, they can contact us for advice
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
## 2016-06-05 ENCOUNTER — Telehealth: Payer: Self-pay | Admitting: *Deleted

## 2016-06-05 NOTE — Telephone Encounter (Signed)
Sp[facility] with [facility] at [facility] to confirm stop date and pull picc order per Dr Drue SecondSnider. [facility]. Andree CossHowell, Cecila Satcher M, RN
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
## 2016-10-07 DIAGNOSIS — G8220 Paraplegia, unspecified: Secondary | ICD-10-CM | POA: Diagnosis not present

## 2016-10-07 DIAGNOSIS — N319 Neuromuscular dysfunction of bladder, unspecified: Secondary | ICD-10-CM | POA: Diagnosis not present

## 2016-10-07 DIAGNOSIS — R319 Hematuria, unspecified: Secondary | ICD-10-CM

## 2016-10-07 DIAGNOSIS — N12 Tubulo-interstitial nephritis, not specified as acute or chronic: Secondary | ICD-10-CM

## 2016-10-07 DIAGNOSIS — E43 Unspecified severe protein-calorie malnutrition: Secondary | ICD-10-CM | POA: Diagnosis not present

## 2016-10-07 DIAGNOSIS — I959 Hypotension, unspecified: Secondary | ICD-10-CM | POA: Diagnosis not present

## 2016-10-07 DIAGNOSIS — I10 Essential (primary) hypertension: Secondary | ICD-10-CM | POA: Diagnosis not present

## 2016-10-07 DIAGNOSIS — Z933 Colostomy status: Secondary | ICD-10-CM

## 2016-10-08 DIAGNOSIS — Z933 Colostomy status: Secondary | ICD-10-CM | POA: Diagnosis not present

## 2016-10-08 DIAGNOSIS — I959 Hypotension, unspecified: Secondary | ICD-10-CM | POA: Diagnosis not present

## 2016-10-08 DIAGNOSIS — N12 Tubulo-interstitial nephritis, not specified as acute or chronic: Secondary | ICD-10-CM | POA: Diagnosis not present

## 2016-10-08 DIAGNOSIS — R319 Hematuria, unspecified: Secondary | ICD-10-CM | POA: Diagnosis not present

## 2016-10-09 DIAGNOSIS — I959 Hypotension, unspecified: Secondary | ICD-10-CM | POA: Diagnosis not present

## 2016-10-09 DIAGNOSIS — I10 Essential (primary) hypertension: Secondary | ICD-10-CM | POA: Diagnosis not present

## 2016-10-09 DIAGNOSIS — E43 Unspecified severe protein-calorie malnutrition: Secondary | ICD-10-CM | POA: Diagnosis not present

## 2016-10-09 DIAGNOSIS — Z933 Colostomy status: Secondary | ICD-10-CM | POA: Diagnosis not present

## 2016-10-09 DIAGNOSIS — R319 Hematuria, unspecified: Secondary | ICD-10-CM | POA: Diagnosis not present

## 2016-10-09 DIAGNOSIS — N319 Neuromuscular dysfunction of bladder, unspecified: Secondary | ICD-10-CM | POA: Diagnosis not present

## 2016-10-09 DIAGNOSIS — N12 Tubulo-interstitial nephritis, not specified as acute or chronic: Secondary | ICD-10-CM | POA: Diagnosis not present

## 2016-10-09 DIAGNOSIS — G8220 Paraplegia, unspecified: Secondary | ICD-10-CM | POA: Diagnosis not present

## 2016-10-10 DIAGNOSIS — I959 Hypotension, unspecified: Secondary | ICD-10-CM | POA: Diagnosis not present

## 2016-10-10 DIAGNOSIS — R319 Hematuria, unspecified: Secondary | ICD-10-CM | POA: Diagnosis not present

## 2016-10-10 DIAGNOSIS — Z933 Colostomy status: Secondary | ICD-10-CM | POA: Diagnosis not present

## 2016-10-10 DIAGNOSIS — N12 Tubulo-interstitial nephritis, not specified as acute or chronic: Secondary | ICD-10-CM | POA: Diagnosis not present

## 2016-10-11 DIAGNOSIS — R319 Hematuria, unspecified: Secondary | ICD-10-CM | POA: Diagnosis not present

## 2016-10-11 DIAGNOSIS — I959 Hypotension, unspecified: Secondary | ICD-10-CM | POA: Diagnosis not present

## 2016-10-11 DIAGNOSIS — Z933 Colostomy status: Secondary | ICD-10-CM | POA: Diagnosis not present

## 2016-10-11 DIAGNOSIS — N12 Tubulo-interstitial nephritis, not specified as acute or chronic: Secondary | ICD-10-CM | POA: Diagnosis not present

## 2016-11-23 DIAGNOSIS — I10 Essential (primary) hypertension: Secondary | ICD-10-CM | POA: Diagnosis not present

## 2016-11-23 DIAGNOSIS — Z9289 Personal history of other medical treatment: Secondary | ICD-10-CM | POA: Diagnosis not present

## 2016-11-23 DIAGNOSIS — G8220 Paraplegia, unspecified: Secondary | ICD-10-CM | POA: Diagnosis not present

## 2016-11-23 DIAGNOSIS — S069X9A Unspecified intracranial injury with loss of consciousness of unspecified duration, initial encounter: Secondary | ICD-10-CM | POA: Diagnosis not present

## 2016-11-23 DIAGNOSIS — N390 Urinary tract infection, site not specified: Secondary | ICD-10-CM | POA: Diagnosis not present

## 2016-11-23 DIAGNOSIS — I959 Hypotension, unspecified: Secondary | ICD-10-CM | POA: Diagnosis not present

## 2016-11-23 DIAGNOSIS — E43 Unspecified severe protein-calorie malnutrition: Secondary | ICD-10-CM | POA: Diagnosis not present

## 2016-11-23 DIAGNOSIS — Z933 Colostomy status: Secondary | ICD-10-CM | POA: Diagnosis not present

## 2016-11-24 DIAGNOSIS — Z933 Colostomy status: Secondary | ICD-10-CM | POA: Diagnosis not present

## 2016-11-24 DIAGNOSIS — N390 Urinary tract infection, site not specified: Secondary | ICD-10-CM | POA: Diagnosis not present

## 2016-11-24 DIAGNOSIS — Z9289 Personal history of other medical treatment: Secondary | ICD-10-CM | POA: Diagnosis not present

## 2016-11-24 DIAGNOSIS — I959 Hypotension, unspecified: Secondary | ICD-10-CM | POA: Diagnosis not present

## 2017-03-28 DIAGNOSIS — G92 Toxic encephalopathy: Secondary | ICD-10-CM | POA: Diagnosis not present

## 2017-03-28 DIAGNOSIS — G8220 Paraplegia, unspecified: Secondary | ICD-10-CM | POA: Diagnosis not present

## 2017-03-28 DIAGNOSIS — N139 Obstructive and reflux uropathy, unspecified: Secondary | ICD-10-CM | POA: Diagnosis not present

## 2017-03-28 DIAGNOSIS — Z9289 Personal history of other medical treatment: Secondary | ICD-10-CM | POA: Diagnosis not present

## 2017-03-28 DIAGNOSIS — N390 Urinary tract infection, site not specified: Secondary | ICD-10-CM

## 2017-03-28 DIAGNOSIS — R4182 Altered mental status, unspecified: Secondary | ICD-10-CM | POA: Diagnosis not present

## 2017-03-29 DIAGNOSIS — Z9289 Personal history of other medical treatment: Secondary | ICD-10-CM | POA: Diagnosis not present

## 2017-03-29 DIAGNOSIS — N390 Urinary tract infection, site not specified: Secondary | ICD-10-CM | POA: Diagnosis not present

## 2017-03-29 DIAGNOSIS — G8220 Paraplegia, unspecified: Secondary | ICD-10-CM | POA: Diagnosis not present

## 2017-03-29 DIAGNOSIS — N139 Obstructive and reflux uropathy, unspecified: Secondary | ICD-10-CM | POA: Diagnosis not present

## 2017-03-29 DIAGNOSIS — G92 Toxic encephalopathy: Secondary | ICD-10-CM | POA: Diagnosis not present

## 2017-03-29 DIAGNOSIS — R4182 Altered mental status, unspecified: Secondary | ICD-10-CM | POA: Diagnosis not present

## 2017-03-30 DIAGNOSIS — R4182 Altered mental status, unspecified: Secondary | ICD-10-CM | POA: Diagnosis not present

## 2017-03-30 DIAGNOSIS — N139 Obstructive and reflux uropathy, unspecified: Secondary | ICD-10-CM | POA: Diagnosis not present

## 2017-03-30 DIAGNOSIS — Z9289 Personal history of other medical treatment: Secondary | ICD-10-CM | POA: Diagnosis not present

## 2017-03-30 DIAGNOSIS — N390 Urinary tract infection, site not specified: Secondary | ICD-10-CM | POA: Diagnosis not present

## 2017-03-31 DIAGNOSIS — N139 Obstructive and reflux uropathy, unspecified: Secondary | ICD-10-CM | POA: Diagnosis not present

## 2017-03-31 DIAGNOSIS — Z9289 Personal history of other medical treatment: Secondary | ICD-10-CM | POA: Diagnosis not present

## 2017-03-31 DIAGNOSIS — R4182 Altered mental status, unspecified: Secondary | ICD-10-CM | POA: Diagnosis not present

## 2017-03-31 DIAGNOSIS — N390 Urinary tract infection, site not specified: Secondary | ICD-10-CM | POA: Diagnosis not present

## 2017-04-01 DIAGNOSIS — R4182 Altered mental status, unspecified: Secondary | ICD-10-CM | POA: Diagnosis not present

## 2017-04-01 DIAGNOSIS — Z9289 Personal history of other medical treatment: Secondary | ICD-10-CM | POA: Diagnosis not present

## 2017-04-01 DIAGNOSIS — N390 Urinary tract infection, site not specified: Secondary | ICD-10-CM | POA: Diagnosis not present

## 2017-04-01 DIAGNOSIS — N139 Obstructive and reflux uropathy, unspecified: Secondary | ICD-10-CM | POA: Diagnosis not present

## 2017-04-02 DIAGNOSIS — R4182 Altered mental status, unspecified: Secondary | ICD-10-CM | POA: Diagnosis not present

## 2017-04-02 DIAGNOSIS — G92 Toxic encephalopathy: Secondary | ICD-10-CM | POA: Diagnosis not present

## 2017-04-02 DIAGNOSIS — N139 Obstructive and reflux uropathy, unspecified: Secondary | ICD-10-CM | POA: Diagnosis not present

## 2017-04-02 DIAGNOSIS — G8220 Paraplegia, unspecified: Secondary | ICD-10-CM | POA: Diagnosis not present

## 2017-04-02 DIAGNOSIS — N390 Urinary tract infection, site not specified: Secondary | ICD-10-CM | POA: Diagnosis not present

## 2017-04-02 DIAGNOSIS — Z9289 Personal history of other medical treatment: Secondary | ICD-10-CM | POA: Diagnosis not present

## 2017-04-18 IMAGING — CR DG CHEST 2V
1 series · 1 of 1 positions shown · non-contrast
Comparison: Portable chest x-ray March 20, 2016 and CT scan
of the chest of March 13, 2016 during placement of a percutaneous
drainage catheter on the right.

CLINICAL DATA: Right lung empyema

EXAM:
CHEST  2 VIEW

[w chest lat]
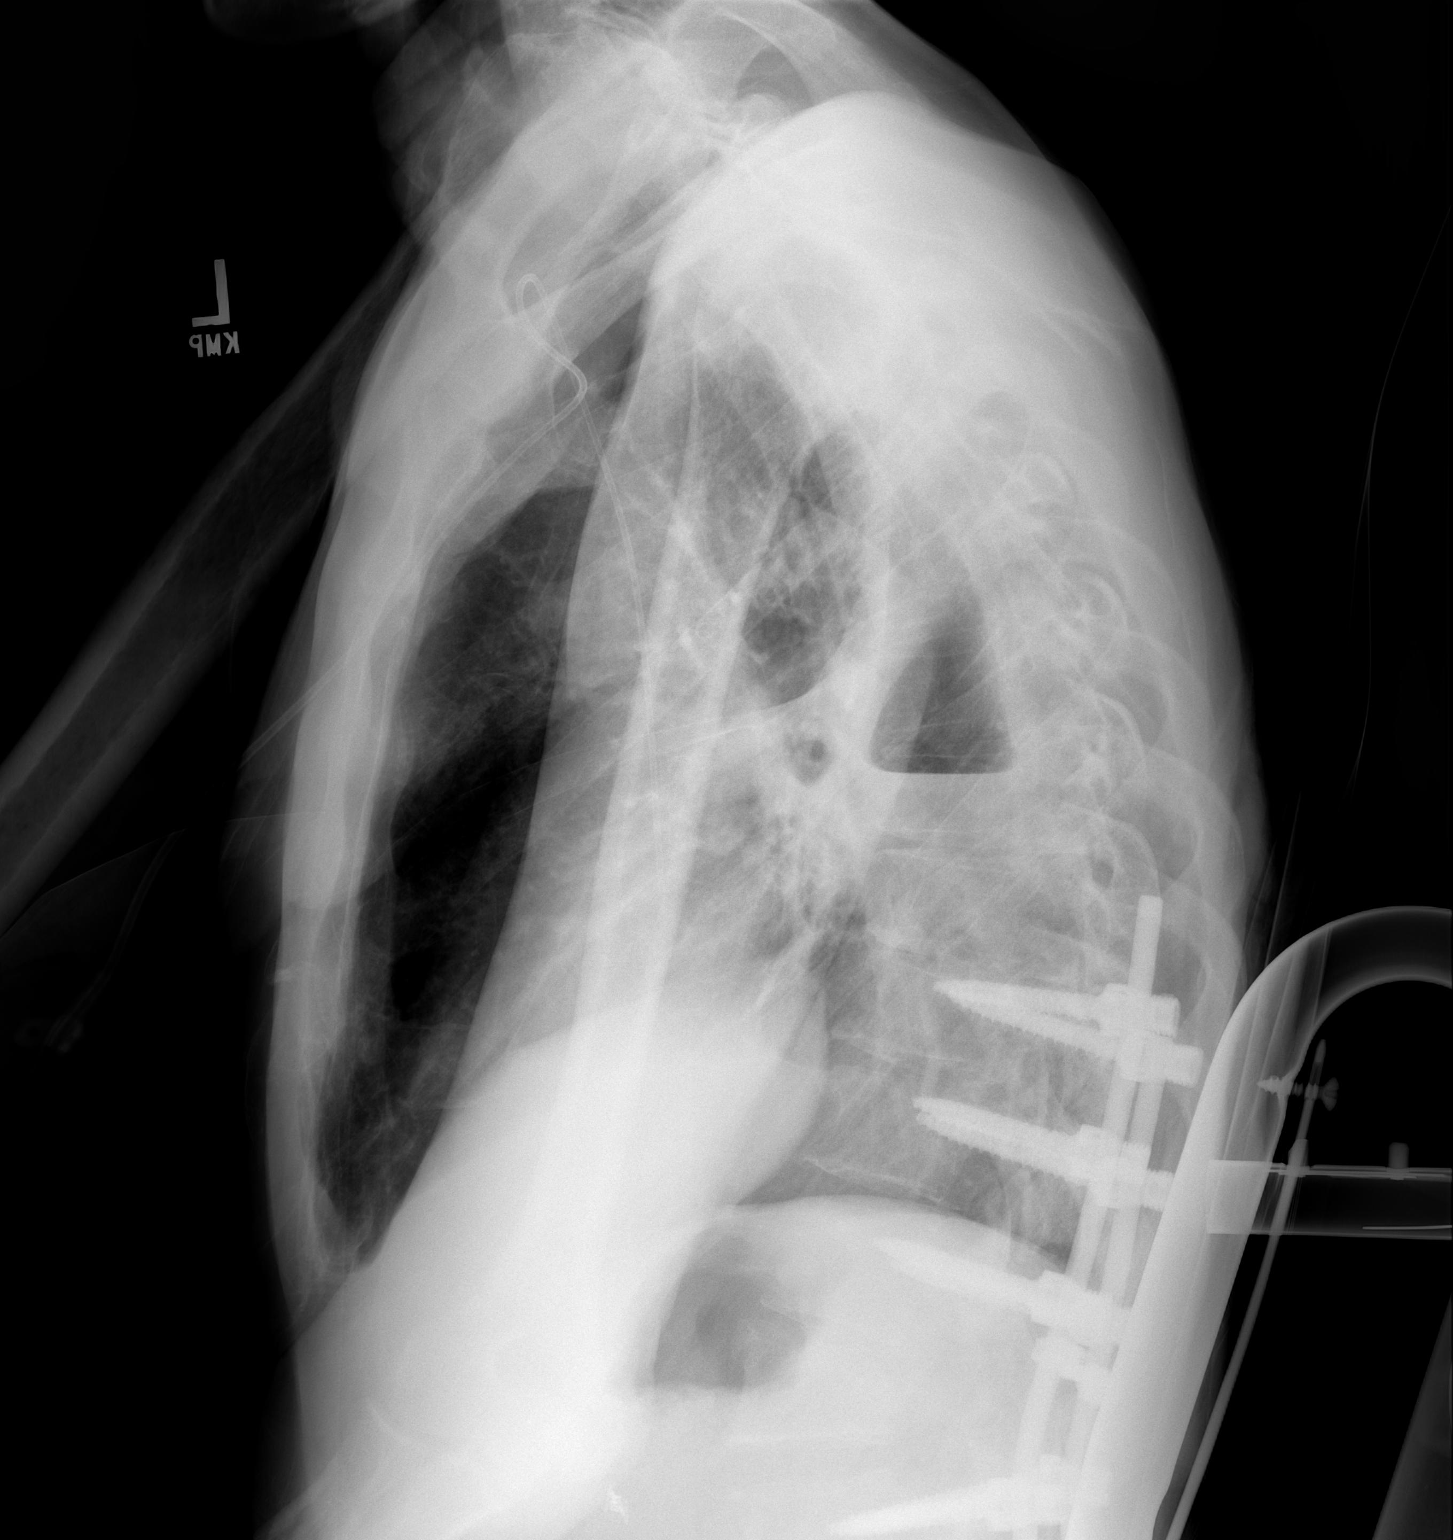

[1 of 1 positions shown; findings below may reference images not displayed]

FINDINGS: There is further volume loss on the right since the [REDACTED]
study. There is an air-fluid level projecting posteriorly in the mid
hemithorax consistent with a known empyema. No definite chest tube
is observed today. The left lung is reasonably well inflated and
clear. The heart is normal in size. The pulmonary vascularity is not
engorged. The PICC line tip projects over the midportion of the SVC.
The patient has undergone previous posterior spinal fusion from the
mid thoracic spine inferiorly. There is old deformity of posterior
right ribs
IMPRESSION: Increasing size of the known right empyema which occupies much of
the mid and lower posterior pleural space volume. No significant
mediastinal shift. The left lung remains clear. No CHF.

Chest CT scanning would be a useful next imaging step.

## 2017-05-15 DIAGNOSIS — N390 Urinary tract infection, site not specified: Secondary | ICD-10-CM

## 2017-05-15 DIAGNOSIS — L309 Dermatitis, unspecified: Secondary | ICD-10-CM | POA: Diagnosis not present

## 2017-05-15 DIAGNOSIS — R197 Diarrhea, unspecified: Secondary | ICD-10-CM

## 2017-05-16 DIAGNOSIS — L309 Dermatitis, unspecified: Secondary | ICD-10-CM | POA: Diagnosis not present

## 2017-05-16 DIAGNOSIS — N390 Urinary tract infection, site not specified: Secondary | ICD-10-CM | POA: Diagnosis not present

## 2017-05-16 DIAGNOSIS — R197 Diarrhea, unspecified: Secondary | ICD-10-CM | POA: Diagnosis not present

## 2017-05-17 DIAGNOSIS — L309 Dermatitis, unspecified: Secondary | ICD-10-CM | POA: Diagnosis not present

## 2017-05-17 DIAGNOSIS — N390 Urinary tract infection, site not specified: Secondary | ICD-10-CM | POA: Diagnosis not present

## 2017-05-17 DIAGNOSIS — R197 Diarrhea, unspecified: Secondary | ICD-10-CM | POA: Diagnosis not present

## 2017-05-18 DIAGNOSIS — N390 Urinary tract infection, site not specified: Secondary | ICD-10-CM | POA: Diagnosis not present

## 2017-05-18 DIAGNOSIS — L309 Dermatitis, unspecified: Secondary | ICD-10-CM | POA: Diagnosis not present

## 2017-05-18 DIAGNOSIS — R197 Diarrhea, unspecified: Secondary | ICD-10-CM | POA: Diagnosis not present

## 2017-05-19 DIAGNOSIS — R197 Diarrhea, unspecified: Secondary | ICD-10-CM | POA: Diagnosis not present

## 2017-05-19 DIAGNOSIS — N390 Urinary tract infection, site not specified: Secondary | ICD-10-CM | POA: Diagnosis not present

## 2017-05-19 DIAGNOSIS — L309 Dermatitis, unspecified: Secondary | ICD-10-CM | POA: Diagnosis not present

## 2017-05-20 DIAGNOSIS — R197 Diarrhea, unspecified: Secondary | ICD-10-CM | POA: Diagnosis not present

## 2017-05-20 DIAGNOSIS — N390 Urinary tract infection, site not specified: Secondary | ICD-10-CM | POA: Diagnosis not present

## 2017-05-20 DIAGNOSIS — L309 Dermatitis, unspecified: Secondary | ICD-10-CM | POA: Diagnosis not present

## 2017-05-21 DIAGNOSIS — N390 Urinary tract infection, site not specified: Secondary | ICD-10-CM | POA: Diagnosis not present

## 2017-05-21 DIAGNOSIS — L309 Dermatitis, unspecified: Secondary | ICD-10-CM | POA: Diagnosis not present

## 2017-05-21 DIAGNOSIS — R197 Diarrhea, unspecified: Secondary | ICD-10-CM | POA: Diagnosis not present

## 2018-01-22 DIAGNOSIS — L89314 Pressure ulcer of right buttock, stage 4: Secondary | ICD-10-CM | POA: Diagnosis not present

## 2018-01-22 DIAGNOSIS — L89324 Pressure ulcer of left buttock, stage 4: Secondary | ICD-10-CM | POA: Diagnosis not present

## 2018-01-22 DIAGNOSIS — L89154 Pressure ulcer of sacral region, stage 4: Secondary | ICD-10-CM | POA: Diagnosis not present

## 2018-01-22 DIAGNOSIS — G8220 Paraplegia, unspecified: Secondary | ICD-10-CM | POA: Diagnosis not present

## 2018-02-03 DIAGNOSIS — I10 Essential (primary) hypertension: Secondary | ICD-10-CM | POA: Diagnosis not present

## 2018-02-03 DIAGNOSIS — G8221 Paraplegia, complete: Secondary | ICD-10-CM | POA: Diagnosis not present

## 2018-02-03 DIAGNOSIS — F419 Anxiety disorder, unspecified: Secondary | ICD-10-CM | POA: Diagnosis not present

## 2018-02-03 DIAGNOSIS — R339 Retention of urine, unspecified: Secondary | ICD-10-CM | POA: Diagnosis not present

## 2018-02-05 DIAGNOSIS — L89154 Pressure ulcer of sacral region, stage 4: Secondary | ICD-10-CM | POA: Diagnosis not present

## 2018-02-05 DIAGNOSIS — L89324 Pressure ulcer of left buttock, stage 4: Secondary | ICD-10-CM | POA: Diagnosis not present

## 2018-02-07 DIAGNOSIS — M150 Primary generalized (osteo)arthritis: Secondary | ICD-10-CM | POA: Diagnosis not present

## 2018-02-07 DIAGNOSIS — F419 Anxiety disorder, unspecified: Secondary | ICD-10-CM | POA: Diagnosis not present

## 2018-02-07 DIAGNOSIS — I10 Essential (primary) hypertension: Secondary | ICD-10-CM | POA: Diagnosis not present

## 2018-02-26 DIAGNOSIS — R339 Retention of urine, unspecified: Secondary | ICD-10-CM | POA: Diagnosis not present

## 2018-02-26 DIAGNOSIS — F419 Anxiety disorder, unspecified: Secondary | ICD-10-CM | POA: Diagnosis not present

## 2018-02-26 DIAGNOSIS — L89154 Pressure ulcer of sacral region, stage 4: Secondary | ICD-10-CM | POA: Diagnosis not present

## 2018-02-26 DIAGNOSIS — L89324 Pressure ulcer of left buttock, stage 4: Secondary | ICD-10-CM | POA: Diagnosis not present

## 2018-02-26 DIAGNOSIS — G8221 Paraplegia, complete: Secondary | ICD-10-CM | POA: Diagnosis not present

## 2018-02-26 DIAGNOSIS — L89314 Pressure ulcer of right buttock, stage 4: Secondary | ICD-10-CM | POA: Diagnosis not present

## 2018-02-26 DIAGNOSIS — I10 Essential (primary) hypertension: Secondary | ICD-10-CM | POA: Diagnosis not present

## 2018-03-03 DIAGNOSIS — N319 Neuromuscular dysfunction of bladder, unspecified: Secondary | ICD-10-CM | POA: Diagnosis not present

## 2018-03-03 DIAGNOSIS — M150 Primary generalized (osteo)arthritis: Secondary | ICD-10-CM | POA: Diagnosis not present

## 2018-03-03 DIAGNOSIS — I10 Essential (primary) hypertension: Secondary | ICD-10-CM | POA: Diagnosis not present

## 2018-03-03 DIAGNOSIS — I4891 Unspecified atrial fibrillation: Secondary | ICD-10-CM | POA: Diagnosis not present

## 2018-03-12 DIAGNOSIS — L89154 Pressure ulcer of sacral region, stage 4: Secondary | ICD-10-CM | POA: Diagnosis not present

## 2018-03-12 DIAGNOSIS — L89324 Pressure ulcer of left buttock, stage 4: Secondary | ICD-10-CM | POA: Diagnosis not present

## 2018-03-12 DIAGNOSIS — L89314 Pressure ulcer of right buttock, stage 4: Secondary | ICD-10-CM | POA: Diagnosis not present

## 2018-03-12 DIAGNOSIS — G8220 Paraplegia, unspecified: Secondary | ICD-10-CM | POA: Diagnosis not present

## 2018-03-24 DIAGNOSIS — F419 Anxiety disorder, unspecified: Secondary | ICD-10-CM | POA: Diagnosis not present

## 2018-03-24 DIAGNOSIS — I10 Essential (primary) hypertension: Secondary | ICD-10-CM | POA: Diagnosis not present

## 2018-03-24 DIAGNOSIS — G8221 Paraplegia, complete: Secondary | ICD-10-CM | POA: Diagnosis not present

## 2018-03-24 DIAGNOSIS — S062X0D Diffuse traumatic brain injury without loss of consciousness, subsequent encounter: Secondary | ICD-10-CM | POA: Diagnosis not present

## 2018-03-25 DIAGNOSIS — Z79899 Other long term (current) drug therapy: Secondary | ICD-10-CM | POA: Diagnosis not present

## 2018-03-25 DIAGNOSIS — N390 Urinary tract infection, site not specified: Secondary | ICD-10-CM | POA: Diagnosis not present

## 2018-03-26 DIAGNOSIS — L89154 Pressure ulcer of sacral region, stage 4: Secondary | ICD-10-CM | POA: Diagnosis not present

## 2018-03-26 DIAGNOSIS — L89314 Pressure ulcer of right buttock, stage 4: Secondary | ICD-10-CM | POA: Diagnosis not present

## 2018-03-26 DIAGNOSIS — L89324 Pressure ulcer of left buttock, stage 4: Secondary | ICD-10-CM | POA: Diagnosis not present

## 2018-03-30 DIAGNOSIS — E559 Vitamin D deficiency, unspecified: Secondary | ICD-10-CM | POA: Diagnosis not present

## 2018-03-30 DIAGNOSIS — Z79899 Other long term (current) drug therapy: Secondary | ICD-10-CM | POA: Diagnosis not present

## 2018-04-03 DIAGNOSIS — K56600 Partial intestinal obstruction, unspecified as to cause: Secondary | ICD-10-CM | POA: Diagnosis not present

## 2018-04-03 DIAGNOSIS — I4891 Unspecified atrial fibrillation: Secondary | ICD-10-CM | POA: Diagnosis not present

## 2018-04-03 DIAGNOSIS — R404 Transient alteration of awareness: Secondary | ICD-10-CM | POA: Diagnosis not present

## 2018-04-03 DIAGNOSIS — I10 Essential (primary) hypertension: Secondary | ICD-10-CM | POA: Diagnosis not present

## 2018-04-03 DIAGNOSIS — N319 Neuromuscular dysfunction of bladder, unspecified: Secondary | ICD-10-CM

## 2018-04-03 DIAGNOSIS — K9409 Other complications of colostomy: Secondary | ICD-10-CM | POA: Diagnosis not present

## 2018-04-03 DIAGNOSIS — K469 Unspecified abdominal hernia without obstruction or gangrene: Secondary | ICD-10-CM | POA: Diagnosis not present

## 2018-04-03 DIAGNOSIS — S069X9A Unspecified intracranial injury with loss of consciousness of unspecified duration, initial encounter: Secondary | ICD-10-CM | POA: Diagnosis not present

## 2018-04-03 DIAGNOSIS — G8220 Paraplegia, unspecified: Secondary | ICD-10-CM | POA: Diagnosis not present

## 2018-04-04 DIAGNOSIS — K435 Parastomal hernia without obstruction or  gangrene: Secondary | ICD-10-CM | POA: Diagnosis present

## 2018-04-04 DIAGNOSIS — I959 Hypotension, unspecified: Secondary | ICD-10-CM | POA: Diagnosis present

## 2018-04-04 DIAGNOSIS — Z743 Need for continuous supervision: Secondary | ICD-10-CM | POA: Diagnosis not present

## 2018-04-04 DIAGNOSIS — L8990 Pressure ulcer of unspecified site, unspecified stage: Secondary | ICD-10-CM | POA: Diagnosis not present

## 2018-04-04 DIAGNOSIS — Z433 Encounter for attention to colostomy: Secondary | ICD-10-CM | POA: Diagnosis not present

## 2018-04-04 DIAGNOSIS — L89153 Pressure ulcer of sacral region, stage 3: Secondary | ICD-10-CM | POA: Diagnosis not present

## 2018-04-04 DIAGNOSIS — Z452 Encounter for adjustment and management of vascular access device: Secondary | ICD-10-CM | POA: Diagnosis not present

## 2018-04-04 DIAGNOSIS — A0472 Enterocolitis due to Clostridium difficile, not specified as recurrent: Secondary | ICD-10-CM | POA: Diagnosis not present

## 2018-04-04 DIAGNOSIS — N319 Neuromuscular dysfunction of bladder, unspecified: Secondary | ICD-10-CM | POA: Diagnosis present

## 2018-04-04 DIAGNOSIS — S069X9S Unspecified intracranial injury with loss of consciousness of unspecified duration, sequela: Secondary | ICD-10-CM | POA: Diagnosis not present

## 2018-04-04 DIAGNOSIS — I10 Essential (primary) hypertension: Secondary | ICD-10-CM | POA: Diagnosis present

## 2018-04-04 DIAGNOSIS — Z0000 Encounter for general adult medical examination without abnormal findings: Secondary | ICD-10-CM | POA: Diagnosis not present

## 2018-04-04 DIAGNOSIS — R410 Disorientation, unspecified: Secondary | ICD-10-CM | POA: Diagnosis not present

## 2018-04-04 DIAGNOSIS — Z79899 Other long term (current) drug therapy: Secondary | ICD-10-CM | POA: Diagnosis not present

## 2018-04-04 DIAGNOSIS — M150 Primary generalized (osteo)arthritis: Secondary | ICD-10-CM | POA: Diagnosis not present

## 2018-04-04 DIAGNOSIS — A4189 Other specified sepsis: Secondary | ICD-10-CM | POA: Diagnosis not present

## 2018-04-04 DIAGNOSIS — L89214 Pressure ulcer of right hip, stage 4: Secondary | ICD-10-CM | POA: Diagnosis present

## 2018-04-04 DIAGNOSIS — K56600 Partial intestinal obstruction, unspecified as to cause: Secondary | ICD-10-CM | POA: Diagnosis not present

## 2018-04-04 DIAGNOSIS — I480 Paroxysmal atrial fibrillation: Secondary | ICD-10-CM | POA: Diagnosis present

## 2018-04-04 DIAGNOSIS — F17210 Nicotine dependence, cigarettes, uncomplicated: Secondary | ICD-10-CM | POA: Diagnosis present

## 2018-04-04 DIAGNOSIS — R0902 Hypoxemia: Secondary | ICD-10-CM | POA: Diagnosis not present

## 2018-04-04 DIAGNOSIS — K6389 Other specified diseases of intestine: Secondary | ICD-10-CM | POA: Diagnosis not present

## 2018-04-04 DIAGNOSIS — K433 Parastomal hernia with obstruction, without gangrene: Secondary | ICD-10-CM | POA: Diagnosis not present

## 2018-04-04 DIAGNOSIS — K5669 Other partial intestinal obstruction: Secondary | ICD-10-CM | POA: Diagnosis not present

## 2018-04-04 DIAGNOSIS — R197 Diarrhea, unspecified: Secondary | ICD-10-CM | POA: Diagnosis not present

## 2018-04-04 DIAGNOSIS — F329 Major depressive disorder, single episode, unspecified: Secondary | ICD-10-CM | POA: Diagnosis present

## 2018-04-04 DIAGNOSIS — E876 Hypokalemia: Secondary | ICD-10-CM | POA: Diagnosis not present

## 2018-04-04 DIAGNOSIS — G8220 Paraplegia, unspecified: Secondary | ICD-10-CM | POA: Diagnosis present

## 2018-04-04 DIAGNOSIS — K469 Unspecified abdominal hernia without obstruction or gangrene: Secondary | ICD-10-CM | POA: Diagnosis not present

## 2018-04-04 DIAGNOSIS — R509 Fever, unspecified: Secondary | ICD-10-CM | POA: Diagnosis not present

## 2018-04-04 DIAGNOSIS — A419 Sepsis, unspecified organism: Secondary | ICD-10-CM | POA: Diagnosis not present

## 2018-04-04 DIAGNOSIS — K9409 Other complications of colostomy: Secondary | ICD-10-CM | POA: Diagnosis not present

## 2018-04-04 DIAGNOSIS — L89222 Pressure ulcer of left hip, stage 2: Secondary | ICD-10-CM | POA: Diagnosis present

## 2018-04-05 DIAGNOSIS — L8990 Pressure ulcer of unspecified site, unspecified stage: Secondary | ICD-10-CM | POA: Diagnosis not present

## 2018-04-05 DIAGNOSIS — M150 Primary generalized (osteo)arthritis: Secondary | ICD-10-CM | POA: Diagnosis not present

## 2018-04-05 DIAGNOSIS — I480 Paroxysmal atrial fibrillation: Secondary | ICD-10-CM | POA: Diagnosis not present

## 2018-04-05 DIAGNOSIS — N319 Neuromuscular dysfunction of bladder, unspecified: Secondary | ICD-10-CM | POA: Diagnosis not present

## 2018-04-05 DIAGNOSIS — Z0000 Encounter for general adult medical examination without abnormal findings: Secondary | ICD-10-CM | POA: Diagnosis not present

## 2018-04-05 DIAGNOSIS — A0472 Enterocolitis due to Clostridium difficile, not specified as recurrent: Secondary | ICD-10-CM | POA: Diagnosis not present

## 2018-04-05 DIAGNOSIS — I10 Essential (primary) hypertension: Secondary | ICD-10-CM | POA: Diagnosis not present

## 2018-04-05 DIAGNOSIS — F329 Major depressive disorder, single episode, unspecified: Secondary | ICD-10-CM | POA: Diagnosis not present

## 2018-04-06 DIAGNOSIS — A0472 Enterocolitis due to Clostridium difficile, not specified as recurrent: Secondary | ICD-10-CM | POA: Diagnosis not present

## 2018-04-06 DIAGNOSIS — I480 Paroxysmal atrial fibrillation: Secondary | ICD-10-CM | POA: Diagnosis not present

## 2018-04-06 DIAGNOSIS — L8990 Pressure ulcer of unspecified site, unspecified stage: Secondary | ICD-10-CM | POA: Diagnosis not present

## 2018-04-06 DIAGNOSIS — F329 Major depressive disorder, single episode, unspecified: Secondary | ICD-10-CM | POA: Diagnosis not present

## 2018-04-07 DIAGNOSIS — I480 Paroxysmal atrial fibrillation: Secondary | ICD-10-CM | POA: Diagnosis not present

## 2018-04-07 DIAGNOSIS — L8990 Pressure ulcer of unspecified site, unspecified stage: Secondary | ICD-10-CM | POA: Diagnosis not present

## 2018-04-07 DIAGNOSIS — A0472 Enterocolitis due to Clostridium difficile, not specified as recurrent: Secondary | ICD-10-CM | POA: Diagnosis not present

## 2018-04-07 DIAGNOSIS — F329 Major depressive disorder, single episode, unspecified: Secondary | ICD-10-CM | POA: Diagnosis not present

## 2018-04-08 DIAGNOSIS — A0472 Enterocolitis due to Clostridium difficile, not specified as recurrent: Secondary | ICD-10-CM | POA: Diagnosis not present

## 2018-04-08 DIAGNOSIS — F329 Major depressive disorder, single episode, unspecified: Secondary | ICD-10-CM | POA: Diagnosis not present

## 2018-04-08 DIAGNOSIS — L8990 Pressure ulcer of unspecified site, unspecified stage: Secondary | ICD-10-CM | POA: Diagnosis not present

## 2018-04-08 DIAGNOSIS — I480 Paroxysmal atrial fibrillation: Secondary | ICD-10-CM | POA: Diagnosis not present

## 2018-04-14 DIAGNOSIS — A419 Sepsis, unspecified organism: Secondary | ICD-10-CM | POA: Diagnosis not present

## 2018-04-14 DIAGNOSIS — A0472 Enterocolitis due to Clostridium difficile, not specified as recurrent: Secondary | ICD-10-CM | POA: Diagnosis not present

## 2018-04-14 DIAGNOSIS — G8221 Paraplegia, complete: Secondary | ICD-10-CM | POA: Diagnosis not present

## 2018-04-14 DIAGNOSIS — R339 Retention of urine, unspecified: Secondary | ICD-10-CM | POA: Diagnosis not present

## 2018-04-16 DIAGNOSIS — Z79899 Other long term (current) drug therapy: Secondary | ICD-10-CM | POA: Diagnosis not present

## 2018-04-21 DIAGNOSIS — L89324 Pressure ulcer of left buttock, stage 4: Secondary | ICD-10-CM | POA: Diagnosis not present

## 2018-04-21 DIAGNOSIS — G8221 Paraplegia, complete: Secondary | ICD-10-CM | POA: Diagnosis not present

## 2018-04-21 DIAGNOSIS — L89154 Pressure ulcer of sacral region, stage 4: Secondary | ICD-10-CM | POA: Diagnosis not present

## 2018-04-21 DIAGNOSIS — L89314 Pressure ulcer of right buttock, stage 4: Secondary | ICD-10-CM | POA: Diagnosis not present

## 2018-04-21 DIAGNOSIS — I10 Essential (primary) hypertension: Secondary | ICD-10-CM | POA: Diagnosis not present

## 2018-04-21 DIAGNOSIS — F419 Anxiety disorder, unspecified: Secondary | ICD-10-CM | POA: Diagnosis not present

## 2018-04-21 DIAGNOSIS — A0472 Enterocolitis due to Clostridium difficile, not specified as recurrent: Secondary | ICD-10-CM | POA: Diagnosis not present

## 2018-04-22 DIAGNOSIS — D649 Anemia, unspecified: Secondary | ICD-10-CM | POA: Diagnosis not present

## 2018-04-22 DIAGNOSIS — Z79899 Other long term (current) drug therapy: Secondary | ICD-10-CM | POA: Diagnosis not present

## 2018-04-28 DIAGNOSIS — I10 Essential (primary) hypertension: Secondary | ICD-10-CM | POA: Diagnosis not present

## 2018-04-28 DIAGNOSIS — I480 Paroxysmal atrial fibrillation: Secondary | ICD-10-CM | POA: Diagnosis not present

## 2018-04-28 DIAGNOSIS — R339 Retention of urine, unspecified: Secondary | ICD-10-CM | POA: Diagnosis not present

## 2018-04-28 DIAGNOSIS — M150 Primary generalized (osteo)arthritis: Secondary | ICD-10-CM | POA: Diagnosis not present

## 2018-04-29 DIAGNOSIS — N319 Neuromuscular dysfunction of bladder, unspecified: Secondary | ICD-10-CM | POA: Diagnosis not present

## 2018-04-29 DIAGNOSIS — N390 Urinary tract infection, site not specified: Secondary | ICD-10-CM | POA: Diagnosis not present

## 2018-04-29 DIAGNOSIS — G8220 Paraplegia, unspecified: Secondary | ICD-10-CM | POA: Diagnosis not present

## 2018-04-29 DIAGNOSIS — Z960 Presence of urogenital implants: Secondary | ICD-10-CM | POA: Diagnosis not present

## 2018-05-05 DIAGNOSIS — L89214 Pressure ulcer of right hip, stage 4: Secondary | ICD-10-CM | POA: Diagnosis not present

## 2018-05-05 DIAGNOSIS — L89224 Pressure ulcer of left hip, stage 4: Secondary | ICD-10-CM | POA: Diagnosis not present

## 2018-05-05 DIAGNOSIS — L89314 Pressure ulcer of right buttock, stage 4: Secondary | ICD-10-CM | POA: Diagnosis not present

## 2018-05-05 DIAGNOSIS — L89154 Pressure ulcer of sacral region, stage 4: Secondary | ICD-10-CM | POA: Diagnosis not present

## 2018-05-05 DIAGNOSIS — L89324 Pressure ulcer of left buttock, stage 4: Secondary | ICD-10-CM | POA: Diagnosis not present

## 2018-05-12 DIAGNOSIS — Z043 Encounter for examination and observation following other accident: Secondary | ICD-10-CM | POA: Diagnosis not present

## 2018-05-14 DIAGNOSIS — G8221 Paraplegia, complete: Secondary | ICD-10-CM | POA: Diagnosis not present

## 2018-05-14 DIAGNOSIS — I10 Essential (primary) hypertension: Secondary | ICD-10-CM | POA: Diagnosis not present

## 2018-05-14 DIAGNOSIS — F419 Anxiety disorder, unspecified: Secondary | ICD-10-CM | POA: Diagnosis not present

## 2018-05-14 DIAGNOSIS — K56600 Partial intestinal obstruction, unspecified as to cause: Secondary | ICD-10-CM | POA: Diagnosis not present

## 2018-05-19 DIAGNOSIS — N210 Calculus in bladder: Secondary | ICD-10-CM | POA: Diagnosis not present

## 2018-05-19 DIAGNOSIS — N200 Calculus of kidney: Secondary | ICD-10-CM | POA: Diagnosis not present

## 2018-05-19 DIAGNOSIS — N319 Neuromuscular dysfunction of bladder, unspecified: Secondary | ICD-10-CM | POA: Diagnosis not present

## 2018-05-21 DIAGNOSIS — G8220 Paraplegia, unspecified: Secondary | ICD-10-CM | POA: Diagnosis not present

## 2018-05-21 DIAGNOSIS — L89224 Pressure ulcer of left hip, stage 4: Secondary | ICD-10-CM | POA: Diagnosis not present

## 2018-05-21 DIAGNOSIS — L89214 Pressure ulcer of right hip, stage 4: Secondary | ICD-10-CM | POA: Diagnosis not present

## 2018-05-21 DIAGNOSIS — L89324 Pressure ulcer of left buttock, stage 4: Secondary | ICD-10-CM | POA: Diagnosis not present

## 2018-05-21 DIAGNOSIS — L89314 Pressure ulcer of right buttock, stage 4: Secondary | ICD-10-CM | POA: Diagnosis not present

## 2018-05-21 DIAGNOSIS — L89154 Pressure ulcer of sacral region, stage 4: Secondary | ICD-10-CM | POA: Diagnosis not present

## 2018-05-21 DIAGNOSIS — I96 Gangrene, not elsewhere classified: Secondary | ICD-10-CM | POA: Diagnosis not present

## 2018-05-24 DIAGNOSIS — M150 Primary generalized (osteo)arthritis: Secondary | ICD-10-CM | POA: Diagnosis not present

## 2018-05-24 DIAGNOSIS — I480 Paroxysmal atrial fibrillation: Secondary | ICD-10-CM | POA: Diagnosis not present

## 2018-05-24 DIAGNOSIS — K56600 Partial intestinal obstruction, unspecified as to cause: Secondary | ICD-10-CM | POA: Diagnosis not present

## 2018-05-24 DIAGNOSIS — I10 Essential (primary) hypertension: Secondary | ICD-10-CM | POA: Diagnosis not present

## 2018-06-04 DIAGNOSIS — I96 Gangrene, not elsewhere classified: Secondary | ICD-10-CM | POA: Diagnosis not present

## 2018-06-04 DIAGNOSIS — L89314 Pressure ulcer of right buttock, stage 4: Secondary | ICD-10-CM | POA: Diagnosis not present

## 2018-06-04 DIAGNOSIS — L89324 Pressure ulcer of left buttock, stage 4: Secondary | ICD-10-CM | POA: Diagnosis not present

## 2018-06-04 DIAGNOSIS — L89154 Pressure ulcer of sacral region, stage 4: Secondary | ICD-10-CM | POA: Diagnosis not present

## 2018-06-22 DIAGNOSIS — L89324 Pressure ulcer of left buttock, stage 4: Secondary | ICD-10-CM | POA: Diagnosis not present

## 2018-06-22 DIAGNOSIS — I96 Gangrene, not elsewhere classified: Secondary | ICD-10-CM | POA: Diagnosis not present

## 2018-06-22 DIAGNOSIS — L89154 Pressure ulcer of sacral region, stage 4: Secondary | ICD-10-CM | POA: Diagnosis not present

## 2018-06-22 DIAGNOSIS — L89314 Pressure ulcer of right buttock, stage 4: Secondary | ICD-10-CM | POA: Diagnosis not present

## 2018-06-23 DIAGNOSIS — G8221 Paraplegia, complete: Secondary | ICD-10-CM | POA: Diagnosis not present

## 2018-06-23 DIAGNOSIS — R339 Retention of urine, unspecified: Secondary | ICD-10-CM | POA: Diagnosis not present

## 2018-06-23 DIAGNOSIS — F419 Anxiety disorder, unspecified: Secondary | ICD-10-CM | POA: Diagnosis not present

## 2018-06-23 DIAGNOSIS — S062X0D Diffuse traumatic brain injury without loss of consciousness, subsequent encounter: Secondary | ICD-10-CM | POA: Diagnosis not present

## 2018-06-25 DIAGNOSIS — F411 Generalized anxiety disorder: Secondary | ICD-10-CM | POA: Diagnosis not present

## 2018-06-25 DIAGNOSIS — F320 Major depressive disorder, single episode, mild: Secondary | ICD-10-CM | POA: Diagnosis not present

## 2018-06-25 DIAGNOSIS — F0280 Dementia in other diseases classified elsewhere without behavioral disturbance: Secondary | ICD-10-CM | POA: Diagnosis not present

## 2018-06-28 DIAGNOSIS — R339 Retention of urine, unspecified: Secondary | ICD-10-CM | POA: Diagnosis not present

## 2018-06-28 DIAGNOSIS — I480 Paroxysmal atrial fibrillation: Secondary | ICD-10-CM | POA: Diagnosis not present

## 2018-06-28 DIAGNOSIS — I10 Essential (primary) hypertension: Secondary | ICD-10-CM | POA: Diagnosis not present

## 2018-06-28 DIAGNOSIS — N319 Neuromuscular dysfunction of bladder, unspecified: Secondary | ICD-10-CM | POA: Diagnosis not present

## 2018-06-29 DIAGNOSIS — E119 Type 2 diabetes mellitus without complications: Secondary | ICD-10-CM | POA: Diagnosis not present

## 2018-06-29 DIAGNOSIS — Z79899 Other long term (current) drug therapy: Secondary | ICD-10-CM | POA: Diagnosis not present

## 2018-06-29 DIAGNOSIS — E559 Vitamin D deficiency, unspecified: Secondary | ICD-10-CM | POA: Diagnosis not present

## 2018-06-29 DIAGNOSIS — E039 Hypothyroidism, unspecified: Secondary | ICD-10-CM | POA: Diagnosis not present

## 2018-06-29 DIAGNOSIS — E785 Hyperlipidemia, unspecified: Secondary | ICD-10-CM | POA: Diagnosis not present

## 2018-07-13 DIAGNOSIS — F320 Major depressive disorder, single episode, mild: Secondary | ICD-10-CM | POA: Diagnosis not present

## 2018-07-13 DIAGNOSIS — F411 Generalized anxiety disorder: Secondary | ICD-10-CM | POA: Diagnosis not present

## 2018-07-13 DIAGNOSIS — R4189 Other symptoms and signs involving cognitive functions and awareness: Secondary | ICD-10-CM | POA: Diagnosis not present

## 2018-08-05 DIAGNOSIS — R4189 Other symptoms and signs involving cognitive functions and awareness: Secondary | ICD-10-CM | POA: Diagnosis not present

## 2018-08-05 DIAGNOSIS — F320 Major depressive disorder, single episode, mild: Secondary | ICD-10-CM | POA: Diagnosis not present

## 2018-08-05 DIAGNOSIS — F411 Generalized anxiety disorder: Secondary | ICD-10-CM | POA: Diagnosis not present

## 2018-08-07 DIAGNOSIS — F419 Anxiety disorder, unspecified: Secondary | ICD-10-CM | POA: Diagnosis not present

## 2018-08-07 DIAGNOSIS — F331 Major depressive disorder, recurrent, moderate: Secondary | ICD-10-CM | POA: Diagnosis not present

## 2018-08-07 DIAGNOSIS — G4709 Other insomnia: Secondary | ICD-10-CM | POA: Diagnosis not present

## 2018-08-09 DIAGNOSIS — I4891 Unspecified atrial fibrillation: Secondary | ICD-10-CM | POA: Diagnosis not present

## 2018-08-09 DIAGNOSIS — M150 Primary generalized (osteo)arthritis: Secondary | ICD-10-CM | POA: Diagnosis not present

## 2018-08-09 DIAGNOSIS — N319 Neuromuscular dysfunction of bladder, unspecified: Secondary | ICD-10-CM | POA: Diagnosis not present

## 2018-08-09 DIAGNOSIS — I10 Essential (primary) hypertension: Secondary | ICD-10-CM | POA: Diagnosis not present

## 2018-08-20 DIAGNOSIS — F329 Major depressive disorder, single episode, unspecified: Secondary | ICD-10-CM | POA: Diagnosis not present

## 2018-08-20 DIAGNOSIS — N319 Neuromuscular dysfunction of bladder, unspecified: Secondary | ICD-10-CM | POA: Diagnosis not present

## 2018-08-20 DIAGNOSIS — R339 Retention of urine, unspecified: Secondary | ICD-10-CM | POA: Diagnosis not present

## 2018-08-20 DIAGNOSIS — F419 Anxiety disorder, unspecified: Secondary | ICD-10-CM | POA: Diagnosis not present

## 2018-08-25 DIAGNOSIS — M79631 Pain in right forearm: Secondary | ICD-10-CM | POA: Diagnosis not present

## 2018-08-25 DIAGNOSIS — M79601 Pain in right arm: Secondary | ICD-10-CM | POA: Diagnosis not present

## 2018-08-25 DIAGNOSIS — M19041 Primary osteoarthritis, right hand: Secondary | ICD-10-CM | POA: Diagnosis not present

## 2018-08-25 DIAGNOSIS — M19021 Primary osteoarthritis, right elbow: Secondary | ICD-10-CM | POA: Diagnosis not present

## 2018-08-25 DIAGNOSIS — M19031 Primary osteoarthritis, right wrist: Secondary | ICD-10-CM | POA: Diagnosis not present

## 2018-09-02 DIAGNOSIS — N319 Neuromuscular dysfunction of bladder, unspecified: Secondary | ICD-10-CM | POA: Diagnosis not present

## 2018-09-02 DIAGNOSIS — Z960 Presence of urogenital implants: Secondary | ICD-10-CM | POA: Diagnosis not present

## 2018-09-02 DIAGNOSIS — N390 Urinary tract infection, site not specified: Secondary | ICD-10-CM | POA: Diagnosis not present

## 2018-09-05 DIAGNOSIS — M150 Primary generalized (osteo)arthritis: Secondary | ICD-10-CM | POA: Diagnosis not present

## 2018-09-05 DIAGNOSIS — N319 Neuromuscular dysfunction of bladder, unspecified: Secondary | ICD-10-CM | POA: Diagnosis not present

## 2018-09-05 DIAGNOSIS — I10 Essential (primary) hypertension: Secondary | ICD-10-CM | POA: Diagnosis not present

## 2018-09-05 DIAGNOSIS — N200 Calculus of kidney: Secondary | ICD-10-CM | POA: Diagnosis not present

## 2018-09-09 DIAGNOSIS — R319 Hematuria, unspecified: Secondary | ICD-10-CM | POA: Diagnosis not present

## 2018-09-09 DIAGNOSIS — N390 Urinary tract infection, site not specified: Secondary | ICD-10-CM | POA: Diagnosis not present

## 2018-09-09 DIAGNOSIS — Z79899 Other long term (current) drug therapy: Secondary | ICD-10-CM | POA: Diagnosis not present

## 2018-09-10 DIAGNOSIS — F331 Major depressive disorder, recurrent, moderate: Secondary | ICD-10-CM | POA: Diagnosis not present

## 2018-09-10 DIAGNOSIS — F419 Anxiety disorder, unspecified: Secondary | ICD-10-CM | POA: Diagnosis not present

## 2018-09-10 DIAGNOSIS — G4709 Other insomnia: Secondary | ICD-10-CM | POA: Diagnosis not present

## 2018-09-16 DIAGNOSIS — F411 Generalized anxiety disorder: Secondary | ICD-10-CM | POA: Diagnosis not present

## 2018-09-16 DIAGNOSIS — F320 Major depressive disorder, single episode, mild: Secondary | ICD-10-CM | POA: Diagnosis not present

## 2018-09-16 DIAGNOSIS — R4189 Other symptoms and signs involving cognitive functions and awareness: Secondary | ICD-10-CM | POA: Diagnosis not present

## 2018-09-17 DIAGNOSIS — N319 Neuromuscular dysfunction of bladder, unspecified: Secondary | ICD-10-CM | POA: Diagnosis not present

## 2018-09-17 DIAGNOSIS — R339 Retention of urine, unspecified: Secondary | ICD-10-CM | POA: Diagnosis not present

## 2018-09-17 DIAGNOSIS — G8221 Paraplegia, complete: Secondary | ICD-10-CM | POA: Diagnosis not present

## 2018-09-17 DIAGNOSIS — N390 Urinary tract infection, site not specified: Secondary | ICD-10-CM | POA: Diagnosis not present

## 2018-09-21 DIAGNOSIS — Z03818 Encounter for observation for suspected exposure to other biological agents ruled out: Secondary | ICD-10-CM | POA: Diagnosis not present

## 2018-09-28 DIAGNOSIS — D649 Anemia, unspecified: Secondary | ICD-10-CM | POA: Diagnosis not present

## 2018-09-28 DIAGNOSIS — Z79899 Other long term (current) drug therapy: Secondary | ICD-10-CM | POA: Diagnosis not present

## 2018-09-28 DIAGNOSIS — E559 Vitamin D deficiency, unspecified: Secondary | ICD-10-CM | POA: Diagnosis not present

## 2018-09-28 DIAGNOSIS — D519 Vitamin B12 deficiency anemia, unspecified: Secondary | ICD-10-CM | POA: Diagnosis not present

## 2018-09-28 DIAGNOSIS — Z03818 Encounter for observation for suspected exposure to other biological agents ruled out: Secondary | ICD-10-CM | POA: Diagnosis not present

## 2018-09-28 DIAGNOSIS — E785 Hyperlipidemia, unspecified: Secondary | ICD-10-CM | POA: Diagnosis not present

## 2018-09-28 DIAGNOSIS — E039 Hypothyroidism, unspecified: Secondary | ICD-10-CM | POA: Diagnosis not present

## 2018-10-03 DIAGNOSIS — N319 Neuromuscular dysfunction of bladder, unspecified: Secondary | ICD-10-CM | POA: Diagnosis not present

## 2018-10-03 DIAGNOSIS — R339 Retention of urine, unspecified: Secondary | ICD-10-CM | POA: Diagnosis not present

## 2018-10-03 DIAGNOSIS — I10 Essential (primary) hypertension: Secondary | ICD-10-CM | POA: Diagnosis not present

## 2018-10-03 DIAGNOSIS — I4891 Unspecified atrial fibrillation: Secondary | ICD-10-CM | POA: Diagnosis not present

## 2018-10-13 DIAGNOSIS — Z03818 Encounter for observation for suspected exposure to other biological agents ruled out: Secondary | ICD-10-CM | POA: Diagnosis not present

## 2018-10-14 DIAGNOSIS — G8221 Paraplegia, complete: Secondary | ICD-10-CM | POA: Diagnosis not present

## 2018-10-14 DIAGNOSIS — F329 Major depressive disorder, single episode, unspecified: Secondary | ICD-10-CM | POA: Diagnosis not present

## 2018-10-14 DIAGNOSIS — R339 Retention of urine, unspecified: Secondary | ICD-10-CM | POA: Diagnosis not present

## 2018-10-14 DIAGNOSIS — N319 Neuromuscular dysfunction of bladder, unspecified: Secondary | ICD-10-CM | POA: Diagnosis not present

## 2018-10-15 DIAGNOSIS — I10 Essential (primary) hypertension: Secondary | ICD-10-CM | POA: Diagnosis not present

## 2018-10-15 DIAGNOSIS — F332 Major depressive disorder, recurrent severe without psychotic features: Secondary | ICD-10-CM | POA: Diagnosis not present

## 2018-10-19 DIAGNOSIS — Z03818 Encounter for observation for suspected exposure to other biological agents ruled out: Secondary | ICD-10-CM | POA: Diagnosis not present

## 2018-10-26 DIAGNOSIS — Z03818 Encounter for observation for suspected exposure to other biological agents ruled out: Secondary | ICD-10-CM | POA: Diagnosis not present

## 2018-10-28 DIAGNOSIS — Z03818 Encounter for observation for suspected exposure to other biological agents ruled out: Secondary | ICD-10-CM | POA: Diagnosis not present

## 2018-11-07 DIAGNOSIS — E876 Hypokalemia: Secondary | ICD-10-CM | POA: Diagnosis not present

## 2018-11-07 DIAGNOSIS — N319 Neuromuscular dysfunction of bladder, unspecified: Secondary | ICD-10-CM | POA: Diagnosis not present

## 2018-11-07 DIAGNOSIS — N200 Calculus of kidney: Secondary | ICD-10-CM | POA: Diagnosis not present

## 2018-11-10 DIAGNOSIS — F332 Major depressive disorder, recurrent severe without psychotic features: Secondary | ICD-10-CM | POA: Diagnosis not present

## 2018-11-10 DIAGNOSIS — I10 Essential (primary) hypertension: Secondary | ICD-10-CM | POA: Diagnosis not present

## 2018-11-12 DIAGNOSIS — R339 Retention of urine, unspecified: Secondary | ICD-10-CM | POA: Diagnosis not present

## 2018-11-12 DIAGNOSIS — F329 Major depressive disorder, single episode, unspecified: Secondary | ICD-10-CM | POA: Diagnosis not present

## 2018-11-12 DIAGNOSIS — G8221 Paraplegia, complete: Secondary | ICD-10-CM | POA: Diagnosis not present

## 2018-11-12 DIAGNOSIS — N319 Neuromuscular dysfunction of bladder, unspecified: Secondary | ICD-10-CM | POA: Diagnosis not present

## 2018-11-14 DIAGNOSIS — R319 Hematuria, unspecified: Secondary | ICD-10-CM | POA: Diagnosis not present

## 2018-11-14 DIAGNOSIS — Z79899 Other long term (current) drug therapy: Secondary | ICD-10-CM | POA: Diagnosis not present

## 2018-11-14 DIAGNOSIS — N390 Urinary tract infection, site not specified: Secondary | ICD-10-CM | POA: Diagnosis not present

## 2018-11-14 DIAGNOSIS — R509 Fever, unspecified: Secondary | ICD-10-CM | POA: Diagnosis not present

## 2018-11-15 DIAGNOSIS — N3001 Acute cystitis with hematuria: Secondary | ICD-10-CM | POA: Diagnosis not present

## 2018-12-06 DIAGNOSIS — I10 Essential (primary) hypertension: Secondary | ICD-10-CM | POA: Diagnosis not present

## 2018-12-06 DIAGNOSIS — R339 Retention of urine, unspecified: Secondary | ICD-10-CM | POA: Diagnosis not present

## 2018-12-06 DIAGNOSIS — R296 Repeated falls: Secondary | ICD-10-CM | POA: Diagnosis not present

## 2018-12-07 DIAGNOSIS — R339 Retention of urine, unspecified: Secondary | ICD-10-CM | POA: Diagnosis not present

## 2018-12-07 DIAGNOSIS — F419 Anxiety disorder, unspecified: Secondary | ICD-10-CM | POA: Diagnosis not present

## 2018-12-07 DIAGNOSIS — N319 Neuromuscular dysfunction of bladder, unspecified: Secondary | ICD-10-CM | POA: Diagnosis not present

## 2018-12-07 DIAGNOSIS — G8221 Paraplegia, complete: Secondary | ICD-10-CM | POA: Diagnosis not present

## 2018-12-14 DIAGNOSIS — N210 Calculus in bladder: Secondary | ICD-10-CM | POA: Diagnosis not present

## 2018-12-14 DIAGNOSIS — Z466 Encounter for fitting and adjustment of urinary device: Secondary | ICD-10-CM | POA: Diagnosis not present

## 2018-12-14 DIAGNOSIS — N368 Other specified disorders of urethra: Secondary | ICD-10-CM | POA: Diagnosis not present

## 2018-12-14 DIAGNOSIS — S3121XA Laceration without foreign body of penis, initial encounter: Secondary | ICD-10-CM | POA: Diagnosis not present

## 2018-12-14 DIAGNOSIS — N139 Obstructive and reflux uropathy, unspecified: Secondary | ICD-10-CM | POA: Diagnosis not present

## 2018-12-14 DIAGNOSIS — I10 Essential (primary) hypertension: Secondary | ICD-10-CM | POA: Diagnosis not present

## 2018-12-14 DIAGNOSIS — R58 Hemorrhage, not elsewhere classified: Secondary | ICD-10-CM | POA: Diagnosis not present

## 2018-12-14 DIAGNOSIS — G8250 Quadriplegia, unspecified: Secondary | ICD-10-CM | POA: Diagnosis not present

## 2018-12-15 DIAGNOSIS — N319 Neuromuscular dysfunction of bladder, unspecified: Secondary | ICD-10-CM | POA: Diagnosis not present

## 2018-12-15 DIAGNOSIS — I10 Essential (primary) hypertension: Secondary | ICD-10-CM | POA: Diagnosis not present

## 2018-12-15 DIAGNOSIS — N210 Calculus in bladder: Secondary | ICD-10-CM | POA: Diagnosis not present

## 2018-12-15 DIAGNOSIS — F332 Major depressive disorder, recurrent severe without psychotic features: Secondary | ICD-10-CM | POA: Diagnosis not present

## 2018-12-15 DIAGNOSIS — N209 Urinary calculus, unspecified: Secondary | ICD-10-CM | POA: Diagnosis not present

## 2018-12-16 DIAGNOSIS — Z743 Need for continuous supervision: Secondary | ICD-10-CM | POA: Diagnosis not present

## 2018-12-16 DIAGNOSIS — N319 Neuromuscular dysfunction of bladder, unspecified: Secondary | ICD-10-CM | POA: Diagnosis present

## 2018-12-16 DIAGNOSIS — Z8701 Personal history of pneumonia (recurrent): Secondary | ICD-10-CM | POA: Diagnosis not present

## 2018-12-16 DIAGNOSIS — Z79899 Other long term (current) drug therapy: Secondary | ICD-10-CM | POA: Diagnosis not present

## 2018-12-16 DIAGNOSIS — R339 Retention of urine, unspecified: Secondary | ICD-10-CM | POA: Diagnosis not present

## 2018-12-16 DIAGNOSIS — R279 Unspecified lack of coordination: Secondary | ICD-10-CM | POA: Diagnosis not present

## 2018-12-16 DIAGNOSIS — I4891 Unspecified atrial fibrillation: Secondary | ICD-10-CM | POA: Diagnosis not present

## 2018-12-16 DIAGNOSIS — Z885 Allergy status to narcotic agent status: Secondary | ICD-10-CM | POA: Diagnosis not present

## 2018-12-16 DIAGNOSIS — I10 Essential (primary) hypertension: Secondary | ICD-10-CM | POA: Diagnosis present

## 2018-12-16 DIAGNOSIS — N210 Calculus in bladder: Secondary | ICD-10-CM | POA: Diagnosis not present

## 2018-12-16 DIAGNOSIS — F418 Other specified anxiety disorders: Secondary | ICD-10-CM | POA: Diagnosis present

## 2018-12-16 DIAGNOSIS — Z87440 Personal history of urinary (tract) infections: Secondary | ICD-10-CM | POA: Diagnosis not present

## 2018-12-16 DIAGNOSIS — G8250 Quadriplegia, unspecified: Secondary | ICD-10-CM | POA: Diagnosis present

## 2018-12-16 DIAGNOSIS — N368 Other specified disorders of urethra: Secondary | ICD-10-CM | POA: Diagnosis not present

## 2018-12-17 DIAGNOSIS — N319 Neuromuscular dysfunction of bladder, unspecified: Secondary | ICD-10-CM | POA: Diagnosis not present

## 2018-12-17 DIAGNOSIS — N200 Calculus of kidney: Secondary | ICD-10-CM | POA: Diagnosis not present

## 2018-12-17 DIAGNOSIS — R339 Retention of urine, unspecified: Secondary | ICD-10-CM | POA: Diagnosis not present

## 2018-12-17 DIAGNOSIS — G8221 Paraplegia, complete: Secondary | ICD-10-CM | POA: Diagnosis not present

## 2018-12-21 DIAGNOSIS — D649 Anemia, unspecified: Secondary | ICD-10-CM | POA: Diagnosis not present

## 2018-12-21 DIAGNOSIS — N390 Urinary tract infection, site not specified: Secondary | ICD-10-CM | POA: Diagnosis not present

## 2018-12-21 DIAGNOSIS — I10 Essential (primary) hypertension: Secondary | ICD-10-CM | POA: Diagnosis not present

## 2018-12-23 DIAGNOSIS — F411 Generalized anxiety disorder: Secondary | ICD-10-CM | POA: Diagnosis not present

## 2018-12-23 DIAGNOSIS — F320 Major depressive disorder, single episode, mild: Secondary | ICD-10-CM | POA: Diagnosis not present

## 2018-12-23 DIAGNOSIS — R4189 Other symptoms and signs involving cognitive functions and awareness: Secondary | ICD-10-CM | POA: Diagnosis not present

## 2018-12-24 DIAGNOSIS — F331 Major depressive disorder, recurrent, moderate: Secondary | ICD-10-CM | POA: Diagnosis not present

## 2018-12-24 DIAGNOSIS — G4709 Other insomnia: Secondary | ICD-10-CM | POA: Diagnosis not present

## 2018-12-24 DIAGNOSIS — F419 Anxiety disorder, unspecified: Secondary | ICD-10-CM | POA: Diagnosis not present

## 2018-12-29 DIAGNOSIS — R0902 Hypoxemia: Secondary | ICD-10-CM | POA: Diagnosis not present

## 2018-12-29 DIAGNOSIS — N390 Urinary tract infection, site not specified: Secondary | ICD-10-CM | POA: Diagnosis not present

## 2018-12-29 DIAGNOSIS — Z87440 Personal history of urinary (tract) infections: Secondary | ICD-10-CM | POA: Diagnosis not present

## 2018-12-29 DIAGNOSIS — I10 Essential (primary) hypertension: Secondary | ICD-10-CM | POA: Diagnosis not present

## 2018-12-29 DIAGNOSIS — B9689 Other specified bacterial agents as the cause of diseases classified elsewhere: Secondary | ICD-10-CM | POA: Diagnosis not present

## 2018-12-29 DIAGNOSIS — G8220 Paraplegia, unspecified: Secondary | ICD-10-CM | POA: Diagnosis not present

## 2018-12-29 DIAGNOSIS — Z7401 Bed confinement status: Secondary | ICD-10-CM | POA: Diagnosis not present

## 2018-12-29 DIAGNOSIS — Z466 Encounter for fitting and adjustment of urinary device: Secondary | ICD-10-CM | POA: Diagnosis not present

## 2018-12-29 DIAGNOSIS — I4891 Unspecified atrial fibrillation: Secondary | ICD-10-CM | POA: Diagnosis not present

## 2018-12-29 DIAGNOSIS — M2550 Pain in unspecified joint: Secondary | ICD-10-CM | POA: Diagnosis not present

## 2018-12-31 DIAGNOSIS — N390 Urinary tract infection, site not specified: Secondary | ICD-10-CM | POA: Diagnosis not present

## 2018-12-31 DIAGNOSIS — N319 Neuromuscular dysfunction of bladder, unspecified: Secondary | ICD-10-CM | POA: Diagnosis not present

## 2018-12-31 DIAGNOSIS — G8221 Paraplegia, complete: Secondary | ICD-10-CM | POA: Diagnosis not present

## 2018-12-31 DIAGNOSIS — R339 Retention of urine, unspecified: Secondary | ICD-10-CM | POA: Diagnosis not present

## 2019-01-04 DIAGNOSIS — G8221 Paraplegia, complete: Secondary | ICD-10-CM | POA: Diagnosis not present

## 2019-01-04 DIAGNOSIS — N319 Neuromuscular dysfunction of bladder, unspecified: Secondary | ICD-10-CM | POA: Diagnosis not present

## 2019-01-04 DIAGNOSIS — R339 Retention of urine, unspecified: Secondary | ICD-10-CM | POA: Diagnosis not present

## 2019-01-04 DIAGNOSIS — N390 Urinary tract infection, site not specified: Secondary | ICD-10-CM | POA: Diagnosis not present

## 2019-01-05 DIAGNOSIS — F17210 Nicotine dependence, cigarettes, uncomplicated: Secondary | ICD-10-CM | POA: Diagnosis not present

## 2019-01-05 DIAGNOSIS — Z87820 Personal history of traumatic brain injury: Secondary | ICD-10-CM | POA: Diagnosis not present

## 2019-01-05 DIAGNOSIS — R410 Disorientation, unspecified: Secondary | ICD-10-CM | POA: Diagnosis not present

## 2019-01-05 DIAGNOSIS — Q549 Hypospadias, unspecified: Secondary | ICD-10-CM | POA: Diagnosis not present

## 2019-01-05 DIAGNOSIS — Z978 Presence of other specified devices: Secondary | ICD-10-CM | POA: Diagnosis not present

## 2019-01-05 DIAGNOSIS — N319 Neuromuscular dysfunction of bladder, unspecified: Secondary | ICD-10-CM | POA: Diagnosis not present

## 2019-01-05 DIAGNOSIS — N368 Other specified disorders of urethra: Secondary | ICD-10-CM | POA: Diagnosis not present

## 2019-01-05 DIAGNOSIS — G8250 Quadriplegia, unspecified: Secondary | ICD-10-CM | POA: Diagnosis not present

## 2019-01-05 DIAGNOSIS — Z79899 Other long term (current) drug therapy: Secondary | ICD-10-CM | POA: Diagnosis not present

## 2019-01-05 DIAGNOSIS — N3090 Cystitis, unspecified without hematuria: Secondary | ICD-10-CM | POA: Diagnosis not present

## 2019-01-05 DIAGNOSIS — I4891 Unspecified atrial fibrillation: Secondary | ICD-10-CM | POA: Diagnosis not present

## 2019-01-05 DIAGNOSIS — Z7401 Bed confinement status: Secondary | ICD-10-CM | POA: Diagnosis not present

## 2019-01-05 DIAGNOSIS — I10 Essential (primary) hypertension: Secondary | ICD-10-CM | POA: Diagnosis not present

## 2019-01-05 DIAGNOSIS — N210 Calculus in bladder: Secondary | ICD-10-CM | POA: Diagnosis not present

## 2019-01-05 DIAGNOSIS — G8220 Paraplegia, unspecified: Secondary | ICD-10-CM | POA: Diagnosis not present

## 2019-01-05 DIAGNOSIS — G4700 Insomnia, unspecified: Secondary | ICD-10-CM | POA: Diagnosis not present

## 2019-01-05 DIAGNOSIS — M2550 Pain in unspecified joint: Secondary | ICD-10-CM | POA: Diagnosis not present

## 2019-01-09 DIAGNOSIS — N368 Other specified disorders of urethra: Secondary | ICD-10-CM | POA: Diagnosis not present

## 2019-01-09 DIAGNOSIS — I4891 Unspecified atrial fibrillation: Secondary | ICD-10-CM | POA: Diagnosis not present

## 2019-01-09 DIAGNOSIS — I10 Essential (primary) hypertension: Secondary | ICD-10-CM | POA: Diagnosis not present

## 2019-01-11 DIAGNOSIS — Z03818 Encounter for observation for suspected exposure to other biological agents ruled out: Secondary | ICD-10-CM | POA: Diagnosis not present

## 2019-01-13 DIAGNOSIS — I10 Essential (primary) hypertension: Secondary | ICD-10-CM | POA: Diagnosis not present

## 2019-01-13 DIAGNOSIS — F332 Major depressive disorder, recurrent severe without psychotic features: Secondary | ICD-10-CM | POA: Diagnosis not present

## 2019-01-15 DIAGNOSIS — N202 Calculus of kidney with calculus of ureter: Secondary | ICD-10-CM | POA: Diagnosis not present

## 2019-01-15 DIAGNOSIS — Z7401 Bed confinement status: Secondary | ICD-10-CM | POA: Diagnosis not present

## 2019-01-15 DIAGNOSIS — N200 Calculus of kidney: Secondary | ICD-10-CM | POA: Diagnosis not present

## 2019-01-15 DIAGNOSIS — M2550 Pain in unspecified joint: Secondary | ICD-10-CM | POA: Diagnosis not present

## 2019-01-25 DIAGNOSIS — Z03818 Encounter for observation for suspected exposure to other biological agents ruled out: Secondary | ICD-10-CM | POA: Diagnosis not present

## 2019-01-27 DIAGNOSIS — F332 Major depressive disorder, recurrent severe without psychotic features: Secondary | ICD-10-CM | POA: Diagnosis not present

## 2019-01-27 DIAGNOSIS — I10 Essential (primary) hypertension: Secondary | ICD-10-CM | POA: Diagnosis not present

## 2019-02-01 DIAGNOSIS — Z03818 Encounter for observation for suspected exposure to other biological agents ruled out: Secondary | ICD-10-CM | POA: Diagnosis not present

## 2019-02-02 DIAGNOSIS — R4189 Other symptoms and signs involving cognitive functions and awareness: Secondary | ICD-10-CM | POA: Diagnosis not present

## 2019-02-02 DIAGNOSIS — F320 Major depressive disorder, single episode, mild: Secondary | ICD-10-CM | POA: Diagnosis not present

## 2019-02-02 DIAGNOSIS — F411 Generalized anxiety disorder: Secondary | ICD-10-CM | POA: Diagnosis not present

## 2019-02-04 DIAGNOSIS — N319 Neuromuscular dysfunction of bladder, unspecified: Secondary | ICD-10-CM | POA: Diagnosis not present

## 2019-02-04 DIAGNOSIS — I10 Essential (primary) hypertension: Secondary | ICD-10-CM | POA: Diagnosis not present

## 2019-02-04 DIAGNOSIS — R339 Retention of urine, unspecified: Secondary | ICD-10-CM | POA: Diagnosis not present

## 2019-02-04 DIAGNOSIS — G8221 Paraplegia, complete: Secondary | ICD-10-CM | POA: Diagnosis not present

## 2019-02-08 DIAGNOSIS — Z03818 Encounter for observation for suspected exposure to other biological agents ruled out: Secondary | ICD-10-CM | POA: Diagnosis not present

## 2019-02-11 DIAGNOSIS — G4709 Other insomnia: Secondary | ICD-10-CM | POA: Diagnosis not present

## 2019-02-11 DIAGNOSIS — F331 Major depressive disorder, recurrent, moderate: Secondary | ICD-10-CM | POA: Diagnosis not present

## 2019-02-11 DIAGNOSIS — F419 Anxiety disorder, unspecified: Secondary | ICD-10-CM | POA: Diagnosis not present

## 2019-02-15 DIAGNOSIS — Z03818 Encounter for observation for suspected exposure to other biological agents ruled out: Secondary | ICD-10-CM | POA: Diagnosis not present

## 2019-02-22 DIAGNOSIS — Z03818 Encounter for observation for suspected exposure to other biological agents ruled out: Secondary | ICD-10-CM | POA: Diagnosis not present

## 2019-02-26 DIAGNOSIS — I10 Essential (primary) hypertension: Secondary | ICD-10-CM | POA: Diagnosis not present

## 2019-02-26 DIAGNOSIS — F332 Major depressive disorder, recurrent severe without psychotic features: Secondary | ICD-10-CM | POA: Diagnosis not present

## 2019-03-08 DIAGNOSIS — F329 Major depressive disorder, single episode, unspecified: Secondary | ICD-10-CM | POA: Diagnosis not present

## 2019-03-08 DIAGNOSIS — R339 Retention of urine, unspecified: Secondary | ICD-10-CM | POA: Diagnosis not present

## 2019-03-08 DIAGNOSIS — G8221 Paraplegia, complete: Secondary | ICD-10-CM | POA: Diagnosis not present

## 2019-03-08 DIAGNOSIS — N319 Neuromuscular dysfunction of bladder, unspecified: Secondary | ICD-10-CM | POA: Diagnosis not present

## 2019-03-09 DIAGNOSIS — F320 Major depressive disorder, single episode, mild: Secondary | ICD-10-CM | POA: Diagnosis not present

## 2019-03-09 DIAGNOSIS — R4189 Other symptoms and signs involving cognitive functions and awareness: Secondary | ICD-10-CM | POA: Diagnosis not present

## 2019-03-09 DIAGNOSIS — F411 Generalized anxiety disorder: Secondary | ICD-10-CM | POA: Diagnosis not present

## 2019-03-25 DIAGNOSIS — G8221 Paraplegia, complete: Secondary | ICD-10-CM | POA: Diagnosis not present

## 2019-03-25 DIAGNOSIS — F419 Anxiety disorder, unspecified: Secondary | ICD-10-CM | POA: Diagnosis not present

## 2019-03-25 DIAGNOSIS — L219 Seborrheic dermatitis, unspecified: Secondary | ICD-10-CM | POA: Diagnosis not present

## 2019-03-25 DIAGNOSIS — L299 Pruritus, unspecified: Secondary | ICD-10-CM | POA: Diagnosis not present

## 2019-04-05 DIAGNOSIS — N319 Neuromuscular dysfunction of bladder, unspecified: Secondary | ICD-10-CM | POA: Diagnosis not present

## 2019-04-05 DIAGNOSIS — G8221 Paraplegia, complete: Secondary | ICD-10-CM | POA: Diagnosis not present

## 2019-04-05 DIAGNOSIS — F419 Anxiety disorder, unspecified: Secondary | ICD-10-CM | POA: Diagnosis not present

## 2019-04-05 DIAGNOSIS — L219 Seborrheic dermatitis, unspecified: Secondary | ICD-10-CM | POA: Diagnosis not present

## 2019-04-08 DIAGNOSIS — M150 Primary generalized (osteo)arthritis: Secondary | ICD-10-CM | POA: Diagnosis not present

## 2019-04-08 DIAGNOSIS — M24541 Contracture, right hand: Secondary | ICD-10-CM | POA: Diagnosis not present

## 2019-04-08 DIAGNOSIS — G8221 Paraplegia, complete: Secondary | ICD-10-CM | POA: Diagnosis not present

## 2019-04-12 DIAGNOSIS — M150 Primary generalized (osteo)arthritis: Secondary | ICD-10-CM | POA: Diagnosis not present

## 2019-04-12 DIAGNOSIS — M24541 Contracture, right hand: Secondary | ICD-10-CM | POA: Diagnosis not present

## 2019-04-12 DIAGNOSIS — G8221 Paraplegia, complete: Secondary | ICD-10-CM | POA: Diagnosis not present

## 2019-04-13 DIAGNOSIS — G8221 Paraplegia, complete: Secondary | ICD-10-CM | POA: Diagnosis not present

## 2019-04-13 DIAGNOSIS — M150 Primary generalized (osteo)arthritis: Secondary | ICD-10-CM | POA: Diagnosis not present

## 2019-04-13 DIAGNOSIS — M24541 Contracture, right hand: Secondary | ICD-10-CM | POA: Diagnosis not present

## 2019-04-14 DIAGNOSIS — G8221 Paraplegia, complete: Secondary | ICD-10-CM | POA: Diagnosis not present

## 2019-04-14 DIAGNOSIS — M24541 Contracture, right hand: Secondary | ICD-10-CM | POA: Diagnosis not present

## 2019-04-14 DIAGNOSIS — M150 Primary generalized (osteo)arthritis: Secondary | ICD-10-CM | POA: Diagnosis not present

## 2019-04-17 DIAGNOSIS — G8221 Paraplegia, complete: Secondary | ICD-10-CM | POA: Diagnosis not present

## 2019-04-17 DIAGNOSIS — M150 Primary generalized (osteo)arthritis: Secondary | ICD-10-CM | POA: Diagnosis not present

## 2019-04-17 DIAGNOSIS — M24541 Contracture, right hand: Secondary | ICD-10-CM | POA: Diagnosis not present

## 2019-04-19 DIAGNOSIS — M24541 Contracture, right hand: Secondary | ICD-10-CM | POA: Diagnosis not present

## 2019-04-19 DIAGNOSIS — G8221 Paraplegia, complete: Secondary | ICD-10-CM | POA: Diagnosis not present

## 2019-04-19 DIAGNOSIS — M150 Primary generalized (osteo)arthritis: Secondary | ICD-10-CM | POA: Diagnosis not present

## 2019-04-20 DIAGNOSIS — M150 Primary generalized (osteo)arthritis: Secondary | ICD-10-CM | POA: Diagnosis not present

## 2019-04-20 DIAGNOSIS — M24541 Contracture, right hand: Secondary | ICD-10-CM | POA: Diagnosis not present

## 2019-04-20 DIAGNOSIS — G8221 Paraplegia, complete: Secondary | ICD-10-CM | POA: Diagnosis not present

## 2019-04-21 DIAGNOSIS — M24541 Contracture, right hand: Secondary | ICD-10-CM | POA: Diagnosis not present

## 2019-04-21 DIAGNOSIS — G8221 Paraplegia, complete: Secondary | ICD-10-CM | POA: Diagnosis not present

## 2019-04-21 DIAGNOSIS — M150 Primary generalized (osteo)arthritis: Secondary | ICD-10-CM | POA: Diagnosis not present

## 2019-04-22 DIAGNOSIS — G8221 Paraplegia, complete: Secondary | ICD-10-CM | POA: Diagnosis not present

## 2019-04-22 DIAGNOSIS — M150 Primary generalized (osteo)arthritis: Secondary | ICD-10-CM | POA: Diagnosis not present

## 2019-04-22 DIAGNOSIS — M24541 Contracture, right hand: Secondary | ICD-10-CM | POA: Diagnosis not present

## 2019-04-23 DIAGNOSIS — M150 Primary generalized (osteo)arthritis: Secondary | ICD-10-CM | POA: Diagnosis not present

## 2019-04-23 DIAGNOSIS — M24541 Contracture, right hand: Secondary | ICD-10-CM | POA: Diagnosis not present

## 2019-04-23 DIAGNOSIS — G8221 Paraplegia, complete: Secondary | ICD-10-CM | POA: Diagnosis not present

## 2019-04-26 DIAGNOSIS — G8221 Paraplegia, complete: Secondary | ICD-10-CM | POA: Diagnosis not present

## 2019-04-26 DIAGNOSIS — M24541 Contracture, right hand: Secondary | ICD-10-CM | POA: Diagnosis not present

## 2019-04-26 DIAGNOSIS — M150 Primary generalized (osteo)arthritis: Secondary | ICD-10-CM | POA: Diagnosis not present

## 2019-04-27 DIAGNOSIS — M150 Primary generalized (osteo)arthritis: Secondary | ICD-10-CM | POA: Diagnosis not present

## 2019-04-27 DIAGNOSIS — G8221 Paraplegia, complete: Secondary | ICD-10-CM | POA: Diagnosis not present

## 2019-04-27 DIAGNOSIS — M24541 Contracture, right hand: Secondary | ICD-10-CM | POA: Diagnosis not present

## 2019-04-28 DIAGNOSIS — M24541 Contracture, right hand: Secondary | ICD-10-CM | POA: Diagnosis not present

## 2019-04-28 DIAGNOSIS — G8221 Paraplegia, complete: Secondary | ICD-10-CM | POA: Diagnosis not present

## 2019-04-28 DIAGNOSIS — M150 Primary generalized (osteo)arthritis: Secondary | ICD-10-CM | POA: Diagnosis not present

## 2019-04-29 DIAGNOSIS — M24541 Contracture, right hand: Secondary | ICD-10-CM | POA: Diagnosis not present

## 2019-04-29 DIAGNOSIS — M150 Primary generalized (osteo)arthritis: Secondary | ICD-10-CM | POA: Diagnosis not present

## 2019-04-29 DIAGNOSIS — G8221 Paraplegia, complete: Secondary | ICD-10-CM | POA: Diagnosis not present

## 2019-04-30 DIAGNOSIS — G8221 Paraplegia, complete: Secondary | ICD-10-CM | POA: Diagnosis not present

## 2019-04-30 DIAGNOSIS — F332 Major depressive disorder, recurrent severe without psychotic features: Secondary | ICD-10-CM | POA: Diagnosis not present

## 2019-04-30 DIAGNOSIS — M150 Primary generalized (osteo)arthritis: Secondary | ICD-10-CM | POA: Diagnosis not present

## 2019-04-30 DIAGNOSIS — M24541 Contracture, right hand: Secondary | ICD-10-CM | POA: Diagnosis not present

## 2019-04-30 DIAGNOSIS — I10 Essential (primary) hypertension: Secondary | ICD-10-CM | POA: Diagnosis not present

## 2019-05-01 DIAGNOSIS — F320 Major depressive disorder, single episode, mild: Secondary | ICD-10-CM | POA: Diagnosis not present

## 2019-05-01 DIAGNOSIS — R4189 Other symptoms and signs involving cognitive functions and awareness: Secondary | ICD-10-CM | POA: Diagnosis not present

## 2019-05-01 DIAGNOSIS — F411 Generalized anxiety disorder: Secondary | ICD-10-CM | POA: Diagnosis not present

## 2019-05-03 DIAGNOSIS — G8221 Paraplegia, complete: Secondary | ICD-10-CM | POA: Diagnosis not present

## 2019-05-03 DIAGNOSIS — M24541 Contracture, right hand: Secondary | ICD-10-CM | POA: Diagnosis not present

## 2019-05-03 DIAGNOSIS — M150 Primary generalized (osteo)arthritis: Secondary | ICD-10-CM | POA: Diagnosis not present

## 2019-05-04 DIAGNOSIS — M150 Primary generalized (osteo)arthritis: Secondary | ICD-10-CM | POA: Diagnosis not present

## 2019-05-04 DIAGNOSIS — G8221 Paraplegia, complete: Secondary | ICD-10-CM | POA: Diagnosis not present

## 2019-05-04 DIAGNOSIS — M24541 Contracture, right hand: Secondary | ICD-10-CM | POA: Diagnosis not present

## 2019-05-05 DIAGNOSIS — G4709 Other insomnia: Secondary | ICD-10-CM | POA: Diagnosis not present

## 2019-05-05 DIAGNOSIS — G8221 Paraplegia, complete: Secondary | ICD-10-CM | POA: Diagnosis not present

## 2019-05-05 DIAGNOSIS — M24541 Contracture, right hand: Secondary | ICD-10-CM | POA: Diagnosis not present

## 2019-05-05 DIAGNOSIS — M150 Primary generalized (osteo)arthritis: Secondary | ICD-10-CM | POA: Diagnosis not present

## 2019-05-05 DIAGNOSIS — F331 Major depressive disorder, recurrent, moderate: Secondary | ICD-10-CM | POA: Diagnosis not present

## 2019-05-05 DIAGNOSIS — F419 Anxiety disorder, unspecified: Secondary | ICD-10-CM | POA: Diagnosis not present

## 2019-05-06 DIAGNOSIS — M150 Primary generalized (osteo)arthritis: Secondary | ICD-10-CM | POA: Diagnosis not present

## 2019-05-06 DIAGNOSIS — G8221 Paraplegia, complete: Secondary | ICD-10-CM | POA: Diagnosis not present

## 2019-05-06 DIAGNOSIS — M24541 Contracture, right hand: Secondary | ICD-10-CM | POA: Diagnosis not present

## 2019-05-10 DIAGNOSIS — G8221 Paraplegia, complete: Secondary | ICD-10-CM | POA: Diagnosis not present

## 2019-05-10 DIAGNOSIS — M24541 Contracture, right hand: Secondary | ICD-10-CM | POA: Diagnosis not present

## 2019-05-10 DIAGNOSIS — M150 Primary generalized (osteo)arthritis: Secondary | ICD-10-CM | POA: Diagnosis not present

## 2019-05-11 DIAGNOSIS — M150 Primary generalized (osteo)arthritis: Secondary | ICD-10-CM | POA: Diagnosis not present

## 2019-05-11 DIAGNOSIS — G8221 Paraplegia, complete: Secondary | ICD-10-CM | POA: Diagnosis not present

## 2019-05-11 DIAGNOSIS — M24541 Contracture, right hand: Secondary | ICD-10-CM | POA: Diagnosis not present

## 2019-05-12 DIAGNOSIS — G8221 Paraplegia, complete: Secondary | ICD-10-CM | POA: Diagnosis not present

## 2019-05-12 DIAGNOSIS — M24541 Contracture, right hand: Secondary | ICD-10-CM | POA: Diagnosis not present

## 2019-05-12 DIAGNOSIS — M150 Primary generalized (osteo)arthritis: Secondary | ICD-10-CM | POA: Diagnosis not present

## 2019-05-13 DIAGNOSIS — G8221 Paraplegia, complete: Secondary | ICD-10-CM | POA: Diagnosis not present

## 2019-05-13 DIAGNOSIS — M150 Primary generalized (osteo)arthritis: Secondary | ICD-10-CM | POA: Diagnosis not present

## 2019-05-13 DIAGNOSIS — M24541 Contracture, right hand: Secondary | ICD-10-CM | POA: Diagnosis not present

## 2019-05-14 DIAGNOSIS — M24541 Contracture, right hand: Secondary | ICD-10-CM | POA: Diagnosis not present

## 2019-05-14 DIAGNOSIS — M150 Primary generalized (osteo)arthritis: Secondary | ICD-10-CM | POA: Diagnosis not present

## 2019-05-14 DIAGNOSIS — G8221 Paraplegia, complete: Secondary | ICD-10-CM | POA: Diagnosis not present

## 2019-05-25 DIAGNOSIS — F411 Generalized anxiety disorder: Secondary | ICD-10-CM | POA: Diagnosis not present

## 2019-05-25 DIAGNOSIS — R4189 Other symptoms and signs involving cognitive functions and awareness: Secondary | ICD-10-CM | POA: Diagnosis not present

## 2019-05-25 DIAGNOSIS — F320 Major depressive disorder, single episode, mild: Secondary | ICD-10-CM | POA: Diagnosis not present

## 2019-05-28 DIAGNOSIS — Z23 Encounter for immunization: Secondary | ICD-10-CM | POA: Diagnosis not present

## 2019-06-01 DIAGNOSIS — I10 Essential (primary) hypertension: Secondary | ICD-10-CM | POA: Diagnosis not present

## 2019-06-01 DIAGNOSIS — F332 Major depressive disorder, recurrent severe without psychotic features: Secondary | ICD-10-CM | POA: Diagnosis not present

## 2019-06-02 DIAGNOSIS — R339 Retention of urine, unspecified: Secondary | ICD-10-CM | POA: Diagnosis not present

## 2019-06-02 DIAGNOSIS — M150 Primary generalized (osteo)arthritis: Secondary | ICD-10-CM | POA: Diagnosis not present

## 2019-06-02 DIAGNOSIS — I10 Essential (primary) hypertension: Secondary | ICD-10-CM | POA: Diagnosis not present

## 2019-06-02 DIAGNOSIS — F419 Anxiety disorder, unspecified: Secondary | ICD-10-CM | POA: Diagnosis not present

## 2019-06-02 DIAGNOSIS — G4709 Other insomnia: Secondary | ICD-10-CM | POA: Diagnosis not present

## 2019-06-02 DIAGNOSIS — I480 Paroxysmal atrial fibrillation: Secondary | ICD-10-CM | POA: Diagnosis not present

## 2019-06-02 DIAGNOSIS — F331 Major depressive disorder, recurrent, moderate: Secondary | ICD-10-CM | POA: Diagnosis not present

## 2019-06-30 DIAGNOSIS — F419 Anxiety disorder, unspecified: Secondary | ICD-10-CM | POA: Diagnosis not present

## 2019-06-30 DIAGNOSIS — G4709 Other insomnia: Secondary | ICD-10-CM | POA: Diagnosis not present

## 2019-06-30 DIAGNOSIS — F331 Major depressive disorder, recurrent, moderate: Secondary | ICD-10-CM | POA: Diagnosis not present

## 2019-07-01 DIAGNOSIS — I10 Essential (primary) hypertension: Secondary | ICD-10-CM | POA: Diagnosis not present

## 2019-07-01 DIAGNOSIS — N319 Neuromuscular dysfunction of bladder, unspecified: Secondary | ICD-10-CM | POA: Diagnosis not present

## 2019-07-01 DIAGNOSIS — I480 Paroxysmal atrial fibrillation: Secondary | ICD-10-CM | POA: Diagnosis not present

## 2019-07-01 DIAGNOSIS — R339 Retention of urine, unspecified: Secondary | ICD-10-CM | POA: Diagnosis not present

## 2019-07-06 DIAGNOSIS — F320 Major depressive disorder, single episode, mild: Secondary | ICD-10-CM | POA: Diagnosis not present

## 2019-07-06 DIAGNOSIS — F411 Generalized anxiety disorder: Secondary | ICD-10-CM | POA: Diagnosis not present

## 2019-07-06 DIAGNOSIS — R4189 Other symptoms and signs involving cognitive functions and awareness: Secondary | ICD-10-CM | POA: Diagnosis not present

## 2019-07-09 DIAGNOSIS — I480 Paroxysmal atrial fibrillation: Secondary | ICD-10-CM | POA: Diagnosis not present

## 2019-07-09 DIAGNOSIS — F332 Major depressive disorder, recurrent severe without psychotic features: Secondary | ICD-10-CM | POA: Diagnosis not present

## 2019-07-09 DIAGNOSIS — I10 Essential (primary) hypertension: Secondary | ICD-10-CM | POA: Diagnosis not present

## 2019-07-31 DIAGNOSIS — F332 Major depressive disorder, recurrent severe without psychotic features: Secondary | ICD-10-CM | POA: Diagnosis not present

## 2019-07-31 DIAGNOSIS — I480 Paroxysmal atrial fibrillation: Secondary | ICD-10-CM | POA: Diagnosis not present

## 2019-07-31 DIAGNOSIS — I10 Essential (primary) hypertension: Secondary | ICD-10-CM | POA: Diagnosis not present

## 2019-08-05 DIAGNOSIS — G8221 Paraplegia, complete: Secondary | ICD-10-CM | POA: Diagnosis not present

## 2019-08-05 DIAGNOSIS — L219 Seborrheic dermatitis, unspecified: Secondary | ICD-10-CM | POA: Diagnosis not present

## 2019-08-05 DIAGNOSIS — N319 Neuromuscular dysfunction of bladder, unspecified: Secondary | ICD-10-CM | POA: Diagnosis not present

## 2019-08-05 DIAGNOSIS — R339 Retention of urine, unspecified: Secondary | ICD-10-CM | POA: Diagnosis not present

## 2019-08-10 DIAGNOSIS — F419 Anxiety disorder, unspecified: Secondary | ICD-10-CM | POA: Diagnosis not present

## 2019-08-10 DIAGNOSIS — F331 Major depressive disorder, recurrent, moderate: Secondary | ICD-10-CM | POA: Diagnosis not present

## 2019-08-10 DIAGNOSIS — G4700 Insomnia, unspecified: Secondary | ICD-10-CM | POA: Diagnosis not present

## 2019-08-10 DIAGNOSIS — R4189 Other symptoms and signs involving cognitive functions and awareness: Secondary | ICD-10-CM | POA: Diagnosis not present

## 2019-08-30 DIAGNOSIS — F331 Major depressive disorder, recurrent, moderate: Secondary | ICD-10-CM | POA: Diagnosis not present

## 2019-08-30 DIAGNOSIS — G4709 Other insomnia: Secondary | ICD-10-CM | POA: Diagnosis not present

## 2019-08-30 DIAGNOSIS — F419 Anxiety disorder, unspecified: Secondary | ICD-10-CM | POA: Diagnosis not present

## 2019-09-22 DIAGNOSIS — R52 Pain, unspecified: Secondary | ICD-10-CM | POA: Diagnosis not present

## 2019-09-22 DIAGNOSIS — F329 Major depressive disorder, single episode, unspecified: Secondary | ICD-10-CM | POA: Diagnosis not present

## 2019-09-22 DIAGNOSIS — N3281 Overactive bladder: Secondary | ICD-10-CM | POA: Diagnosis not present

## 2019-09-27 DIAGNOSIS — F331 Major depressive disorder, recurrent, moderate: Secondary | ICD-10-CM | POA: Diagnosis not present

## 2019-09-27 DIAGNOSIS — G4709 Other insomnia: Secondary | ICD-10-CM | POA: Diagnosis not present

## 2019-09-27 DIAGNOSIS — F419 Anxiety disorder, unspecified: Secondary | ICD-10-CM | POA: Diagnosis not present

## 2019-11-29 DIAGNOSIS — F419 Anxiety disorder, unspecified: Secondary | ICD-10-CM | POA: Diagnosis not present

## 2019-11-29 DIAGNOSIS — G4709 Other insomnia: Secondary | ICD-10-CM | POA: Diagnosis not present

## 2019-11-29 DIAGNOSIS — F331 Major depressive disorder, recurrent, moderate: Secondary | ICD-10-CM | POA: Diagnosis not present

## 2020-05-23 DEATH — deceased
